# Patient Record
Sex: Male | Born: 1960
Health system: Southern US, Community
[De-identification: ages and names within clinical notes are randomized; demographics above are authoritative.]

## PROBLEM LIST (undated history)

## (undated) DIAGNOSIS — T7840XA Allergy, unspecified, initial encounter: Secondary | ICD-10-CM

## (undated) DIAGNOSIS — K6289 Other specified diseases of anus and rectum: Secondary | ICD-10-CM

## (undated) DIAGNOSIS — E785 Hyperlipidemia, unspecified: Secondary | ICD-10-CM

## (undated) DIAGNOSIS — N400 Enlarged prostate without lower urinary tract symptoms: Secondary | ICD-10-CM

## (undated) DIAGNOSIS — G709 Myoneural disorder, unspecified: Secondary | ICD-10-CM

## (undated) DIAGNOSIS — G4730 Sleep apnea, unspecified: Secondary | ICD-10-CM

## (undated) DIAGNOSIS — I10 Essential (primary) hypertension: Secondary | ICD-10-CM

## (undated) HISTORY — DX: Benign prostatic hyperplasia without lower urinary tract symptoms: N40.0

## (undated) HISTORY — DX: Hyperlipidemia, unspecified: E78.5

## (undated) HISTORY — DX: Essential (primary) hypertension: I10

## (undated) HISTORY — PX: POLYPECTOMY: SHX149

## (undated) HISTORY — DX: Myoneural disorder, unspecified: G70.9

## (undated) HISTORY — DX: Sleep apnea, unspecified: G47.30

## (undated) HISTORY — DX: Allergy, unspecified, initial encounter: T78.40XA

## (undated) HISTORY — DX: Other specified diseases of anus and rectum: K62.89

## (undated) HISTORY — PX: CIRCUMCISION: SUR203

---

## 1999-09-10 ENCOUNTER — Encounter: Payer: Self-pay | Admitting: Emergency Medicine

## 1999-09-10 ENCOUNTER — Emergency Department (HOSPITAL_COMMUNITY): Admission: EM | Admit: 1999-09-10 | Discharge: 1999-09-10 | Payer: Self-pay

## 2000-02-05 ENCOUNTER — Encounter: Admission: RE | Admit: 2000-02-05 | Discharge: 2000-02-05 | Payer: Self-pay | Admitting: General Practice

## 2000-02-05 ENCOUNTER — Encounter: Payer: Self-pay | Admitting: General Practice

## 2000-05-06 ENCOUNTER — Emergency Department (HOSPITAL_COMMUNITY): Admission: EM | Admit: 2000-05-06 | Discharge: 2000-05-06 | Payer: Self-pay | Admitting: Emergency Medicine

## 2000-05-22 ENCOUNTER — Encounter: Admission: RE | Admit: 2000-05-22 | Discharge: 2000-05-22 | Payer: Self-pay | Admitting: Family Medicine

## 2000-05-22 ENCOUNTER — Encounter: Payer: Self-pay | Admitting: Family Medicine

## 2000-06-18 ENCOUNTER — Encounter: Admission: RE | Admit: 2000-06-18 | Discharge: 2000-06-18 | Payer: Self-pay | Admitting: Family Medicine

## 2000-06-18 ENCOUNTER — Encounter: Payer: Self-pay | Admitting: Family Medicine

## 2000-06-30 ENCOUNTER — Encounter: Admission: RE | Admit: 2000-06-30 | Discharge: 2000-06-30 | Payer: Self-pay | Admitting: Family Medicine

## 2000-06-30 ENCOUNTER — Encounter: Payer: Self-pay | Admitting: Family Medicine

## 2000-09-22 ENCOUNTER — Encounter: Admission: RE | Admit: 2000-09-22 | Discharge: 2000-09-22 | Payer: Self-pay | Admitting: Family Medicine

## 2000-09-22 ENCOUNTER — Encounter: Payer: Self-pay | Admitting: Family Medicine

## 2001-03-09 ENCOUNTER — Emergency Department (HOSPITAL_COMMUNITY): Admission: EM | Admit: 2001-03-09 | Discharge: 2001-03-09 | Payer: Self-pay | Admitting: Emergency Medicine

## 2001-04-16 ENCOUNTER — Encounter: Payer: Self-pay | Admitting: Family Medicine

## 2001-04-16 ENCOUNTER — Ambulatory Visit (HOSPITAL_COMMUNITY): Admission: RE | Admit: 2001-04-16 | Discharge: 2001-04-16 | Payer: Self-pay | Admitting: Family Medicine

## 2002-05-07 ENCOUNTER — Emergency Department (HOSPITAL_COMMUNITY): Admission: EM | Admit: 2002-05-07 | Discharge: 2002-05-07 | Payer: Self-pay | Admitting: *Deleted

## 2002-05-12 ENCOUNTER — Encounter: Admission: RE | Admit: 2002-05-12 | Discharge: 2002-05-12 | Payer: Self-pay | Admitting: Sports Medicine

## 2002-06-09 ENCOUNTER — Encounter: Admission: RE | Admit: 2002-06-09 | Discharge: 2002-06-09 | Payer: Self-pay | Admitting: Family Medicine

## 2002-07-14 ENCOUNTER — Encounter: Admission: RE | Admit: 2002-07-14 | Discharge: 2002-07-14 | Payer: Self-pay | Admitting: Sports Medicine

## 2002-07-23 ENCOUNTER — Encounter: Admission: RE | Admit: 2002-07-23 | Discharge: 2002-07-23 | Payer: Self-pay | Admitting: Family Medicine

## 2002-10-13 ENCOUNTER — Encounter: Admission: RE | Admit: 2002-10-13 | Discharge: 2002-10-13 | Payer: Self-pay | Admitting: *Deleted

## 2003-06-29 ENCOUNTER — Encounter: Admission: RE | Admit: 2003-06-29 | Discharge: 2003-06-29 | Payer: Self-pay | Admitting: Family Medicine

## 2003-08-16 ENCOUNTER — Ambulatory Visit (HOSPITAL_COMMUNITY): Admission: RE | Admit: 2003-08-16 | Discharge: 2003-08-16 | Payer: Self-pay | Admitting: *Deleted

## 2003-08-16 ENCOUNTER — Encounter (INDEPENDENT_AMBULATORY_CARE_PROVIDER_SITE_OTHER): Payer: Self-pay | Admitting: Specialist

## 2003-12-21 ENCOUNTER — Encounter: Admission: RE | Admit: 2003-12-21 | Discharge: 2003-12-21 | Payer: Self-pay | Admitting: Emergency Medicine

## 2004-04-09 ENCOUNTER — Ambulatory Visit (HOSPITAL_COMMUNITY): Admission: RE | Admit: 2004-04-09 | Discharge: 2004-04-09 | Payer: Self-pay | Admitting: Sports Medicine

## 2004-05-24 ENCOUNTER — Emergency Department (HOSPITAL_COMMUNITY): Admission: EM | Admit: 2004-05-24 | Discharge: 2004-05-24 | Payer: Self-pay | Admitting: Emergency Medicine

## 2005-01-24 ENCOUNTER — Ambulatory Visit (HOSPITAL_COMMUNITY): Admission: RE | Admit: 2005-01-24 | Discharge: 2005-01-24 | Payer: Self-pay | Admitting: Urology

## 2005-03-27 ENCOUNTER — Ambulatory Visit: Payer: Self-pay | Admitting: Family Medicine

## 2005-03-27 ENCOUNTER — Encounter: Admission: RE | Admit: 2005-03-27 | Discharge: 2005-03-27 | Payer: Self-pay | Admitting: Sports Medicine

## 2005-04-08 HISTORY — PX: HEMORRHOID SURGERY: SHX153

## 2005-04-09 ENCOUNTER — Ambulatory Visit: Payer: Self-pay | Admitting: Sports Medicine

## 2005-05-28 ENCOUNTER — Ambulatory Visit: Payer: Self-pay | Admitting: Sports Medicine

## 2005-06-25 ENCOUNTER — Encounter: Admission: RE | Admit: 2005-06-25 | Discharge: 2005-07-22 | Payer: Self-pay | Admitting: Sports Medicine

## 2005-08-22 ENCOUNTER — Emergency Department (HOSPITAL_COMMUNITY): Admission: EM | Admit: 2005-08-22 | Discharge: 2005-08-22 | Payer: Self-pay | Admitting: Family Medicine

## 2005-08-27 ENCOUNTER — Ambulatory Visit: Payer: Self-pay | Admitting: Sports Medicine

## 2005-08-28 ENCOUNTER — Encounter: Admission: RE | Admit: 2005-08-28 | Discharge: 2005-08-28 | Payer: Self-pay | Admitting: Sports Medicine

## 2005-08-30 ENCOUNTER — Ambulatory Visit (HOSPITAL_COMMUNITY): Admission: RE | Admit: 2005-08-30 | Discharge: 2005-08-30 | Payer: Self-pay | Admitting: General Surgery

## 2005-08-30 ENCOUNTER — Encounter (INDEPENDENT_AMBULATORY_CARE_PROVIDER_SITE_OTHER): Payer: Self-pay | Admitting: Specialist

## 2005-09-07 ENCOUNTER — Inpatient Hospital Stay (HOSPITAL_COMMUNITY): Admission: EM | Admit: 2005-09-07 | Discharge: 2005-09-09 | Payer: Self-pay | Admitting: Emergency Medicine

## 2005-10-20 ENCOUNTER — Emergency Department (HOSPITAL_COMMUNITY): Admission: EM | Admit: 2005-10-20 | Discharge: 2005-10-20 | Payer: Self-pay | Admitting: Emergency Medicine

## 2005-12-31 ENCOUNTER — Ambulatory Visit: Payer: Self-pay | Admitting: Gastroenterology

## 2006-01-31 ENCOUNTER — Ambulatory Visit: Payer: Self-pay | Admitting: Gastroenterology

## 2006-01-31 ENCOUNTER — Encounter (INDEPENDENT_AMBULATORY_CARE_PROVIDER_SITE_OTHER): Payer: Self-pay | Admitting: *Deleted

## 2006-02-13 ENCOUNTER — Ambulatory Visit: Payer: Self-pay | Admitting: Gastroenterology

## 2006-03-21 ENCOUNTER — Ambulatory Visit: Payer: Self-pay | Admitting: Gastroenterology

## 2006-05-02 ENCOUNTER — Encounter: Admission: RE | Admit: 2006-05-02 | Discharge: 2006-05-02 | Payer: Self-pay | Admitting: Otolaryngology

## 2006-06-05 DIAGNOSIS — K219 Gastro-esophageal reflux disease without esophagitis: Secondary | ICD-10-CM | POA: Insufficient documentation

## 2006-07-15 ENCOUNTER — Ambulatory Visit: Payer: Self-pay | Admitting: Gastroenterology

## 2006-07-29 ENCOUNTER — Ambulatory Visit: Payer: Self-pay | Admitting: Gastroenterology

## 2006-10-03 ENCOUNTER — Encounter: Admission: RE | Admit: 2006-10-03 | Discharge: 2006-10-03 | Payer: Self-pay | Admitting: General Surgery

## 2007-04-09 HISTORY — PX: COLONOSCOPY: SHX174

## 2007-04-09 HISTORY — PX: UPPER GASTROINTESTINAL ENDOSCOPY: SHX188

## 2007-05-21 ENCOUNTER — Telehealth: Payer: Self-pay | Admitting: *Deleted

## 2007-05-22 ENCOUNTER — Ambulatory Visit: Payer: Self-pay | Admitting: Family Medicine

## 2007-05-22 DIAGNOSIS — R300 Dysuria: Secondary | ICD-10-CM | POA: Insufficient documentation

## 2007-05-22 LAB — CONVERTED CEMR LAB
Bilirubin Urine: NEGATIVE
Blood in Urine, dipstick: NEGATIVE
Glucose, Urine, Semiquant: NEGATIVE
Ketones, urine, test strip: NEGATIVE
Nitrite: NEGATIVE
Protein, U semiquant: NEGATIVE
Specific Gravity, Urine: 1.015
Urobilinogen, UA: 0.2
WBC Urine, dipstick: NEGATIVE
pH: 6.5

## 2007-05-29 ENCOUNTER — Ambulatory Visit: Payer: Self-pay | Admitting: Gastroenterology

## 2007-05-29 LAB — CONVERTED CEMR LAB
BUN: 8 mg/dL (ref 6–23)
Basophils Absolute: 0 10*3/uL (ref 0.0–0.1)
Basophils Relative: 0 % (ref 0.0–1.0)
CO2: 30 meq/L (ref 19–32)
Calcium: 9.4 mg/dL (ref 8.4–10.5)
Chloride: 105 meq/L (ref 96–112)
Creatinine, Ser: 0.8 mg/dL (ref 0.4–1.5)
Eosinophils Absolute: 0.1 10*3/uL (ref 0.0–0.6)
Eosinophils Relative: 2.3 % (ref 0.0–5.0)
Ferritin: 55.7 ng/mL (ref 22.0–322.0)
Folate: 19.3 ng/mL
GFR calc Af Amer: 133 mL/min
GFR calc non Af Amer: 110 mL/min
Glucose, Bld: 86 mg/dL (ref 70–99)
HCT: 46.7 % (ref 39.0–52.0)
Hemoglobin: 15.3 g/dL (ref 13.0–17.0)
Iron: 80 ug/dL (ref 42–165)
Lymphocytes Relative: 41.3 % (ref 12.0–46.0)
MCHC: 32.8 g/dL (ref 30.0–36.0)
MCV: 85.7 fL (ref 78.0–100.0)
Monocytes Absolute: 0.6 10*3/uL (ref 0.2–0.7)
Monocytes Relative: 9.1 % (ref 3.0–11.0)
Neutro Abs: 2.9 10*3/uL (ref 1.4–7.7)
Neutrophils Relative %: 47.3 % (ref 43.0–77.0)
Platelets: 296 10*3/uL (ref 150–400)
Potassium: 4.5 meq/L (ref 3.5–5.1)
RBC: 5.45 M/uL (ref 4.22–5.81)
RDW: 12.8 % (ref 11.5–14.6)
Saturation Ratios: 21.7 % (ref 20.0–50.0)
Sed Rate: 4 mm/hr (ref 0–20)
Sodium: 139 meq/L (ref 135–145)
TSH: 0.93 microintl units/mL (ref 0.35–5.50)
Transferrin: 263.5 mg/dL (ref >=212.0)
Vitamin B-12: 391 pg/mL (ref 211–911)
WBC: 6.2 10*3/uL (ref 4.5–10.5)

## 2007-06-01 LAB — CONVERTED CEMR LAB
ALT: 18 units/L (ref 0–53)
AST: 25 units/L (ref 0–37)
Albumin: 4.3 g/dL (ref 3.5–5.2)
Alkaline Phosphatase: 51 units/L (ref 39–117)
BUN: 8 mg/dL (ref 6–23)
Basophils Absolute: 0 10*3/uL (ref 0.0–0.1)
Basophils Relative: 0 % (ref 0.0–1.0)
Bilirubin, Direct: 0.2 mg/dL (ref 0.0–0.3)
CO2: 30 meq/L (ref 19–32)
Calcium: 9.4 mg/dL (ref 8.4–10.5)
Chloride: 105 meq/L (ref 96–112)
Creatinine, Ser: 0.8 mg/dL (ref 0.4–1.5)
Eosinophils Absolute: 0.1 10*3/uL (ref 0.0–0.6)
Eosinophils Relative: 2.3 % (ref 0.0–5.0)
Ferritin: 55.7 ng/mL (ref 22.0–322.0)
Folate: 19.3 ng/mL
GFR calc Af Amer: 133 mL/min
GFR calc non Af Amer: 110 mL/min
Glucose, Bld: 86 mg/dL (ref 70–99)
HCT: 46.7 % (ref 39.0–52.0)
Hemoglobin: 15.3 g/dL (ref 13.0–17.0)
Iron: 80 ug/dL (ref 42–165)
Lymphocytes Relative: 41.3 % (ref 12.0–46.0)
MCHC: 32.8 g/dL (ref 30.0–36.0)
MCV: 85.7 fL (ref 78.0–100.0)
Monocytes Absolute: 0.6 10*3/uL (ref 0.2–0.7)
Monocytes Relative: 9.1 % (ref 3.0–11.0)
Neutro Abs: 2.9 10*3/uL (ref 1.4–7.7)
Neutrophils Relative %: 47.3 % (ref 43.0–77.0)
Platelets: 296 10*3/uL (ref 150–400)
Potassium: 4.5 meq/L (ref 3.5–5.1)
RBC: 5.45 M/uL (ref 4.22–5.81)
RDW: 12.8 % (ref 11.5–14.6)
Saturation Ratios: 21.7 % (ref 20.0–50.0)
Sed Rate: 4 mm/hr (ref 0–20)
Sodium: 139 meq/L (ref 135–145)
TSH: 0.93 microintl units/mL (ref 0.35–5.50)
Total Bilirubin: 0.6 mg/dL (ref 0.3–1.2)
Total Protein: 7.3 g/dL (ref 6.0–8.3)
Transferrin: 263.5 mg/dL (ref >=212.0)
Vitamin B-12: 391 pg/mL (ref 211–911)
WBC: 6.2 10*3/uL (ref 4.5–10.5)

## 2007-06-11 ENCOUNTER — Emergency Department (HOSPITAL_COMMUNITY): Admission: EM | Admit: 2007-06-11 | Discharge: 2007-06-12 | Payer: Self-pay | Admitting: Emergency Medicine

## 2007-06-16 ENCOUNTER — Ambulatory Visit: Payer: Self-pay | Admitting: Family Medicine

## 2007-06-16 ENCOUNTER — Telehealth: Payer: Self-pay | Admitting: *Deleted

## 2007-06-16 LAB — CONVERTED CEMR LAB
Bilirubin Urine: NEGATIVE
Blood in Urine, dipstick: NEGATIVE
Glucose, Urine, Semiquant: NEGATIVE
Ketones, urine, test strip: NEGATIVE
Nitrite: NEGATIVE
Protein, U semiquant: NEGATIVE
Specific Gravity, Urine: <1.005
Urobilinogen, UA: 0.2
WBC Urine, dipstick: NEGATIVE
pH: 6.5

## 2007-06-26 ENCOUNTER — Encounter: Payer: Self-pay | Admitting: Gastroenterology

## 2007-06-26 ENCOUNTER — Ambulatory Visit: Payer: Self-pay | Admitting: Gastroenterology

## 2007-06-30 ENCOUNTER — Ambulatory Visit (HOSPITAL_COMMUNITY): Admission: RE | Admit: 2007-06-30 | Discharge: 2007-06-30 | Payer: Self-pay | Admitting: Gastroenterology

## 2007-07-16 ENCOUNTER — Ambulatory Visit: Payer: Self-pay | Admitting: Gastroenterology

## 2007-07-23 ENCOUNTER — Ambulatory Visit (HOSPITAL_COMMUNITY): Admission: RE | Admit: 2007-07-23 | Discharge: 2007-07-23 | Payer: Self-pay | Admitting: Gastroenterology

## 2007-07-26 ENCOUNTER — Encounter: Payer: Self-pay | Admitting: Gastroenterology

## 2007-07-29 ENCOUNTER — Ambulatory Visit: Payer: Self-pay | Admitting: Gastroenterology

## 2007-08-06 ENCOUNTER — Ambulatory Visit: Payer: Self-pay | Admitting: Family Medicine

## 2007-08-08 ENCOUNTER — Emergency Department (HOSPITAL_COMMUNITY): Admission: EM | Admit: 2007-08-08 | Discharge: 2007-08-08 | Payer: Self-pay | Admitting: Emergency Medicine

## 2007-09-28 ENCOUNTER — Telehealth: Payer: Self-pay | Admitting: *Deleted

## 2007-09-28 ENCOUNTER — Emergency Department (HOSPITAL_COMMUNITY): Admission: EM | Admit: 2007-09-28 | Discharge: 2007-09-28 | Payer: Self-pay | Admitting: Emergency Medicine

## 2007-12-03 ENCOUNTER — Telehealth: Payer: Self-pay | Admitting: *Deleted

## 2007-12-04 ENCOUNTER — Ambulatory Visit: Payer: Self-pay | Admitting: Family Medicine

## 2007-12-07 DIAGNOSIS — K649 Unspecified hemorrhoids: Secondary | ICD-10-CM | POA: Insufficient documentation

## 2007-12-07 DIAGNOSIS — R198 Other specified symptoms and signs involving the digestive system and abdomen: Secondary | ICD-10-CM | POA: Insufficient documentation

## 2007-12-08 ENCOUNTER — Ambulatory Visit: Payer: Self-pay | Admitting: Gastroenterology

## 2007-12-08 DIAGNOSIS — K6289 Other specified diseases of anus and rectum: Secondary | ICD-10-CM | POA: Insufficient documentation

## 2007-12-08 HISTORY — DX: Other specified diseases of anus and rectum: K62.89

## 2007-12-09 ENCOUNTER — Telehealth: Payer: Self-pay | Admitting: Gastroenterology

## 2008-01-20 ENCOUNTER — Telehealth: Payer: Self-pay | Admitting: Gastroenterology

## 2008-01-25 ENCOUNTER — Encounter: Payer: Self-pay | Admitting: Gastroenterology

## 2008-01-25 ENCOUNTER — Ambulatory Visit: Payer: Self-pay | Admitting: Gastroenterology

## 2008-01-27 ENCOUNTER — Encounter: Payer: Self-pay | Admitting: Gastroenterology

## 2008-02-12 ENCOUNTER — Telehealth (INDEPENDENT_AMBULATORY_CARE_PROVIDER_SITE_OTHER): Payer: Self-pay | Admitting: *Deleted

## 2008-02-14 ENCOUNTER — Emergency Department (HOSPITAL_COMMUNITY): Admission: EM | Admit: 2008-02-14 | Discharge: 2008-02-14 | Payer: Self-pay | Admitting: Emergency Medicine

## 2009-01-26 ENCOUNTER — Encounter: Payer: Self-pay | Admitting: Family Medicine

## 2009-01-26 ENCOUNTER — Ambulatory Visit: Payer: Self-pay | Admitting: Family Medicine

## 2009-01-26 DIAGNOSIS — M129 Arthropathy, unspecified: Secondary | ICD-10-CM | POA: Insufficient documentation

## 2009-01-30 LAB — CONVERTED CEMR LAB
BUN: 11 mg/dL (ref 6–23)
Basophils Absolute: 0.1 10*3/uL (ref 0.0–0.1)
Basophils Relative: 1 % (ref 0–1)
CO2: 20 meq/L (ref 19–32)
Calcium: 9.3 mg/dL (ref 8.4–10.5)
Chloride: 106 meq/L (ref 96–112)
Creatinine, Ser: 0.92 mg/dL (ref 0.40–1.50)
Eosinophils Absolute: 0.2 10*3/uL (ref 0.0–0.7)
Eosinophils Relative: 2 % (ref 0–5)
Glucose, Bld: 90 mg/dL (ref 70–99)
HCT: 44.8 % (ref 39.0–52.0)
Hemoglobin: 14.7 g/dL (ref 13.0–17.0)
Lymphocytes Relative: 43 % (ref 12–46)
Lymphs Abs: 3.1 10*3/uL (ref 0.7–4.0)
MCHC: 32.8 g/dL (ref 30.0–36.0)
MCV: 86.8 fL (ref 78.0–100.0)
Monocytes Absolute: 0.8 10*3/uL (ref 0.1–1.0)
Monocytes Relative: 11 % (ref 3–12)
Neutro Abs: 3.1 10*3/uL (ref 1.7–7.7)
Neutrophils Relative %: 44 % (ref 43–77)
Platelets: 272 10*3/uL (ref 150–400)
Potassium: 4 meq/L (ref 3.5–5.3)
RBC: 5.16 M/uL (ref 4.22–5.81)
RDW: 13.9 % (ref 11.5–15.5)
Sodium: 141 meq/L (ref 135–145)
TSH: 1.108 microintl units/mL (ref 0.350–4.500)
WBC: 7.2 10*3/uL (ref 4.0–10.5)

## 2009-03-09 ENCOUNTER — Encounter: Payer: Self-pay | Admitting: Family Medicine

## 2009-03-09 ENCOUNTER — Ambulatory Visit: Payer: Self-pay | Admitting: Family Medicine

## 2009-03-09 LAB — CONVERTED CEMR LAB
Bilirubin Urine: NEGATIVE
Blood in Urine, dipstick: NEGATIVE
Chlamydia, Swab/Urine, PCR: NEGATIVE
GC Probe Amp, Urine: NEGATIVE
Glucose, Urine, Semiquant: NEGATIVE
Ketones, urine, test strip: NEGATIVE
Nitrite: NEGATIVE
Protein, U semiquant: NEGATIVE
Specific Gravity, Urine: 1.02
Urobilinogen, UA: 0.2
WBC Urine, dipstick: NEGATIVE
pH: 7

## 2009-03-10 ENCOUNTER — Encounter: Payer: Self-pay | Admitting: Family Medicine

## 2009-03-17 IMAGING — CR DG CHEST 2V
2 series · 2 of 2 positions shown · non-contrast
Comparison: None

CLINICAL DATA: Left upper back pain.

CHEST - 2 VIEW

[w chest pa]
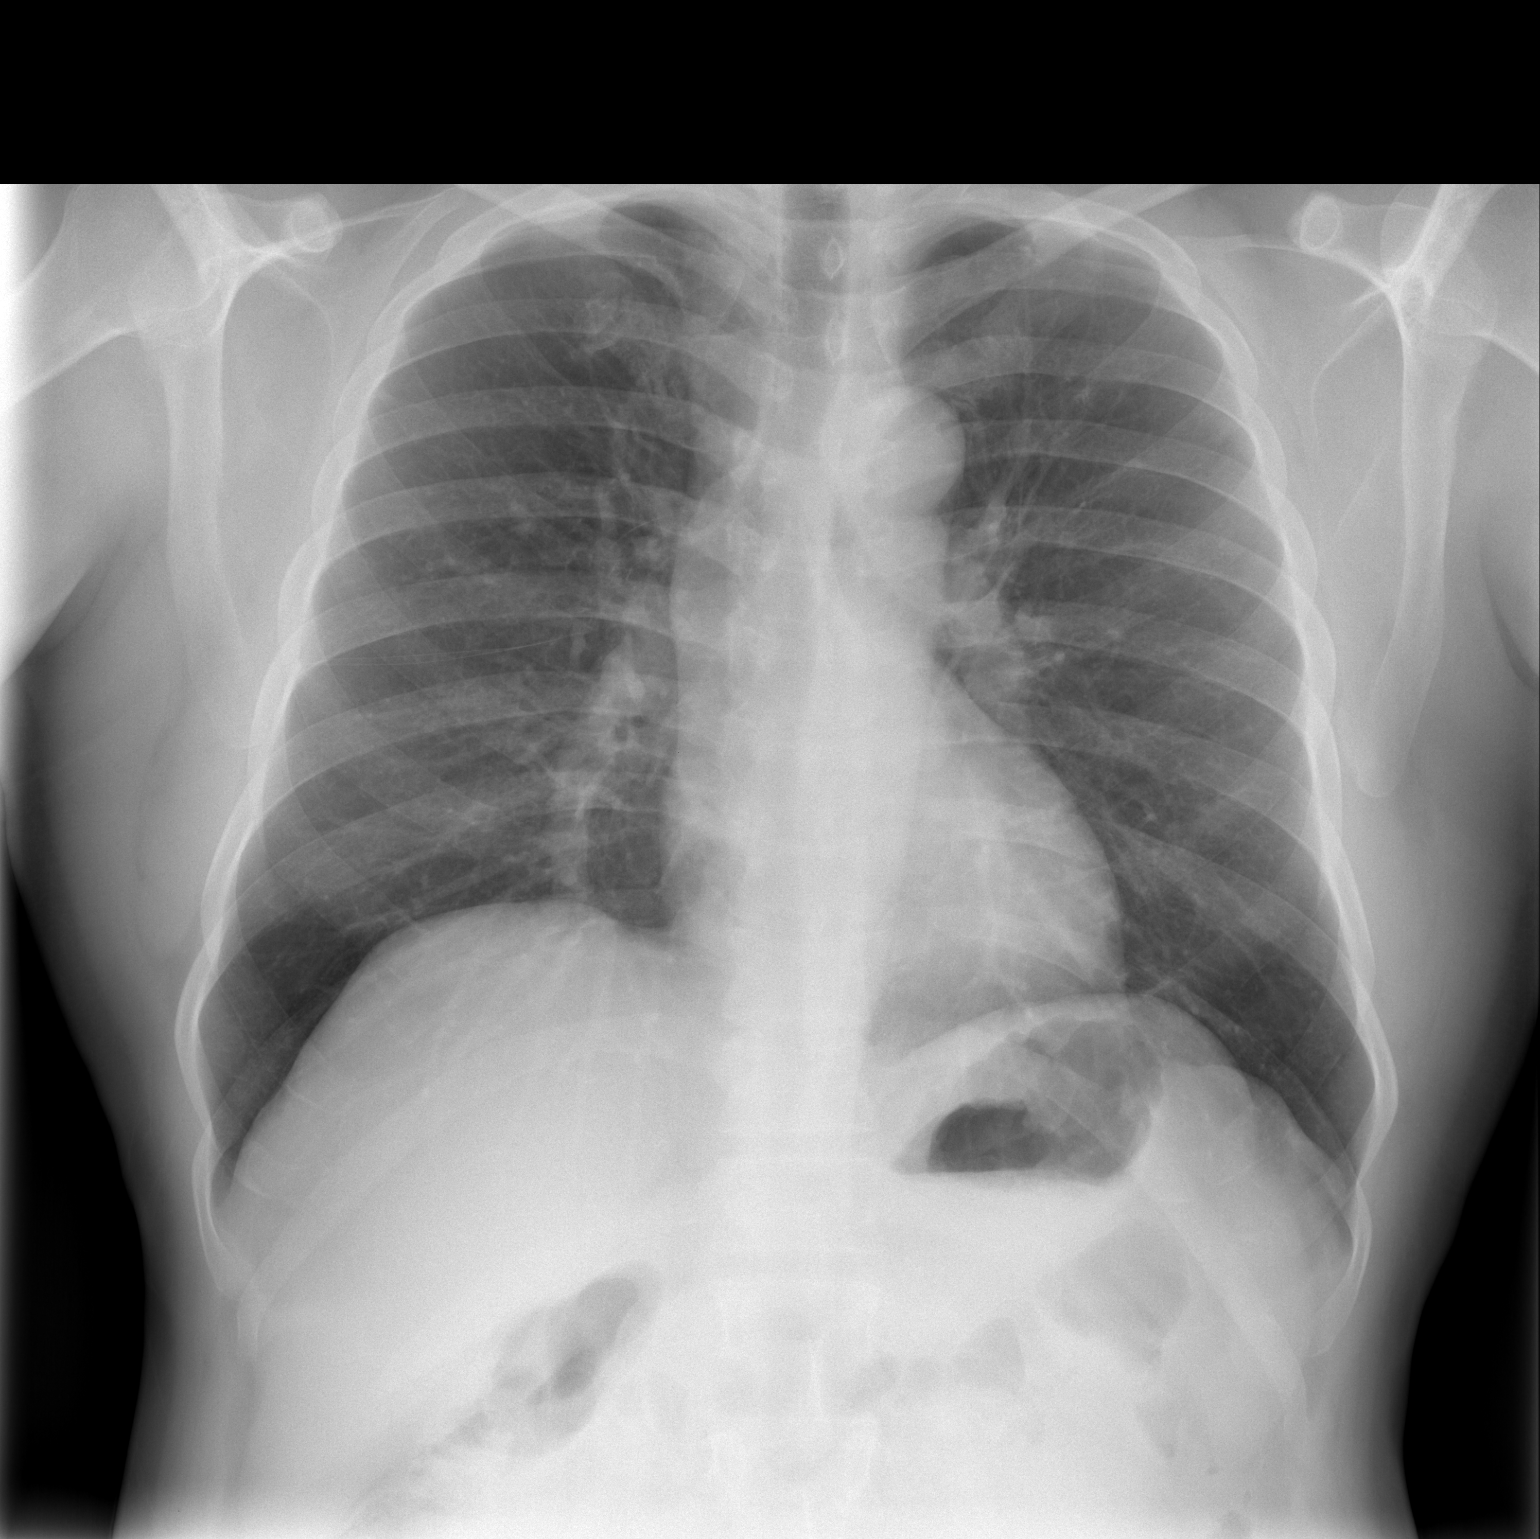

[w chest lat]
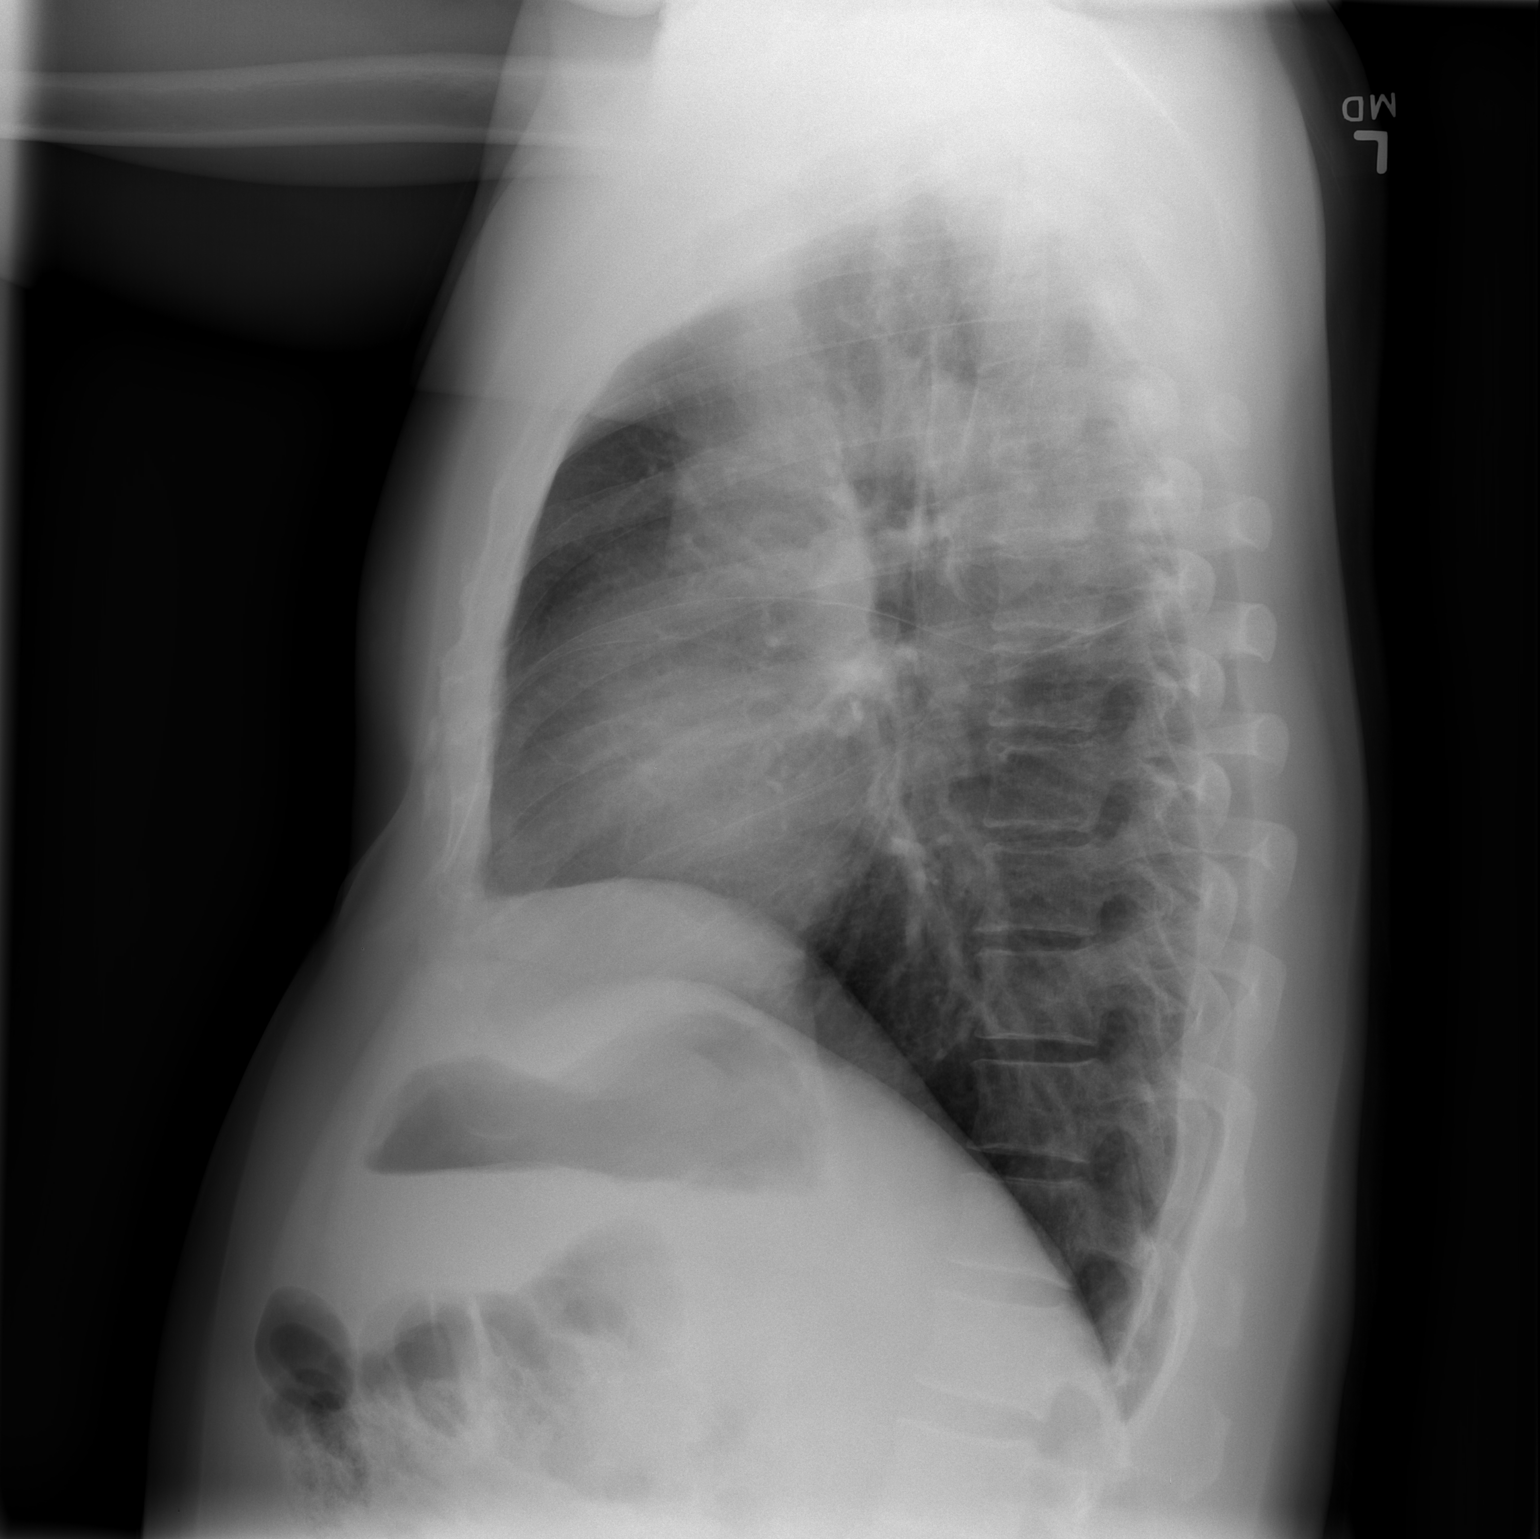

[2 of 2 positions shown; findings below may reference images not displayed]

FINDINGS: Cardiomediastinal silhouette is within normal limits.
Lungs are free of focal consolidations and pleural effusions.
Visualized bones have normal appearance.
IMPRESSION: No evidence for acute abnormality.

## 2009-03-27 ENCOUNTER — Ambulatory Visit: Payer: Self-pay | Admitting: Family Medicine

## 2009-03-27 DIAGNOSIS — N401 Enlarged prostate with lower urinary tract symptoms: Secondary | ICD-10-CM | POA: Insufficient documentation

## 2009-04-14 ENCOUNTER — Ambulatory Visit: Payer: Self-pay | Admitting: Family Medicine

## 2009-04-14 DIAGNOSIS — I10 Essential (primary) hypertension: Secondary | ICD-10-CM | POA: Insufficient documentation

## 2009-05-08 ENCOUNTER — Ambulatory Visit: Payer: Self-pay | Admitting: Family Medicine

## 2009-05-08 LAB — CONVERTED CEMR LAB
Cholesterol: 167 mg/dL (ref 0–200)
HDL: 34 mg/dL — ABNORMAL LOW (ref >39)
LDL Cholesterol: 95 mg/dL (ref 0–99)
PSA: 1.33 ng/mL (ref 0.10–4.00)
Total CHOL/HDL Ratio: 4.9
Triglycerides: 191 mg/dL — ABNORMAL HIGH (ref <150)
VLDL: 38 mg/dL (ref 0–40)

## 2009-06-12 ENCOUNTER — Ambulatory Visit: Payer: Self-pay | Admitting: Family Medicine

## 2009-07-17 ENCOUNTER — Ambulatory Visit: Payer: Self-pay | Admitting: Family Medicine

## 2009-07-18 ENCOUNTER — Encounter: Payer: Self-pay | Admitting: Family Medicine

## 2009-09-07 ENCOUNTER — Telehealth: Payer: Self-pay | Admitting: Family Medicine

## 2009-11-02 ENCOUNTER — Ambulatory Visit: Payer: Self-pay | Admitting: Family Medicine

## 2009-11-02 DIAGNOSIS — R109 Unspecified abdominal pain: Secondary | ICD-10-CM | POA: Insufficient documentation

## 2009-11-02 LAB — CONVERTED CEMR LAB
Bilirubin Urine: NEGATIVE
Blood in Urine, dipstick: NEGATIVE
Glucose, Urine, Semiquant: NEGATIVE
HCT: 45 % (ref 39.0–52.0)
Hemoglobin: 14.9 g/dL (ref 13.0–17.0)
Ketones, urine, test strip: NEGATIVE
MCHC: 33.1 g/dL (ref 30.0–36.0)
MCV: 86.2 fL (ref 78.0–100.0)
Nitrite: NEGATIVE
Platelets: 300 10*3/uL (ref 150–400)
Protein, U semiquant: NEGATIVE
RBC: 5.22 M/uL (ref 4.22–5.81)
RDW: 14.1 % (ref 11.5–15.5)
Specific Gravity, Urine: 1.01
Urobilinogen, UA: 0.2
WBC Urine, dipstick: NEGATIVE
WBC: 7.2 10*3/uL (ref 4.0–10.5)
pH: 6

## 2009-11-21 ENCOUNTER — Telehealth: Payer: Self-pay | Admitting: Family Medicine

## 2010-04-25 ENCOUNTER — Ambulatory Visit
Admission: RE | Admit: 2010-04-25 | Discharge: 2010-04-25 | Payer: Self-pay | Source: Home / Self Care | Attending: Family Medicine | Admitting: Family Medicine

## 2010-04-25 ENCOUNTER — Telehealth: Payer: Self-pay | Admitting: Family Medicine

## 2010-04-26 ENCOUNTER — Encounter: Payer: Self-pay | Admitting: Family Medicine

## 2010-04-28 ENCOUNTER — Encounter: Payer: Self-pay | Admitting: Urology

## 2010-05-08 ENCOUNTER — Encounter: Payer: Self-pay | Admitting: Family Medicine

## 2010-05-08 NOTE — Assessment & Plan Note (Signed)
Summary: KH   Vital Signs:  Patient profile:   50 year old male Height:      66.5 inches Weight:      175.5 pounds BMI:     28.00 Temp:     97.6 degrees F oral Pulse rate:   70 / minute BP sitting:   145 / 89  (right arm) Cuff size:   large  Vitals Entered By: Garen Grams LPN (May 08, 2009 8:49 AM) CC: f/u BP, BPH Is Patient Diabetic? No Pain Assessment Patient in pain? no        Primary Care Provider:  [facility]  CC:  f/u BP and BPH.  History of Present Illness:  Dysuria Has had for years.  Has uncontrollable urge where he will have to stop and go small amounts immediately.  Recent alpha blocker helped some at first but now is back to usual symptoms.  Gets up many times at PM.  Sometimes notices weaker stream.   No blood, no fever, chronic low back pain.    HYPERTENSION Disease Monitoring   Blood pressure range:not checking       Chest pain: N     Dyspnea:N Medications   Compliance: daily cardura   Lightheadedness: N     Edema:n  ROS - as above PMH - Medications reviewed and updated in medication list.  Smoking Status noted in VS form    Habits & Providers  Alcohol-Tobacco-Diet     Tobacco Status: never  Current Medications (verified): 1)  Cardura 4 Mg Tabs (Doxazosin Mesylate) .... One Qhs 2)  Aspirin 81 Mg Tbec (Aspirin) .... One Daily  Allergies: No Known Drug Allergies  Social History: Smoking Status:  never  Physical Exam  General:  Well-developed,well-nourished,in no acute distress; alert,appropriate and cooperative throughout examination Genitalia:  shaved genitalia.  No masses does have varioceles.  Rectal firm tissue anteriorly.  No prostate masses    Impression & Recommendations:  Problem # 1:  DYSURIA (ICD-788.1)  he has chronic complex symptoms somewhat suggestive of BPH and urge incontinence.  Has see a urologist in past and been tried on several medicitions but does not remember ever having urodynamics.  The  alpha blocker has not seemed to help much.  Despite his lack of insurance I don't think we can treat him effectively without a better exam and diagnositics. Check PSA.  Will refer to urology  Orders: [facility]- Est  Level 4 ([address]) Urology Referral (Urology)  Problem # 2:  HYPERTENSION, BENIGN ESSENTIAL (ICD-401.1)  No well controlled.  With history of sudden death will treat aggresively.  Stop alpha and start Acei and monitor  The following medications were removed from the medication list:    Cardura 4 Mg Tabs (Doxazosin mesylate) ..... One qhs His updated medication list for this problem includes:    Enalapril Maleate 10 Mg Tabs (Enalapril maleate) .Marland Kitchen... Take 1 tab by mouth daily  BP today: 145/89 Prior BP: 153/89 (04/14/2009)  Labs Reviewed: K+: 4.0 (01/26/2009) Creat: : 0.92 (01/26/2009)     Orders: [facility]- Est  Level 4 ([address])  Complete Medication List: 1)  Aspirin 81 Mg Tbec (Aspirin) .... One daily 2)  Enalapril Maleate 10 Mg Tabs (Enalapril maleate) .... Take 1 tab by mouth daily  Other Orders: Lipid-[facility] ([address]-91478) PSA-[facility] ([id number])  Patient Instructions: 1)  Please schedule a follow-up appointment in 1 month.  2)  I will call you if your lab is abnormal otherwise I will send you a letter within 2  weeks. 3)  We will call you with an appointment to a Urologist 4)  The new medicine for your blood pressure is Enalapril - take one a day.  If you get any swelling of your face stop it and call us  5)  You will need a blood test in 4 weeks after you start this 6)  Check your blood pressure 1-2 x a week write down and bring in when you come back Prescriptions: ENALAPRIL MALEATE 10 MG  TABS (ENALAPRIL MALEATE) Take 1 tab by mouth daily  #30 x 3   Entered and Authorized by:   Pearlean Brownie MD   Signed by:   Pearlean Brownie MD on 05/08/2009   Method used:   Print then Give to Patient   RxID:   [id number]

## 2010-05-08 NOTE — Assessment & Plan Note (Signed)
Summary: F/U VISIT/[facility]   Vital Signs:  Patient profile:   50 year old male Height:      66.5 inches Weight:      174 pounds BMI:     27.76 Temp:     97.8 degrees F oral Pulse rate:   73 / minute BP sitting:   129 / 76  (left arm) Cuff size:   regular  Vitals Entered By: Garen Grams LPN (July 17, 2009 4:10 PM) CC: f/u dysuria Is Patient Diabetic? No Pain Assessment Patient in pain? yes     Location: stomach   Primary Care Provider:  [facility]  CC:  f/u dysuria.  History of Present Illness:  Dysuria Continues with frequency and urge about the same.    No blood, no fever, chronic low back pain.    Now having frequency of bowel movements also.  No bleeding or nausea or vomiting  Also has pain in his rectal and testicular area from time to time. Brought in all his previous medications that he has tried.  These include Uroxatral, Detrol and Sanctura and Flomax.   He is exercising daily running up to 4 miles.  He feels well.  ROS - as above PMH - Medications reviewed and updated in medication list.  Smoking Status noted in VS form    Habits & Providers  Alcohol-Tobacco-Diet     Tobacco Status: never  Allergies: No Known Drug Allergies  Physical Exam  General:  Well-developed,well-nourished,in no acute distress; alert,appropriate and cooperative throughout examination   Impression & Recommendations:  Problem # 1:  DYSURIA (ICD-788.1) Assessment Unchanged  He has chronic dysuria and frequency and also freguent bowel movements up to three times a day. These symptoms are associated with pai in his rectal testicular area.  Has had work up by GI and Urology in the past and has no red flags by history or exam.   Will treat symptoms with desipramine and continue exercise  Orders: [facility]- Est Level  3 ([address])  Problem # 2:  HYPERTENSION, BENIGN ESSENTIAL (ICD-401.1) Assessment: Improved  His updated medication list for this problem includes:  Enalapril Maleate 10 Mg Tabs (Enalapril maleate) .Marland Kitchen... Take 1 tab by mouth daily  Complete Medication List: 1)  Aspirin 81 Mg Tbec (Aspirin) .... One daily 2)  Enalapril Maleate 10 Mg Tabs (Enalapril maleate) .... Take 1 tab by mouth daily 3)  Desipramine Hcl 25 Mg Tabs (Desipramine hcl) .Marland Kitchen.. 1 daily for 2 days then 2 daily for 3-4 days then 3 daily  Patient Instructions: 1)  Please schedule a follow-up appointment in 3 months .  2)  Try the desipramine slowly increasing until you get to 3 tablets a day for 3-4 weeks.  If helping some but not a lot then go up to 4 tablets a day Prescriptions: DESIPRAMINE HCL 25 MG TABS (DESIPRAMINE HCL) 1 daily for 2 days then 2 daily for 3-4 days then 3 daily  #90 x 1   Entered and Authorized by:   Pearlean Brownie MD   Signed by:   Pearlean Brownie MD on 07/17/2009   Method used:   Faxed to ...       [facility] [address] (retail)       [address]       [facility]       [place], [place]  [address]       Ph: [phone]       Fax: [phone]  RxID:   [id number]

## 2010-05-08 NOTE — Progress Notes (Signed)
Summary: Rx Req   Phone Note Refill Request Call back at Home Phone [phone] Message from:  Patient  Refills Requested: Medication #1:  ENALAPRIL MALEATE 10 MG  TABS Take 1 tab by mouth daily [facility].  Initial call taken by: Clydell Hakim,  November 21, 2009 3:33 PM  Follow-up for Phone Call        will forward to MD. Follow-up by: Theresia Lo RN,  November 21, 2009 3:42 PM    Prescriptions: ENALAPRIL MALEATE 10 MG  TABS (ENALAPRIL MALEATE) Take 1 tab by mouth daily  #30 x 9   Entered and Authorized by:   Pearlean Brownie MD   Signed by:   Pearlean Brownie MD on 11/21/2009   Method used:   Faxed to ...       [facility] [address] (retail)       [address]       [facility]       [place], [place]  [address]       Ph: [phone]       Fax: [phone]   RxID:   [id number]

## 2010-05-08 NOTE — Assessment & Plan Note (Signed)
Summary: f/u eo   Vital Signs:  Patient profile:   50 year old male Height:      66.5 inches Weight:      174 pounds BMI:     27.76 BSA:     1.90 Temp:     97.9 degrees F Pulse rate:   71 / minute BP sitting:   136 / 84  Vitals Entered By: Jone Baseman CMA (June 12, 2009 3:59 PM) CC: F/U frequecny Is Patient Diabetic? No Pain Assessment Patient in pain? no        Primary Care Provider:  [facility]  CC:  F/U frequecny.  History of Present Illness:  Dysuria Continues with frequency and urge.   No blood, no fever, chronic low back pain.    Now having frequency of bowel movements also.  No bleeding or nausea or vomiting   HYPERTENSION Disease Monitoring   Blood pressure range:not checking       Chest pain: N     Dyspnea:N Medications   Compliance: daily enalapril   Lightheadedness: N     Edema:n  ROS - as above PMH - Medications reviewed and updated in medication list.  Smoking Status noted in VS form    Habits & Providers  Alcohol-Tobacco-Diet     Tobacco Status: never  Allergies: No Known Drug Allergies  Social History: From [place];  No smoking or alcohol Occupation: Social research officer, government Illicit Drug Use - no Patient gets regular exercise. [employer] Contact # [phone] (home) Has son in mid school and daughter in elementary in 2011  Physical Exam  General:  Well-developed,well-nourished,in no acute distress; alert,appropriate and cooperative throughout examination Abdomen:  Bowel sounds positive,abdomen soft and non-tender without masses, organomegaly or hernias noted.   Impression & Recommendations:  Problem # 1:  DYSURIA (ICD-788.1) Assessment Unchanged  Ask him to bring in all his old records.  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  May consider TCAs to help.  Also discussed vigorous exercise and a way to help block irritating nerve impulses similar to  fibromyalgia  Orders: [facility]- Est Level  3 ([address])  Problem # 2:  HYPERTENSION, BENIGN ESSENTIAL (ICD-401.1) Assessment: Improved  better with ACEI.  He relates it makes him feel not good but he will continue to take it  His updated medication list for this problem includes:    Enalapril Maleate 10 Mg Tabs (Enalapril maleate) .Marland Kitchen... Take 1 tab by mouth daily  Orders: [facility]- Est Level  3 ([address])  Complete Medication List: 1)  Aspirin 81 Mg Tbec (Aspirin) .... One daily 2)  Enalapril Maleate 10 Mg Tabs (Enalapril maleate) .... Take 1 tab by mouth daily  Patient Instructions: 1)  Please schedule a follow-up appointment in 1 month.  2)  Bring in alll your old medicines and records 3)  Regular exercise 20-30 min every day  4)  Call if any fever or bleeding   Prevention & Chronic Care Immunizations   Influenza vaccine: Not documented    Tetanus booster: Not documented    Pneumococcal vaccine: Not documented  Other Screening   Smoking status: never  (06/12/2009)  Lipids   Total Cholesterol: 167  (05/08/2009)   LDL: 95  (05/08/2009)   LDL Direct: Not documented   HDL: 34  (05/08/2009)   Triglycerides: 191  (05/08/2009)  Hypertension   Last Blood Pressure: 136 / 84  (06/12/2009)   Serum creatinine: 0.92  (01/26/2009)  Serum potassium 4.0  (01/26/2009)    Hypertension flowsheet reviewed?: Yes   Progress toward BP goal: At goal  Self-Management Support :    Hypertension self-management support: Not documented    Hypertension self-management support not done because: Good outcomes  (06/12/2009)

## 2010-05-08 NOTE — Progress Notes (Signed)
Summary: Rx Ques   Phone Note Call from Patient Call back at [facility] Phone [phone]   Caller: Patient Summary of Call: Pt perscribed rx for bp meds, but wondering if he needs to keep taking it?  Uses [facility].  He is out since yesterday. Initial call taken by: Clydell Hakim,  September 07, 2009 3:17 PM    Prescriptions: ENALAPRIL MALEATE 10 MG  TABS (ENALAPRIL MALEATE) Take 1 tab by mouth daily  #30 x 1   Entered by:   Theresia Lo RN   Authorized by:   . RN TEAM-[facility]   Signed by:   Theresia Lo RN on 09/07/2009   Method used:   Telephoned to ...       [facility] (retail)             [place], [place]         Ph:        Fax: [phone]   RxID:   [id number]   patient has run out of enalapril and is wondering if he needs to  continue taking . advised yes, he just needs a new rx sent to pharmacy.[facility] pharmacy. rX called in so patient can go and get it now. advised patient that he needs appointment for follow up the first of July .  Theresia Lo RN  September 07, 2009 4:54 PM

## 2010-05-08 NOTE — Miscellaneous (Signed)
  Medications Added NORTRIPTYLINE HCL 25 MG CAPS (NORTRIPTYLINE HCL) 1 daily for 2 days then 2 daily for 3-4 days then 3 tabs daily       Clinical Lists Changes  Medications: Changed medication from DESIPRAMINE HCL 25 MG TABS (DESIPRAMINE HCL) 1 daily for 2 days then 2 daily for 3-4 days then 3 daily to NORTRIPTYLINE HCL 25 MG CAPS (NORTRIPTYLINE HCL) 1 daily for 2 days then 2 daily for 3-4 days then 3 tabs daily - Signed Rx of NORTRIPTYLINE HCL 25 MG CAPS (NORTRIPTYLINE HCL) 1 daily for 2 days then 2 daily for 3-4 days then 3 tabs daily;  #90 x 1;  Signed;  Entered by: Pearlean Brownie MD;  Authorized by: Pearlean Brownie MD;  Method used: Faxed to [facility], [address] [place], [place], [place]  [address], Ph: [phone], Fax: [phone]    Prescriptions: NORTRIPTYLINE HCL 25 MG CAPS (NORTRIPTYLINE HCL) 1 daily for 2 days then 2 daily for 3-4 days then 3 tabs daily  #90 x 1   Entered and Authorized by:   Pearlean Brownie MD   Signed by:   Pearlean Brownie MD on 07/18/2009   Method used:   Faxed to ...       [facility] (retail)       [address] [place], [place]  [address]       Ph: [phone]       Fax: [phone]   RxID:   [id number]

## 2010-05-08 NOTE — Assessment & Plan Note (Signed)
Summary: F/U V ISIT for urinary freq and HTN /[facility]   Vital Signs:  Patient profile:   50 year old male Weight:      176.5 pounds Pulse rate:   82 / minute BP sitting:   153 / 89  (right arm)  Vitals Entered By: Renato Battles slade,cma CC: headache x 3 days. back of head and eyes. worse in am's. Is Patient Diabetic? No Pain Assessment Patient in pain? yes     Location: head Intensity: 4 Onset of pain  x 3d   Primary Care Provider:  [facility]  CC:  headache x 3 days. back of head and eyes. worse in am's..  History of Present Illness: 2 week follow up visit after being seen as a work in for urinary frequency, incontinence and nocturia consistent with BPH symptoms.  BPH symptom scale showed severe symptoms.  He was started on doxazocin at 2mg  at bedtime and has had some improvement in symptoms.  He also has a lot of worry and rectal complaints today.  Has sigmoidoscopy with biopsy of tissue aproximately one year ago with GI.  He has requested a male MD, had a male MD and was making several SDA appointments, has been reassigned.  [place] immigrant, explained poor health system there. Talked about his father dying suddenly at a young age in [place]. Has not had full [facility] prevention completed.  BP elevated today.  Headaches are chronic, come in clusters, have never been worked up.  Does not have nausea, vomiting, associated.  Habits & Providers  Alcohol-Tobacco-Diet     Tobacco Status: quit  Allergies: No Known Drug Allergies  Family History: sister--colon problems Family History of Diabetes: brother No FH of Colon Cancer: Father died when he was young suddenly  Review of Systems General:  Denies fever, malaise, sweats, and weight loss. CV:  Denies chest pain or discomfort and fainting. GU:  Complains of nocturia, urinary frequency, and urinary hesitancy; denies dysuria and incontinence. Neuro:  Complains of headaches. Psych:  worry.  Physical  Exam  General:  Well-developed,well-nourished,in no acute distress; alert,appropriate and cooperative throughout examination Lungs:  normal respiratory effort and normal breath sounds.   Heart:  normal rate and regular rhythm.     Impression & Recommendations:  Problem # 1:  HYPERTENSION, BENIGN ESSENTIAL (ICD-401.1) Since still having BPH symptoms and BP not at goal will incresae doxazosin to 4 mg at bedtime, may need another agent.  Follow up first AM apt with new primary MD. His updated medication list for this problem includes:    Cardura 4 Mg Tabs (Doxazosin mesylate) ..... 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  Last scored 22, before starting alpha blocker. Orders: [facility]- Est Level  2 ([address])  Problem # 3:  FAMILY HISTORY OF SUDDEN CARDIAC DEATH (ICD-V17.41) Started low dose aspirin today; explained to make first AM appointment so that Lipids and other important screening labs can e done.  Complete Medication List: 1)  Cardura 4 Mg Tabs (Doxazosin mesylate) .... One qhs 2)  Aspirin 81 Mg Tbec (Aspirin) .... One daily  Patient Instructions: 1)  FIrst morning apt, with new primary MD in 3-4, do not eat after midnight so that you can get fasting blood work. Prescriptions: ASPIRIN 81 MG TBEC (ASPIRIN) one daily  #30 x 0   Entered and Authorized by:  Luretha Murphy NP   Signed by:   Luretha Murphy NP on 04/14/2009   Method used:   Print then Give to Patient   RxID:   [id number] CARDURA 4 MG TABS (DOXAZOSIN MESYLATE) one qhs Brand medically necessary #30 x 3   Entered and Authorized by:   Luretha Murphy NP   Signed by:   Luretha Murphy NP on 04/14/2009   Method used:   Print then Give to Patient   RxID:   [id number]    Prevention & Chronic Care Immunizations   Influenza vaccine: Not documented    Tetanus  booster: Not documented    Pneumococcal vaccine: Not documented  Other Screening   Smoking status: quit  (04/14/2009)  Lipids   Total Cholesterol: Not documented   LDL: Not documented   LDL Direct: Not documented   HDL: Not documented   Triglycerides: Not documented  Hypertension   Last Blood Pressure: 153 / 89  (04/14/2009)   Serum creatinine: 0.92  (01/26/2009)   Serum potassium 4.0  (01/26/2009)  Self-Management Support :    Hypertension self-management support: Not documented

## 2010-05-08 NOTE — Assessment & Plan Note (Signed)
Summary: fu/tlb   Vital Signs:  Patient profile:   50 year old male Height:      66.5 inches Weight:      172.8 pounds BMI:     27.57 Temp:     97.7 degrees F oral Pulse rate:   68 / minute BP sitting:   121 / 77  (left arm) Cuff size:   regular  Vitals Entered By: Garen Grams LPN (November 02, 2009 3:55 PM) CC: f/u dysuria Is Patient Diabetic? No Pain Assessment Patient in pain? yes     Location: stomach   Primary Care Provider:  [facility]  CC:  f/u dysuria.  History of Present Illness: Abdominal/rectal pain and dysuria Took the nortriptylene which helped the pain but made him constipated with black stools.  Over the last week has had more abdominal pain all over associated occsl black stools.  No nausea or vomiting diarhea and constipation has resolved.  He takes a benadryl to sleep at night with the pain.   No lightheadedness or chest pain.  The symptoms seem to be worse when he has stress and he has a school exam tomorrow.  ROS - as above PMH - Medications reviewed and updated in medication list.  Smoking Status noted in VS form    Habits & Providers  Alcohol-Tobacco-Diet     Tobacco Status: never  Current Medications (verified): 1)  Aspirin 81 Mg Tbec (Aspirin) .... One Daily 2)  Enalapril Maleate 10 Mg  Tabs (Enalapril Maleate) .... Take 1 Tab By Mouth Daily 3)  Gabapentin 100 Mg Caps (Gabapentin) .Marland Kitchen.. 1 Tablet At Bedtime For 2-3 Days Then Increase To 2 Tabs Then To 3 Tablets  Allergies: No Known Drug Allergies  Physical Exam  General:  Well-developed,well-nourished,in no acute distress; alert,appropriate and cooperative throughout examination Lungs:  Normal respiratory effort, chest expands symmetrically. Lungs are clear to auscultation, no crackles or wheezes. Heart:  Normal rate and regular rhythm. S1 and S2 normal without gallop, murmur, click, rub or other extra sounds. Abdomen:  Bowel sounds positive,abdomen soft and non-tender without  masses, organomegaly or hernias noted. Inguinal Nodes:  No significant adenopathy   Impression & Recommendations:  Problem # 1:  ABDOMINAL PAIN (ICD-789.00)  This seems very consistent with irritable bowel syndrome.  He had an extensive work up for similar symptoms in 2009 with colonoscopy and CTs. Check cbc to ro signifcant gi bleed.    Will try gabapentin which may also help with his chronic dysuria and rectal pain.  He is in agreement with the plan.    Orders: Urinalysis-[facility] (00000) CBC-[facility] ([address]) [facility]- Est Level  3 ([address])  Complete Medication List: 1)  Aspirin 81 Mg Tbec (Aspirin) .... One daily 2)  Enalapril Maleate 10 Mg Tabs (Enalapril maleate) .... Take 1 tab by mouth daily 3)  Gabapentin 100 Mg Caps (Gabapentin) .Marland Kitchen.. 1 tablet at bedtime for 2-3 days then increase to 2 tabs then to 3 tablets  Patient Instructions: 1)  I will call you if your blood test is abnormal 2)  Try the gabapentin as directed  3)  Call me if fever or bleeding or severe pain 4)  Please schedule a follow-up appointment in 6 months .  5)  good luck on the exam Prescriptions: GABAPENTIN 100 MG CAPS (GABAPENTIN) 1 tablet at bedtime for 2-3 days then increase to 2 tabs then to 3 tablets  #90 x 1   Entered and Authorized by:   Pearlean Brownie MD  Signed by:   Pearlean Brownie MD on 11/02/2009   Method used:   Electronically to        [facility] [phone]* (retail)       [address]       [place], [place]  [address]       Ph: [phone]       Fax: [phone]   RxID:   [id number]    Prevention & Chronic Care Immunizations   Influenza vaccine: Not documented    Tetanus booster: Not documented    Pneumococcal vaccine: Not documented  Other Screening   Smoking status: never  (11/02/2009)  Lipids   Total Cholesterol: 167  (05/08/2009)   LDL: 95  (05/08/2009)   LDL Direct: Not documented   HDL: 34  (05/08/2009)   Triglycerides: 191   (05/08/2009)  Hypertension   Last Blood Pressure: 121 / 77  (11/02/2009)   Serum creatinine: 0.92  (01/26/2009)   Serum potassium 4.0  (01/26/2009)  Self-Management Support :    Hypertension self-management support: Not documented    Hypertension self-management support not done because: Good outcomes  (06/12/2009)  Laboratory Results   Urine Tests  Date/Time Received: November 02, 2009 4:19 PM  Date/Time Reported: November 02, 2009 4:23 PM   Routine Urinalysis   Color: yellow Appearance: Clear Glucose: negative   (Normal Range: Negative) Bilirubin: negative   (Normal Range: Negative) Ketone: negative   (Normal Range: Negative) Spec. Gravity: 1.010   (Normal Range: 1.003-1.035) Blood: negative   (Normal Range: Negative) pH: 6.0   (Normal Range: 5.0-8.0) Protein: negative   (Normal Range: Negative) Urobilinogen: 0.2   (Normal Range: 0-1) Nitrite: negative   (Normal Range: Negative) Leukocyte Esterace: negative   (Normal Range: Negative)    Comments: ...........test performed by...........Marland KitchenTerese Door, CMA

## 2010-05-10 ENCOUNTER — Encounter: Payer: Self-pay | Admitting: *Deleted

## 2010-05-10 NOTE — Assessment & Plan Note (Signed)
Summary: stomach hurts/eo   Vital Signs:  Patient profile:   50 year old male Height:      66.5 inches Weight:      173 pounds BMI:     27.60 BSA:     1.89 Temp:     97.7 degrees F Pulse rate:   76 / minute BP sitting:   140 / 88  Vitals Entered By: Jone Baseman CMA (April 25, 2010 8:39 AM) CC: stomach problems Is Patient Diabetic? No Pain Assessment Patient in pain? yes     Location: stomach  Intensity: 3   Primary Care Provider:  [facility]  CC:  stomach problems.  History of Present Illness: Abdominal pain still present, comes and goes.  Feeling of pain all over. No nausea or vomiting or weight loss.  No blood but occls black stool.  Sometimes is specifically located in his rectal area.  Never tried the gabapentin.   See PMH for extensvie work up in past.  Dysuria still has on and off.  No blood or back pain or fever or change in urine amount.   Pain is a burning all through urethra area.  HYPERTENSION Disease Monitoring   Blood pressure range:  not checking     Chest pain: N     Dyspnea:N Medications   Compliance: daily enalapril   Lightheadedness: N     Edema:N  ROS - as above PMH - Medications reviewed and updated in medication list.  Smoking Status noted in VS form    Habits & Providers  Alcohol-Tobacco-Diet     Tobacco Status: quit     Tobacco Counseling: to quit use of tobacco products     Year Quit: 2006  Current Medications (verified): 1)  Aspirin 81 Mg Tbec (Aspirin) .... One Daily 2)  Enalapril Maleate 10 Mg  Tabs (Enalapril Maleate) .... Take 1 Tab By Mouth Daily 3)  Gabapentin 100 Mg Caps (Gabapentin) .Marland Kitchen.. 1 Tablet At Bedtime For 2-3 Days Then Increase To 2 Tabs Then To 3 Tablets 4)  Levsin 0.125 Mg Tabs (Hyoscyamine Sulfate) .Marland Kitchen.. 1 By Mouth 2 Times Daily As Needed For Abdominal Pain Attack  Allergies: No Known Drug Allergies  Social History: From [place];  No smoking or alcohol Occupation: Management consultant Illicit Drug Use - no Patient gets regular exercise. Best Contact # [phone] (home) Has son in mid school and daughter in elementary in 2011 Smoking Status:  quit  Physical Exam  Abdomen:  Bowel sounds positive,abdomen soft and non-tender without masses, organomegaly or hernias noted. Rectal:  No external abnormalities noted. . No rectal masses or tenderness. Genitalia:  partially circumcised    Impression & Recommendations:  Problem # 1:  ABDOMINAL PAIN (ICD-789.00)  most consistent with IBS.  He agrees.  Will try constant gabapentin and as needed levsin.   No red flags for cancer or infection  Orders: [facility]- Est  Level 4 (91478)  Problem # 2:  DYSURIA (ICD-788.1)  no pathology seen hopefully medications for IBS will help   Orders: [facility]- Est  Level 4 (29562)  Problem # 3:  HYPERTENSION, BENIGN ESSENTIAL (ICD-401.1) good control  His updated medication list for this problem includes:    Enalapril Maleate 10 Mg Tabs (Enalapril maleate) .Marland Kitchen... Take 1 tab by mouth daily  BP today: 140/88 Prior BP: 121/77 (11/02/2009)  Labs Reviewed: K+: 4.0 (01/26/2009) Creat: : 0.92 (01/26/2009)   Chol: 167 (05/08/2009)   HDL: 34 (05/08/2009)   LDL: 95 (05/08/2009)  TG: 191 (05/08/2009)  Complete Medication List: 1)  Aspirin 81 Mg Tbec (Aspirin) .... One daily 2)  Enalapril Maleate 10 Mg Tabs (Enalapril maleate) .... Take 1 tab by mouth daily 3)  Gabapentin 100 Mg Caps (Gabapentin) .Marland Kitchen.. 1 tablet at bedtime for 2-3 days then increase to 2 tabs then to 3 tablets 4)  Levsin 0.125 Mg Tabs (Hyoscyamine sulfate) .Marland Kitchen.. 1 by mouth 2 times daily as needed for abdominal pain attack  Patient Instructions: 1)  I think you have IBS - Irritable Bowel Syndrome 2)  Use the gabapentin every night start with 1 tablet and go up every week until you are taking 3 tablets at night. This should make you daily pain a little less and the attacks less painful 3)  When you have an attack use the Levsin as  needed up to twice a day to help decrease the pain. You can take tylenol along with this. Prescriptions: LEVSIN 0.125 MG TABS (HYOSCYAMINE SULFATE) 1 by mouth 2 times daily as needed for abdominal pain attack  #30 x 1   Entered and Authorized by:   Pearlean Brownie MD   Signed by:   Pearlean Brownie MD on 04/25/2010   Method used:   Electronically to        [facility] [address] (retail)       [phone]       [place], [place]  [address]       Ph: [phone]       Fax: [phone]   RxID:   [id number] GABAPENTIN 100 MG CAPS (GABAPENTIN) 1 tablet at bedtime for 2-3 days then increase to 2 tabs then to 3 tablets  #90 x 1   Entered and Authorized by:   Pearlean Brownie MD   Signed by:   Pearlean Brownie MD on 04/25/2010   Method used:   Electronically to        [facility] [address] (retail)       [phone]       [place], [place]  [address]       Ph: [phone]       Fax: [phone]   RxID:   [id number]    Orders Added: 1)  [facility]- Est  Level 4 [30160]

## 2010-05-10 NOTE — Progress Notes (Signed)
  Medications Added ENALAPRIL MALEATE 10 MG  TABS (ENALAPRIL MALEATE) Take 1 tab by mouth daily       Phone Note Refill Request Call back at [phone]   Refills Requested: Medication #1:  ENALAPRIL MALEATE 10 MG  TABS Take 1 tab by mouth daily Please fax new rx order to have meds dispensed for 10mg s.  Last refill had mg and pt cannot afford the 5mg .  It's cheaper to get 37m Enalapril.  Please send to [facility]  Initial call taken by: Abundio Miu,  April 25, 2010 12:07 PM    New/Updated Medications: ENALAPRIL MALEATE 10 MG  TABS (ENALAPRIL MALEATE) Take 1 tab by mouth daily Prescriptions: ENALAPRIL MALEATE 10 MG  TABS (ENALAPRIL MALEATE) Take 1 tab by mouth daily  #30 x 6   Entered and Authorized by:   Pearlean Brownie MD   Signed by:   Pearlean Brownie MD on 04/25/2010   Method used:   Electronically to        [facility] [address] (retail)       [phone]       [place], [place]  [address]       Ph: [phone]       Fax: [phone]   RxID:   [id number]

## 2010-05-16 NOTE — Miscellaneous (Signed)
  Medications Added HYOSCYAMINE SULFATE 0.125 MG TABS (HYOSCYAMINE SULFATE) 1-2 every 6 hours as needed for abdominal pain       Clinical Lists Changes  Medications: Changed medication from LEVSIN 0.125 MG TABS (HYOSCYAMINE SULFATE) 1 by mouth 2 times daily as needed for abdominal pain attack to HYOSCYAMINE SULFATE 0.125 MG TABS (HYOSCYAMINE SULFATE) 1-2 every 6 hours as needed for abdominal pain - Signed Rx of HYOSCYAMINE SULFATE 0.125 MG TABS (HYOSCYAMINE SULFATE) 1-2 every 6 hours as needed for abdominal pain;  #30 x 2;  Signed;  Entered by: Pearlean Brownie MD;  Authorized by: Pearlean Brownie MD;  Method used: Electronically to [facility] [phone] [place], [place], [place]  [address], Ph: [phone], Fax: [phone]    Prescriptions: HYOSCYAMINE SULFATE 0.125 MG TABS (HYOSCYAMINE SULFATE) 1-2 every 6 hours as needed for abdominal pain  #30 x 2   Entered and Authorized by:   Pearlean Brownie MD   Signed by:   Pearlean Brownie MD on 05/08/2010   Method used:   Electronically to        [facility] [address] (retail)       [phone]       [place], [place]  [address]       Ph: [phone]       Fax: [phone]   RxID:   [id number]

## 2010-06-20 ENCOUNTER — Ambulatory Visit (INDEPENDENT_AMBULATORY_CARE_PROVIDER_SITE_OTHER): Payer: Self-pay | Admitting: Family Medicine

## 2010-06-20 ENCOUNTER — Encounter: Payer: Self-pay | Admitting: Family Medicine

## 2010-06-20 VITALS — BP 132/77 | HR 72 | Temp 97.8°F | Ht 66.0 in | Wt 170.4 lb

## 2010-06-20 DIAGNOSIS — R300 Dysuria: Secondary | ICD-10-CM

## 2010-06-20 DIAGNOSIS — R109 Unspecified abdominal pain: Secondary | ICD-10-CM

## 2010-06-20 LAB — POCT URINALYSIS DIPSTICK
Bilirubin, UA: NEGATIVE
Blood, UA: NEGATIVE
Glucose, UA: NEGATIVE
Ketones, UA: NEGATIVE
Nitrite, UA: NEGATIVE
Protein, UA: NEGATIVE
Spec Grav, UA: 1.02
Urobilinogen, UA: 0.2
pH, UA: 6.5

## 2010-06-20 LAB — BASIC METABOLIC PANEL
BUN: 15 mg/dL (ref 6–23)
CO2: 23 mEq/L (ref 19–32)
Calcium: 9.6 mg/dL (ref 8.4–10.5)
Chloride: 102 mEq/L (ref 96–112)
Creat: 0.93 mg/dL (ref 0.40–1.50)
Glucose, Bld: 114 mg/dL — ABNORMAL HIGH (ref 70–99)
Potassium: 4.2 mEq/L (ref 3.5–5.3)
Sodium: 138 mEq/L (ref 135–145)

## 2010-06-20 LAB — CONVERTED CEMR LAB
BUN: 15 mg/dL (ref 6–23)
CO2: 23 meq/L (ref 19–32)
Calcium: 9.6 mg/dL (ref 8.4–10.5)
Chloride: 102 meq/L (ref 96–112)
Creatinine, Ser: 0.93 mg/dL (ref 0.40–1.50)
Glucose, Bld: 114 mg/dL — ABNORMAL HIGH (ref 70–99)
Potassium: 4.2 meq/L (ref 3.5–5.3)
Sodium: 138 meq/L (ref 135–145)

## 2010-06-20 LAB — POCT UA - MICROSCOPIC ONLY

## 2010-06-21 NOTE — Progress Notes (Signed)
  Subjective:    Patient ID: [name], male    DOB: Sep 19, 1960, 50 y.o.   MRN: [id number]  HPI Comes to clinic with complaint today of pain in R flank.  States pain has been present x2 weeks and feels like it never goes away.  Describes pain as mostly dull deep achiness, with sharp pain occasionally.  Has been trying to ignore pain but wanted to come in to be evaluated.  Does admit to occasional burning with urination, no blood seen in his urine.  Has had increased fatigue.  Denies n/v, diarrhea, trauma, fever, chills.  Is having normal bowel movements and tolerating PO without difficulty.  Per patient does have a history of renal stones and a brother who was recently diagnosed with Kidney cancer.     Review of Systems   See HPI Objective:   Physical Exam  Constitutional: He appears well-developed and well-nourished. No distress.  HENT:  Head: Normocephalic and atraumatic.  Cardiovascular: Normal rate and regular rhythm.   Pulmonary/Chest: Effort normal and breath sounds normal.  Abdominal: Soft. Bowel sounds are normal. He exhibits no distension. There is no tenderness. There is no rebound, no guarding and no CVA tenderness.  Musculoskeletal:       Thoracic back: He exhibits no tenderness, no pain and no spasm.       Lumbar back: He exhibits no tenderness, no pain and no spasm.          Assessment & Plan:

## 2010-06-25 ENCOUNTER — Telehealth: Payer: Self-pay | Admitting: *Deleted

## 2010-06-25 NOTE — Telephone Encounter (Signed)
Called [facility]. appt for pt is 22.March at 11 am at [facility]. It was scheduled through [facility]. Called pt and lmvm with appt info.Arlyss Repress

## 2010-06-25 NOTE — Telephone Encounter (Signed)
Message copied by Arlyss Repress on Mon Jun 25, 2010  3:55 PM ------      Message from: Everrett Coombe      Created: Thu Jun 21, 2010  2:55 PM      Regarding: Call Patient       Please call patient and let him know it is ok for him  go to hospital to have abdominal CT, ordered entered.  Let him know kidney function looks good.            Thanks,            [employer]

## 2010-06-26 ENCOUNTER — Telehealth: Payer: Self-pay | Admitting: *Deleted

## 2010-06-26 NOTE — Telephone Encounter (Signed)
Called pt again and left detailed message with appt for ct scan [name]

## 2010-06-28 ENCOUNTER — Ambulatory Visit (HOSPITAL_COMMUNITY)
Admission: RE | Admit: 2010-06-28 | Discharge: 2010-06-28 | Disposition: A | Discharge status: Home / Self Care | Payer: Self-pay | Source: Ambulatory Visit | Attending: Family Medicine | Admitting: Family Medicine

## 2010-06-28 DIAGNOSIS — R109 Unspecified abdominal pain: Secondary | ICD-10-CM | POA: Insufficient documentation

## 2010-06-28 DIAGNOSIS — N200 Calculus of kidney: Secondary | ICD-10-CM | POA: Insufficient documentation

## 2010-06-28 MED ORDER — IOHEXOL 300 MG/ML  SOLN
100.0000 mL | Freq: Once | INTRAMUSCULAR | Status: AC | PRN
  Administered 2010-06-28: 100 mL via INTRAVENOUS

## 2010-06-28 NOTE — Assessment & Plan Note (Addendum)
Patient with flank pain x2 weeks. Does not seem to be musculoskeletal.  Suspicious for renal stone.  Will get u/a to look for possible infection vs. Signs of stone.  Will also order CT abd/pelvis w/ wo contrast to evaluated to stones vs. Other etiology, since brother recently dx with renal cancer.  Has not had Cr. Checked in a while so will get BMET to ensure it is ok for him to get contrast.  Possible anxiety component to this with brother's recent dx, especially if w/u is normal.  Precepted with Dr. Perley Jain

## 2010-07-10 ENCOUNTER — Telehealth: Payer: Self-pay | Admitting: Family Medicine

## 2010-07-10 NOTE — Telephone Encounter (Signed)
Need results of CT scan.

## 2010-07-11 NOTE — Telephone Encounter (Signed)
Called patient again to give results of CT scan, explained to him that I do not think stone in kidney is what is giving him his pain.  Still having intermittent pain but improved some.  Told him if he continues to have pain may want to come back to be seen again for further work-up

## 2010-07-31 ENCOUNTER — Ambulatory Visit (INDEPENDENT_AMBULATORY_CARE_PROVIDER_SITE_OTHER): Payer: Self-pay | Admitting: Family Medicine

## 2010-07-31 ENCOUNTER — Encounter: Payer: Self-pay | Admitting: Family Medicine

## 2010-07-31 VITALS — BP 127/82 | HR 64 | Temp 98.4°F | Ht 65.0 in | Wt 170.3 lb

## 2010-07-31 DIAGNOSIS — M940 Chondrocostal junction syndrome [Tietze]: Secondary | ICD-10-CM

## 2010-07-31 DIAGNOSIS — R52 Pain, unspecified: Secondary | ICD-10-CM

## 2010-07-31 MED ORDER — IBUPROFEN 600 MG PO TABS: 600.0000 mg | ORAL_TABLET | Freq: Three times a day (TID) | ORAL | Status: AC | PRN

## 2010-07-31 MED ORDER — TRAMADOL-ACETAMINOPHEN 37.5-325 MG PO TABS: 1.0000 | ORAL_TABLET | Freq: Four times a day (QID) | ORAL | Status: AC | PRN

## 2010-07-31 MED ORDER — KETOROLAC TROMETHAMINE 30 MG/ML IJ SOLN
30.0000 mg | Freq: Once | INTRAMUSCULAR | Status: AC
  Administered 2010-07-31: 30 mg via INTRAMUSCULAR

## 2010-07-31 NOTE — Patient Instructions (Signed)
  Costochondritis (Costochondral Separation / Tietze Syndrome)  Your back and chest pain is likely from a strain in your back.  I am giving you a shot of a string anti-inflammatory for your pain  I am also prescribing you some ibuprofen and ultracet for your MSK strain Follow up with Dr. Deirdre Priest next week  Otherwise, with any questions God Bless,  Doree Albee  Costochondritis (Tietze syndrome), or costochondral separation, is a swelling and irritation (inflammation) of the tissue (cartilage) that connects your ribs with your breastbone (sternum). It may occur on its own (spontaneously), through damage caused by an accident (trauma), or simply from coughing or minor exercise. It may take up to 6 weeks to get better and longer if you are unable to be conservative in your activities. HOME CARE INSTRUCTIONS  Avoid exhausting physical activity. Try not to strain your ribs during normal activity. This would include any activities using chest, belly (abdominal) and side muscles, especially if heavy weights are used.   Use ice for 15  minutes per hour while awake for the first 2 days. Place the ice in a plastic bag, and place a towel between the bag of ice and your skin.   Only take over-the-counter or prescription medicines for pain, discomfort, or fever as directed by your caregiver.  SEEK IMMEDIATE MEDICAL CARE IF:  Your pain increases or you are very uncomfortable.   An oral temperature above 100.5 develops.   You develop difficulty with your breathing.   You cough up blood.   You develop worse chest pains, shortness of breath, sweating, or vomiting.   You develop new, unexplained problems (symptoms).  MAKE SURE YOU:   Understand these instructions.   Will watch your condition.   Will get help right away if you are not doing well or get worse.  Document Released: 01/02/2005 Document Re-Released: 06/19/2009 [facility] Patient Information 2011 [place], [place].

## 2010-07-31 NOTE — Assessment & Plan Note (Signed)
Presentation c/w constochondritis. Chest pain noncardiac in nature.  Toradol shot given in clinic. Rx for ibuprofen and ultracet given. Pt instructed to use ultracet on scheduled basis for pain with intermittent ibuprofen use in setting of ARB use and hypertension. Pt instructed to follow up with PCP in 1 week for reassessment.

## 2010-07-31 NOTE — Progress Notes (Signed)
  Subjective:    Patient ID: [name], male    DOB: [birth year], 50 y.o.   MRN: [id number]  HPI Back/Chest pain x 3-4 days. 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 Has had R lateral chest wall/R back pain over last 4-5 days. Pain worse with movement and deep breathing. No fevers, cough, SOB. No rash, hx/o shingles.  + Chest pain assd with movement and deep breathing. No diaphoresis or chest pain radiation. Pain 6-8/10   Review of Systems See HPI     Objective:   Physical Exam Gen: up in chair, NAD CV: RRR, no murmurs auscultated PULM: CTAB, no wheezes, rales, rhoncii, + TTP along R lateral chest wall, R back ABD: S/NT/+ bowel sounds         Assessment & Plan:  Chest/Back Pain- Presentation c/w constochondritis. Chest pain noncardiac in nature.  Toradol shot given in clinic. Rx for ibuprofen and ultracet given. Pt instructed to use ultracet on scheduled basis for pain with intermittent ibuprofen use in setting of ARB use and hypertension. Pt instructed to follow up with PCP in 1 week for reassessment.

## 2010-08-21 NOTE — Assessment & Plan Note (Signed)
[facility]                         GASTROENTEROLOGY OFFICE NOTE   NAME:[name]                    MRN:          [id number]  DATE:05/29/2007                            DOB:          09/13/1960    [name] is a 50 year old middle-eastern male, whom I have seen in  the past because of recurrent rectal pain, which has really been  exacerbated since hemorrhoidopexy by Dr. Dominga Ferry in May of 2007,  because of a prolapsed hemorrhoid.  He had an inflammatory polyp removed  at the time of his colonoscopy in October of 2007, but workup otherwise  for inflammatory bowel disease, including ileal biopsy and inflammatory  bowel disease serologies, were all negative.  He was treated with local  anal care and continued to have rectal pain, despite medical therapy,  and was referred back to Dr. Lavell Islam for a second opinion.  This was  completed on October 03, 2006, and proctoscopy was unremarkable and the  patient was apparently referred to [facility] for another opinion.  He  apparently saw Dr. Freida Busman at [facility] on November 03, 2006, and had  what sounds like possible anorectal manometry and defecography.  In any  case, the patient has not improved, continuing to complain of rectal  pain.   He was recently seen in [facility] center because of a  history of melanotic stools with diffuse abdominal pain, gas and  bloating.  Apparently, he had a positive fecal occult blood test, but  otherwise normal exam, but I cannot see where laboratory data was  performed.  He was placed on Prilosec 40 mg a day, along with p.r.n.  Ultram 50 mg and referred back to GI for evaluation.   Patient continues to complain of periodic black, tarry stools,  continued, constant rectal discomfort.  He has had no anorexia, weight-  loss, fever, chills, upper GI or hepatobiliary complaints otherwise.  He  denies the use of salicylates or aspirin, alcohol, or  over-the-counter  medications.   FAMILY HISTORY:  Noncontributory in terms of any known gastrointestinal  problems.   He is a healthy-appearing, middle-eastern male, in no distress,  appearing his stated age.  He weighs 174 pounds and blood pressure is 140/86 and pulse was 68 and  regular.  I could not appreciate stigmata of chronic liver disease.  His chest was clear, he was in a regular rhythm without murmurs, gallops  or rubs.  Abdominal exam showed no organomegaly, masses or tenderness.  Bowel  sounds were normal.  Inspection of rectum was unremarkable, without fissures or fistulae.  Rectal exam showed no masses or tenderness.  There was soft, normal-  colored stool in the rectal vault that was guaiac-negative.  He did have  a palpable rectal fissure.  It was dilated as best possible with my  digit.  Peripheral extremities were unremarkable.  Mental status was normal.   ASSESSMENT:  1. Recurrent rectal stricturing and discomfort, in some way related to      his previous rectal stapling procedure.  As mentioned above, we      have done  a thorough workup for anal-rectal Crohn's disease without      evidence of this process.  2. Guaiac-positive stools of unexplained etiology.  Rule out      underlying inflammatory bowel disease or upper GI source of his GI      blood-loss.   RECOMMENDATIONS:  1. Check CBC and screening laboratory parameters.  Also would check      anemia profile.  2. Outpatient endoscopy, small-bowel biopsy, and colonoscopy.  3. Repeat Canasa suppositories at bedtime until workup can be      completed.  4. We will try to obtain records from [facility] for review.     [name]. Jarold Motto, MD, Caleen Essex, FAGA  Electronically Signed    DRP/MedQ  DD: 05/29/2007  DT: 05/29/2007  Job #: [id number]

## 2010-08-24 NOTE — H&P (Signed)
[name], [name]             ACCOUNT NO.:  [id number]   MEDICAL RECORD NO.:  [id number]          PATIENT TYPE:  INP   LOCATION:  0103                         FACILITY:  [facility]   PHYSICIAN:  Angelia Mould. Derrell Lolling, M.D.DATE OF BIRTH:  08-19-1960   DATE OF ADMISSION:  09/07/2005  DATE OF DISCHARGE:                                HISTORY & PHYSICAL   CHIEF COMPLAINT:  Severe rectal pain and low volume rectal bleeding.   HISTORY OF PRESENT ILLNESS:  This is a 50 year old [place] gentleman  who had a transanal stapled procedure for prolapse and hemorrhoids on Aug 30, 2005.  This is also known as a stapled hemorrhoidopexy .  Dr. Lurene Shadow  performed this procedure.  I have reviewed his operative note and it was  uneventful.  The patient went home the same day and did well for the first 3  days, tolerating his diet, having no abdominal pain, has had 2-3 bowel  movements, and continues to pass flatus to the present time.  He became  constipated 2-3 days ago.  He is somewhat vague about that.  He has been  having worsening rectal pain for the past 36-48 hours.  He needs to pass a  little bit of blood, not much, continues to deny abdominal pain, continues  to have a good appetite but he is very anxious about his pain and is unable  to do anything else.  He denies nausea, vomiting, or fever.  He called me on  the phone earlier and he was asked to come to the emergency room.   PAST HISTORY:  He has had a variety of gastrointestinal complaints and  workups in the past but states that no specific diagnosis has been found.  He underwent be stapled hemorrhoidopexy  on Aug 30, 2005 by Dr. Lurene Shadow.  Otherwise no medical or surgical problems.   CURRENT MEDICATIONS:  He takes some type of sleeping medication and some  type of medication for constipation.   DRUG ALLERGIES:  None known.   SOCIAL HISTORY:  He is married, has three children.  His is a Loss adjuster, chartered at [employer] here in  [place].  He denies the use of  alcohol or tobacco.   FAMILY HISTORY:  Mother deceased fever and malaria.  Father deceased of  unknown cause.   REVIEW OF SYSTEMS:  A 15-system review of systems is performed and is  noncontributory, except as described above.   PHYSICAL EXAMINATION:  GENERAL:  A thin, middle-aged, [place]  gentleman in mild distress.  He is alert and cooperative.  He is anxious.  VITAL SIGNS:  Temp 97.5, pulse 72, respirations 18, blood pressure 146/88.  EYES:  Sclerae clear.  Extraocular movements intact.  EARS/NOSE/MOUTH/THROAT:  Nose, lips, tongue, and oropharynx are without  gross lesions.  NECK:  Supple, nontender.  No mass.  No jugular distension.  LUNGS:  Clear to auscultation.  No chest wall tenderness.  HEART:  Regular rate rhythm.  No murmur.  Radial and femoral pulses are  palpable.  ABDOMEN:  Soft, nontender, not distended.  No mass.  No  hernia.  Active  bowel sounds.  RECTAL:  External inspection of the perianal area is normal.  I see no  fissure or fistula.  I see no cellulitis.  There is no mass or perirectal  abscess.  With good lubrication, I was able to insert by finger through his  anal canal.  This was quite uncomfortable but the he had good sphincter  tone, good relaxation, and I felt no mass.  There was a little bit of blood  on my fingertip.  I did not feel any abscess or mass.  EXTREMITIES:  He moves all four extremities well without pain or deformity.  NEUROLOGIC:  No gross motor sensory deficits.   ADMISSION DATA:  Hemoglobin 14.9, white blood cell count 8600.  CT scan of  the pelvis shows no abscess and no obstruction.  There is a fair amount of  stool in the rectum and sigmoid colon above the staple line.  I can see the  staple line is intact and circumferential.  There is no dilatation of the  bowel above this.  I can see air both above and below the staple line and  there does not appear to be any stenosis.  There is a little  bit of  circumferential edema which I consider a fairly normal postoperative  finding.  There is no abscess or fluid collection.  There is no extraluminal  air.   ASSESSMENT:  1.  Rectal pain.  Postoperative stapled hemorrhoidectomy.  I suspect that he      has constipation as his main problem and a little bit of edema of the      rectal wall.   PLAN:  1.  I offered the patient several options and we have decided to admit him      to the hospital for pain control and reversal of his constipation with      laxatives.  2.  At this point, since there does not appear to be any stenosis or      abscess, I am going to hold off on proctoscopy and exam under      anesthesia, unless he fails to improve.  The patient is aware that if he      does not improve, that we will have take him to the operating room for      exam.  He is in complete agreement with this plan.      Angelia Mould. Derrell Lolling, M.D.  Electronically Signed     HMI/MEDQ  D:  09/07/2005  T:  09/07/2005  Job:  [id number]   cc:   Leonie Man, M.D.  [address]  [address]  [place]  [place] [address]

## 2010-08-24 NOTE — Op Note (Signed)
[name], [name]             ACCOUNT NO.:  [id number]   MEDICAL RECORD NO.:  [id number]          PATIENT TYPE:  AMB   LOCATION:  DAY                          FACILITY:  [facility]   PHYSICIAN:  Leonie Man, M.D.   DATE OF BIRTH:  [birth year]   DATE OF PROCEDURE:  08/30/2005  DATE OF DISCHARGE:                                 OPERATIVE REPORT   PREOPERATIVE DIAGNOSIS:  Prolapsed hemorrhoid.   POSTOPERATIVE DIAGNOSIS:  Prolapsed hemorrhoid.   PROCEDURE:  Hemorrhoidopexy (PPH).   SURGEON:  Dr. Lurene Shadow.   ASSISTANT:  OR nurse.   ANESTHESIA:  Is general.  Positioned prone jackknife.   [name] is a 50 year old man with a history of prolapsing hemorrhoids  associated with some bleeding.  He comes to the operating room for procedure  for prolapsed hemorrhoids.  Risks and potential benefits of surgery have  been discussed with the patient.  All questions answered and consent  obtained.   PROCEDURE:  Following induction of satisfactory general and endotracheal  anesthesia, the patient was positioned in the prone jackknife position, and  the perianal tissues are prepped and draped to be included in a sterile  operative field.  The perianal area was infiltrated with 0.25% Marcaine with  epinephrine and Wydase.  The anal verge is dilated up to three  fingerbreadths.  The operating scope was placed within the anus and a  pursestring suture placed 5 cm above the dentate line with a 2-0 Novofil.  The pursestring suture was tested and noted to be intact.  The anvil of the  PPH stapling device was placed above the pursestring, and the pursestring  was tied down over the stem of the anvil.  The hemorrhoids was drawn into  the device as the device was closed down to 4 cm.  This was held for one  minute and then fired.  The device was removed from the anal canal.  A  complete circle of mucosa was removed.  Upon examination, the suture line  was noted to be intact.  Several bleeding points  were treated with  electrocautery or with stitches of 2-0 Vicryl.  A Gelfoam pad was placed  over the suture line.  Sponge, instrument and sharp counts were verified and  the patient removed from the operating room to the recovery room in stable  condition after dressing was placed over the anus.      Leonie Man, M.D.  Electronically Signed    PB/MEDQ  D:  08/30/2005  T:  08/31/2005  Job:  [id number]   cc:   Lindaann Slough, M.D.  Fax: [phone]

## 2010-08-24 NOTE — Discharge Summary (Signed)
NAMEDAUD, CAYER             ACCOUNT NO.:  [id number]   MEDICAL RECORD NO.:  [id number]          PATIENT TYPE:  INP   LOCATION:  1509                         FACILITY:  [facility]   PHYSICIAN:  Leonie Man, M.D.   DATE OF BIRTH:  11-Jul-1960   DATE OF ADMISSION:  09/07/2005  DATE OF DISCHARGE:  09/09/2005                                 DISCHARGE SUMMARY   ADMISSION DIAGNOSIS:  Rectal pain status post PPHwith associated  constipation.   DISCHARGE DIAGNOSIS:  Rectal pain status post PPH with associated  constipation.   PROCEDURES IN THE HOSPITAL:  Clear liquids and laxative.   COMPLICATIONS:  None.   CONDITION ON DISCHARGE:  Improved.   Mr. Brindisi is a 50 year old man who underwent a hemorrhoidectomy for staple  hemorrhoidopexy on Aug 30, 2005.  The patient went home on the same day and  did well for the first three days tolerating his diet without any abdominal  pain or discomfort.  He has had multiple bowel movements since that time and  continues to pass flatus at the time of admission.  He apparently became  constipated approximately 2-3 days before admission to the hospital and then  started complaining of worsening rectal pain over the previous 36 hours  prior to admission.  He did note that he passed a small amount of blood at  that time. He does not have any abdominal pain.  His appetite has been good.  Following admission to the hospital, the patient was then placed on clear  liquids and laxatives with complete relief of his pain and constipation.  He  did not have any further rectal bleeding.  He is being discharged to be  followed in the office in one week for further evaluation of his pain.   CONDITION ON DISCHARGE:  Improved.   DISCHARGE MEDICATIONS:  Darvocet N100 every 4-6 hours p.r.n. for pain. The  patient is also asked to take some MiraLax.      Leonie Man, M.D.  Electronically Signed     PB/MEDQ  D:  10/10/2005  T:  10/10/2005  Job:  [id number]

## 2010-08-24 NOTE — Assessment & Plan Note (Signed)
[facility]                         GASTROENTEROLOGY OFFICE NOTE   NAME:Eckhardt, SHREYAN HINZ                    MRN:          [id number]  DATE:07/29/2006                            DOB:          20-Feb-1961    Mr. Upperman continues to complain of constant, throbbing pain in his  rectum but he is having more regular bowel movements since his last  rectal exam and rectal dilation on July 15, 2006.  He has so much pain  sitting that he is back taking, I suspect, narcotics that were given to  him after his rectal surgery (hemorrhoidopexy) on Aug 30, 2005, by Dr.  Lurene Shadow.  He currently is using empiric Lialda 2.4 g a day, Canasa 1 g  suppositories at night, and local anal salve.   His vital signs were all stable as his abdominal exam is unremarkable.  On inspection of his rectum I can really see no perianal lesions,  fistula, or fissures.  Rectal exam continues to show a posterior  stricture which was dilated with my digit.  There was no blood or other  material in the rectal vault.   ASSESSMENT:  Mr. Gatliff continues to have unexplained rectal pain which  seems most consistent with a rectal fissure.  I am perplexed about his  inability to respond to any medical therapy.  At his previous  colonoscopy in November 2007 I did remove an inflammatory perirectal  polyp.  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   RECOMMENDATIONS:  1. Refer to Dr. Kendrick Ranch for second surgical opinion.  2. Continue aminosalicylate therapy and we will try diltiazem 2% gel      three times a day locally to his rectum.  3. I have given him a prescription for some Percodan #50 to use since      he is studying for exams and cannot sit, he says, currently because      of his rectal pain.  4. No followup until he has seen Dr. Earlene Plater and I have another opinion      about how to proceed here.  It seems to be a  most unusual case.     [name]. Jarold Motto, MD, Caleen Essex, FAGA  Electronically Signed    DRP/MedQ  DD: 07/29/2006  DT: 07/29/2006  Job #: [id number]   cc:   Sheppard Plumber. Earlene Plater, M.D.

## 2010-08-24 NOTE — Assessment & Plan Note (Signed)
[facility]                           GASTROENTEROLOGY OFFICE NOTE   NAME:[name], Justin BRESHEARS                    MRN:          [id number]  DATE:02/13/2006                            DOB:          1960-12-11    [name] returns after his colonoscopy on January 31, 2006.  He had a  somewhat stenotic rectum with an inflammatory polyp that was excised.  He  has been on Canasa suppositories at bedtime and is approximately 90% better.  He denies abdominal or rectal pain, or rectal bleeding.   LABORATORY DATA:  Showed a negative IBD serology panel, and CBC was normal  with a normal sed rate and C-reactive protein.   I also obtained, at the time of his colonoscopy, a biopsy of his ileum that  was normal.  Biopsy of the rectum showed acute and chronic inflammation with  granulation tissue and a hyperplastic inflammatory polyp.   I have asked [name] to continue his Canasa suppositories at bedtime for  another month with office followup at that time.  We will proceed according  to his clinical response and course.     [name]. Jarold Motto, MD, Caleen Essex, FAGA  Electronically Signed    DRP/MedQ  DD: 02/13/2006  DT: 02/13/2006  Job #: [id number]   cc:   Leonie Man, M.D.

## 2010-08-24 NOTE — Assessment & Plan Note (Signed)
[facility]                         GASTROENTEROLOGY OFFICE NOTE   NAME:[name], Justin STERLING                    MRN:          [id number]  DATE:07/15/2006                            DOB:          August 03, 1960    [name] has returned after being last seen on February 13, 2006. At  that time he had an inflammatory polyp excised from his rectum and had  mild proctitis which responded rather quickly to Canasa suppositories.  The last 3 weeks he has had rectal urgency with some rectal bleeding a  pressure in his scrotal area. He takes a large amount of Aleve for  arthritis problems, which may have flared his proctitis. He denies any  upper GI or hepatobiliary, or other systemic complaints. His appetite is  good and his weight is stable. Because of rectal urgency he has missed  the last 3 weeks of school.   He is awake and alert and in no acute distress. He weighs 174 pounds,  and blood pressure is 120/80, pulse is 68 and regular.  ABDOMINAL EXAM: Entirely benign.  Inspection of the rectum also was unremarkable. He had some rectal  stenosis that was dilated with my digit as best as possible. There  appeared to be no heme in the rectal vault and stool was guaiac  negative.   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 He had previous colonoscopy to  the cecum on October 26, __________, which otherwise was unremarkable.   RECOMMENDATIONS:  1. Lialda 2.4 grams p.o. daily.  2. Canasa 1 gram suppositories at bedtime.  3. Discontinue NSAID use and use Celebrex 200 mg 1 to 2 times a day as      needed for arthritis.  4. Gastrointestinal follow up in 2 to 3 weeks time.     [name]. Jarold Motto, MD, Caleen Essex, FAGA  Electronically Signed    DRP/MedQ  DD: 07/15/2006  DT: 07/15/2006  Job #: [id number]   cc:   Leonie Man, M.D.

## 2010-08-24 NOTE — Assessment & Plan Note (Signed)
[facility]                           GASTROENTEROLOGY OFFICE NOTE   NAME:Melder, SHAKIM FAITH                    MRN:          [id number]  DATE:12/31/2005                            DOB:          May 30, 1960    Mr. Griffee is a 50 year old Middle-Eastern male referred from [facility] for evaluation of rectal pain.   Mr. Yielding has a long history of what sounds like irritable bowel syndrome  and may have had endoscopy and colonoscopy in [place] several years ago,  although this is unclear.  In any case, he had staple-line therapy because  of prolapsing internal hemorrhoids in June 2007 by Dr. Lurene Shadow.  On reviewing  his records, this was actually performed on Aug 30, 2005, because of  prolapsing, bleeding hemorrhoids.  The operation included the anvil of the  PPH stapling device.  The patient subsequently had difficulties  postoperatively and had rather severe pain on October 20, 2005, and was  actually seen in the [facility] Emergency Room and also was seen by Dr.  Lurene Shadow, who repeated a sigmoidoscopy which showed some friability along the  staple line.  He also had CT scan of the abdomen previously on September 07, 2005,  which was unremarkable except for questionable fatty infiltration of the  liver.  CT scan of the pelvis showed some mild diffuse concentric wall  thickening at the level of the staple line, but otherwise was negative.   Since that time, this patient has complained of rectal pain, discharge and  incontinency with gas and bloating.  He denies upper GI or hepatobiliary  complaints at this time.   PAST MEDICAL HISTORY:  Otherwise noncontributory.   MEDICATIONS:  None.   ALLERGIES:  None.   FAMILY HISTORY:  Noncontributory.   SOCIAL HISTORY:  He is married and has 3 children.  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  He does not smoke or use ethanol.   REVIEW OF SYSTEMS:  Noncontributory.   PHYSICAL EXAMINATION:  GENERAL:  He is a healthy-appearing, Middle-Eastern  male in no acute distress.  I could not appreciate stigmata of chronic liver  disease.  VITAL SIGNS:  He is 5 feet 5 inches tall and weight 173 pounds.  Blood  pressure was 120/82 and pulse was 72 and regular.  CHEST:  Clear.  CARDIAC:  There were no murmurs, gallops or rubs noted.  He appeared to be  in a regular rhythm.  ABDOMEN:  I could not appreciate hepatosplenomegaly, abdominal masses or  tenderness.  Bowel sounds were normal.  RECTAL:  Inspection of the rectum was unremarkable, as was rectal exam,  although there was a very stenotic rectum, which I dilated with my digit.  Dilation of the rectum did produce some trauma and some slight red blood on  rectal exam.  PERIPHERAL EXTREMITIES:  Unremarkable.  MENTAL STATUS:  Clear.   ASSESSMENT:  Somewhat unusual case.  The patient seemed to have had a bad  outcome from a stapling procedure of his rectum.  Exam today seemed to show  evidence of rectal stenosis that  hopefully was dilated somewhat by digital  exam.  I suspect the underlying question is does he have significant  obstruction of his rectum or inflammatory bowel disease.   RECOMMENDATIONS:  1. Repeat screening laboratory parameters including a sed rate and CRP.  2. Trial of Canasa 1 gram suppositories at bedtime.  3. Outpatient colonoscopy exam.                                   [name]. Jarold Motto, MD, Clementeen Graham, [place]   DRP/MedQ  DD:  12/31/2005  DT:  01/02/2006  Job #:  [id number]   cc:   Leonie Man, M.D.  [facility]

## 2010-10-11 ENCOUNTER — Ambulatory Visit: Payer: Self-pay | Admitting: Family Medicine

## 2010-10-12 ENCOUNTER — Ambulatory Visit (INDEPENDENT_AMBULATORY_CARE_PROVIDER_SITE_OTHER): Payer: Self-pay | Admitting: Family Medicine

## 2010-10-12 ENCOUNTER — Encounter: Payer: Self-pay | Admitting: Family Medicine

## 2010-10-12 VITALS — BP 133/79 | HR 72 | Temp 97.9°F | Wt 176.0 lb

## 2010-10-12 DIAGNOSIS — R208 Other disturbances of skin sensation: Secondary | ICD-10-CM | POA: Insufficient documentation

## 2010-10-12 DIAGNOSIS — R209 Unspecified disturbances of skin sensation: Secondary | ICD-10-CM

## 2010-10-12 MED ORDER — HYDROCORTISONE 1 % EX CREA: TOPICAL_CREAM | CUTANEOUS | Status: DC

## 2010-10-12 MED ORDER — TRAMADOL HCL 50 MG PO TABS: 50.0000 mg | ORAL_TABLET | Freq: Four times a day (QID) | ORAL | Status: DC | PRN

## 2010-10-12 NOTE — Assessment & Plan Note (Signed)
Unsure of etiology of symptoms.  Will prescribe hydrocortisone 1% for area to see if this improves the perceived inflammation of the skin in the area.   Tramadol prn- #12 tablets given.

## 2010-10-12 NOTE — Patient Instructions (Signed)
Use hydrocortisone cream to the area on back of neck. Apply ice to this area to relieve pain.   Use tramadol as directed.

## 2010-10-12 NOTE — Progress Notes (Signed)
  Subjective:    Patient ID: [name], male    DOB: Apr 14, 1960, 50 y.o.   MRN: [id number]  HPI Neck Pain- Spot on back of right neck swollen, rash present on back of right head/neck earlier per pt- now resolved.  Feels like "electricity" in skin of back right side of head.  Can't sleep due to discomfort.  Has had x 10 days.  Seems to be getting worse.  The pain is constant-  Intensity varies.  Has never had this before.  Nothing makes it better. Nothing makes it worse.  + pressure behind right eye. + mild headache "all over".     Review of Systems No fever. No n/v.  + mild headache.  No vision change.    Objective:   Physical Exam  Constitutional: He is oriented to person, place, and time. He appears well-developed and well-nourished.  HENT:  Mouth/Throat: No oropharyngeal exudate.       Fundoscopic exam wnl  Eyes: Conjunctivae and EOM are normal. Pupils are equal, round, and reactive to light. Right eye exhibits no discharge. Left eye exhibits no discharge.  Neck:       3-7mm lymph node palpable at right posterior lateral neck area- close to hair line. Lymph node moveable.  No rash on neck or on back of right head.    Cardiovascular: Normal rate and regular rhythm.   No murmur heard. Pulmonary/Chest: Effort normal and breath sounds normal. No respiratory distress.  Neurological: He is alert and oriented to person, place, and time.          Assessment & Plan:

## 2010-11-29 ENCOUNTER — Other Ambulatory Visit: Payer: Self-pay | Admitting: Family Medicine

## 2010-11-29 DIAGNOSIS — I10 Essential (primary) hypertension: Secondary | ICD-10-CM

## 2010-11-29 NOTE — Telephone Encounter (Signed)
Refill request

## 2010-12-01 ENCOUNTER — Other Ambulatory Visit: Payer: Self-pay | Admitting: Family Medicine

## 2010-12-02 NOTE — Telephone Encounter (Signed)
Refill request

## 2010-12-31 LAB — URINALYSIS, ROUTINE W REFLEX MICROSCOPIC
Bilirubin Urine: NEGATIVE
Nitrite: NEGATIVE
Specific Gravity, Urine: 1.001 — ABNORMAL LOW
Urobilinogen, UA: 0.2

## 2011-01-08 LAB — POCT CARDIAC MARKERS
Myoglobin, poc: 67.6
Troponin i, poc: <0.05

## 2011-01-08 LAB — CBC
Hemoglobin: 15
Platelets: 324
RDW: 14.2

## 2011-01-08 LAB — BASIC METABOLIC PANEL
BUN: 11
Calcium: 8.9
GFR calc non Af Amer: >60
Glucose, Bld: 79
Sodium: 135

## 2011-01-08 LAB — DIFFERENTIAL
Basophils Absolute: 0.1
Lymphocytes Relative: 47 — ABNORMAL HIGH
Neutro Abs: 3.3

## 2011-07-17 ENCOUNTER — Ambulatory Visit (INDEPENDENT_AMBULATORY_CARE_PROVIDER_SITE_OTHER): Payer: Self-pay | Admitting: Family Medicine

## 2011-07-17 ENCOUNTER — Encounter: Payer: Self-pay | Admitting: Family Medicine

## 2011-07-17 VITALS — BP 154/92 | HR 61 | Temp 98.0°F | Ht 65.0 in | Wt 171.2 lb

## 2011-07-17 DIAGNOSIS — G4700 Insomnia, unspecified: Secondary | ICD-10-CM | POA: Insufficient documentation

## 2011-07-17 DIAGNOSIS — N50819 Testicular pain, unspecified: Secondary | ICD-10-CM

## 2011-07-17 DIAGNOSIS — R350 Frequency of micturition: Secondary | ICD-10-CM

## 2011-07-17 DIAGNOSIS — N509 Disorder of male genital organs, unspecified: Secondary | ICD-10-CM

## 2011-07-17 DIAGNOSIS — I10 Essential (primary) hypertension: Secondary | ICD-10-CM

## 2011-07-17 DIAGNOSIS — R109 Unspecified abdominal pain: Secondary | ICD-10-CM

## 2011-07-17 LAB — CBC
MCHC: 32.8 g/dL (ref 30.0–36.0)
RDW: 14.2 % (ref 11.5–15.5)

## 2011-07-17 LAB — POCT URINALYSIS DIPSTICK
Leukocytes, UA: NEGATIVE
Nitrite, UA: NEGATIVE
Protein, UA: NEGATIVE
pH, UA: 6.5

## 2011-07-17 LAB — COMPREHENSIVE METABOLIC PANEL
AST: 18 U/L (ref 0–37)
Albumin: 4.4 g/dL (ref 3.5–5.2)
Alkaline Phosphatase: 47 U/L (ref 39–117)
BUN: 12 mg/dL (ref 6–23)
Potassium: 4.2 mEq/L (ref 3.5–5.3)
Sodium: 138 mEq/L (ref 135–145)
Total Protein: 7.2 g/dL (ref 6.0–8.3)

## 2011-07-17 MED ORDER — IMIPRAMINE HCL 25 MG PO TABS: 25.0000 mg | ORAL_TABLET | Freq: Every day | ORAL | Status: DC

## 2011-07-17 MED ORDER — ENALAPRIL MALEATE 10 MG PO TABS: 10.0000 mg | ORAL_TABLET | Freq: Every day | ORAL | Status: DC

## 2011-07-17 NOTE — Assessment & Plan Note (Signed)
Recurrent problem without any red flags - check labs and UA.  Trial of imipramine to see if will help with this and insomnia and anxiety

## 2011-07-17 NOTE — Progress Notes (Signed)
  Subjective:    Patient ID: [name], male    DOB: [birth year], 51 y.o.   MRN: [id number]  HPI  Urinary frequnecy Has had on and off for years but is worse recently.  No hematuria or burning or fever.  Has tried numerous medications in past and has been seen by urology  Testicle pain Has pain in his right testicle - has had on and off for years.  No mass or trauma  Insomnia Difficulty getting to sleep. Is often worried about his health and career   R flank pain Has on and off.  Concerned about hepatitis he saw on TV.  No jaundice no nausea or vomiting Pain comes and goes  Review of Symptoms - see HPI  PMH - Smoking status noted.     Review of Systems     Objective:   Physical Exam  Heart - Regular rate and rhythm.  No murmurs, gallops or rubs.    Lungs:  Normal respiratory effort, chest expands symmetrically. Lungs are clear to auscultation, no crackles or wheezes. Abdomen: soft and non-tender without masses, organomegaly or hernias noted.  No guarding or rebound Skin:  Intact without suspicious lesions or rashes Groin - both testicles are descended wihout mass.  His R testicle is mildly tender diffusely.  No hernia     Assessment & Plan:

## 2011-07-17 NOTE — Assessment & Plan Note (Signed)
Recurrent.   No red flags.  Had normal CT last year.  Will check UA and labs

## 2011-07-17 NOTE — Assessment & Plan Note (Signed)
Recurrent - no red flags on exam.  Check UA

## 2011-07-17 NOTE — Assessment & Plan Note (Signed)
Likely related to anxiety.  Will try imipramine to help with this and his urinary frequency

## 2011-07-17 NOTE — Patient Instructions (Addendum)
I will call you if your lab tests are not normal.  Otherwise we will discuss them at your next visit.  Slowly increase the imipramine to 3 tabs each evening.   It should help with sleep and after 2 weeks some with your urinary frequency  Take your enalapril every day

## 2011-08-02 ENCOUNTER — Telehealth: Payer: Self-pay | Admitting: Family Medicine

## 2011-08-02 NOTE — Telephone Encounter (Signed)
Patient is calling to let his MD know that the imipramine (TOFRANIl), but he has had headaches, blurred vision and constipation so he stopped taking the medication.  He has a FU apt on 5/8 and will keep that appt to discuss further.

## 2011-08-14 ENCOUNTER — Ambulatory Visit (INDEPENDENT_AMBULATORY_CARE_PROVIDER_SITE_OTHER): Payer: Self-pay | Admitting: Family Medicine

## 2011-08-14 ENCOUNTER — Encounter: Payer: Self-pay | Admitting: Family Medicine

## 2011-08-14 VITALS — BP 124/81 | HR 84 | Temp 98.0°F | Ht 65.0 in | Wt 173.0 lb

## 2011-08-14 DIAGNOSIS — R350 Frequency of micturition: Secondary | ICD-10-CM

## 2011-08-14 DIAGNOSIS — K6289 Other specified diseases of anus and rectum: Secondary | ICD-10-CM

## 2011-08-14 MED ORDER — TRAMADOL HCL 50 MG PO TABS: 50.0000 mg | ORAL_TABLET | Freq: Four times a day (QID) | ORAL | Status: AC | PRN

## 2011-08-14 MED ORDER — HYOSCYAMINE SULFATE 0.125 MG PO TABS
0.1250 mg | ORAL_TABLET | Freq: Two times a day (BID) | ORAL | Status: DC | PRN
Start: 2011-08-14 — End: 2013-07-19

## 2011-08-14 NOTE — Assessment & Plan Note (Signed)
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

## 2011-08-14 NOTE — Progress Notes (Signed)
  Subjective:    Patient ID: [name], male    DOB: 10/16/1960, 51 y.o.   MRN: [id number]  HPI  Frequent Urination Imipramine helped with this but made him constipated so he stopped.  Continues to have freq sudden urges to urinate.  No bleeding no dysuria   Rectal Pain Has been bad the last week during his exams.  No fever no bleeding no discharge,  Thinks the imipramine may have helped that  Insomnia Imipramine did help.  No shortness of breath or apnea with sleeping  Review of Symptoms - see HPI  PMH - Smoking status noted.   Reviewed his prior work up with Dr Jarold Motto    Review of Systems     Objective:   Physical Exam Rectal - very mildly tender prostate which is soft with masses  Genitals - uncircumcised, testes without masses no lesions       Assessment & Plan:

## 2011-08-14 NOTE — Patient Instructions (Signed)
Take the imipramine 1-2 tabs as needed for the urination and sleep.  If taking 2 then take a stool softner and or miralax to help with the constipation  If you notice any blood in your urine or bowel movements or change in the size of bowel movement then come back for a check  Come back in 6 mo for a blood pressure visit

## 2011-08-14 NOTE — Assessment & Plan Note (Signed)
Unchanged.  No signs of infection or cancer or polyuria.  Retry imipramine at low dose which did help.  Discussed dealing with constipation side effects

## 2012-02-05 IMAGING — CT CT ABD-PEL WO/W CM
3 of 6 series · 15 of 46 positions shown, 17 images · IV contrast (APPLIED)
Comparison: 06/30/2007

CLINICAL DATA: Right flank and abdomen pain.

CT ABDOMEN AND PELVIS WITHOUT AND WITH CONTRAST
TECHNIQUE: Multidetector CT imaging of the abdomen and pelvis was
performed without contrast material in one or both body regions,
followed by contrast material(s) and further sections in one or
both body regions.
Contrast: 100 ml [id number] and oral contrast

[Series 2: renal w/o 3.0 b31f st · axial · non-contrast · 0.77mm/px · z∈[-469,-100]mm · 10 of 151 slices shown, 12 images]
[im 14/151  soft-tissue]
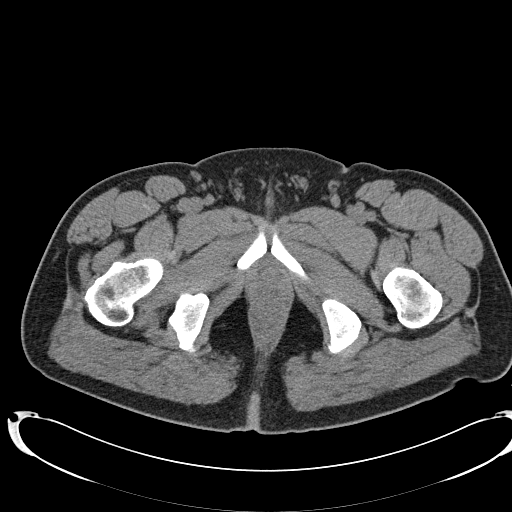
[im 14/151  bone]
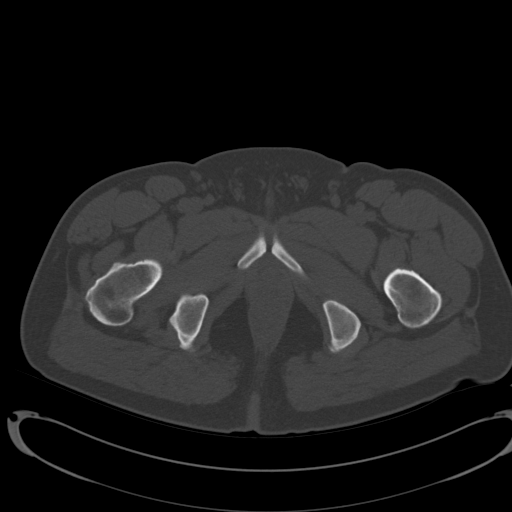
[im 28/151  soft-tissue]
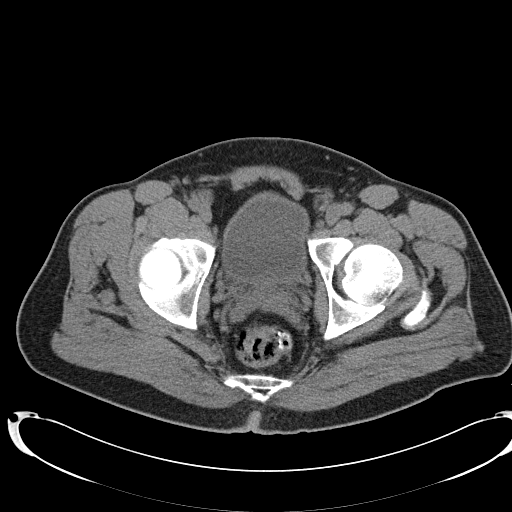
[im 41/151  soft-tissue]
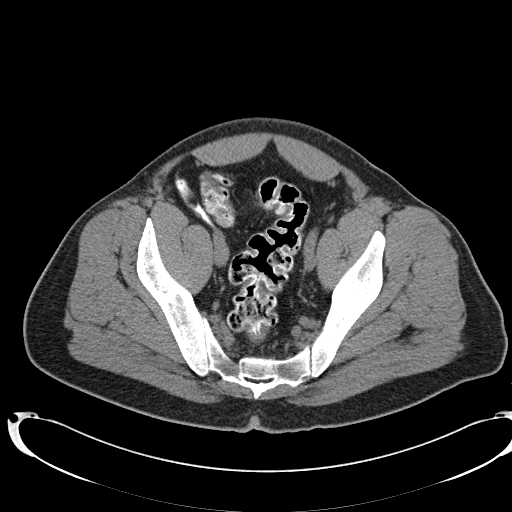
[im 55/151  soft-tissue]
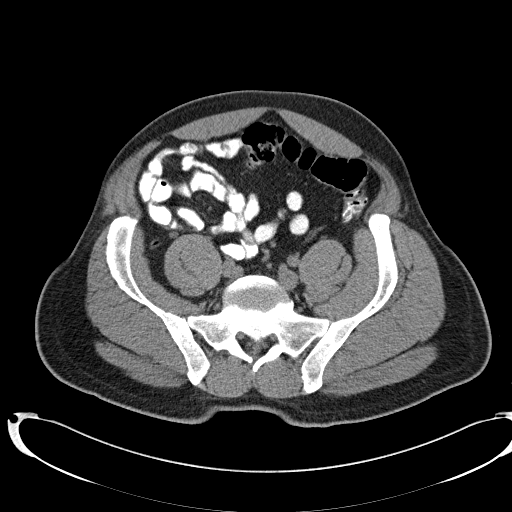
[im 69/151  soft-tissue]
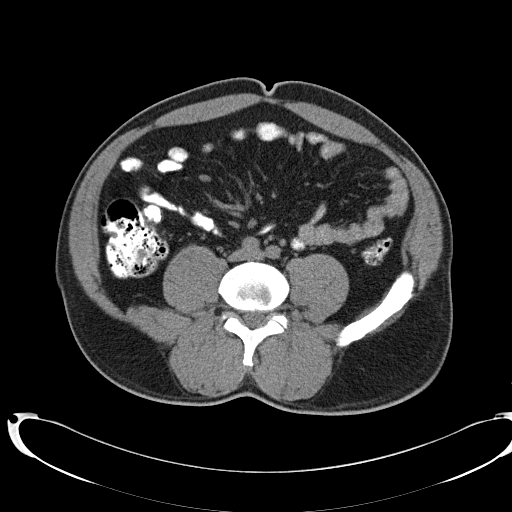
[im 82/151  soft-tissue]
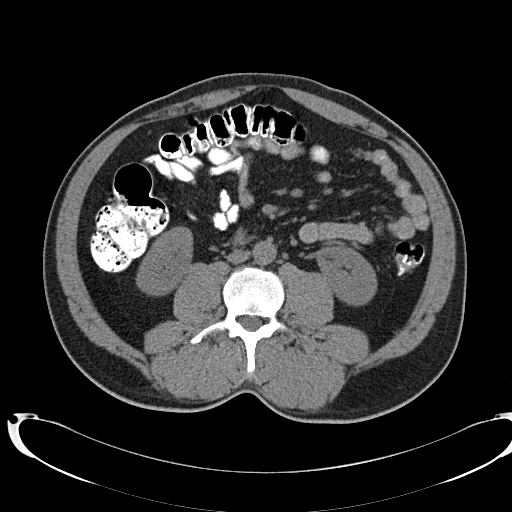
[im 96/151  soft-tissue]
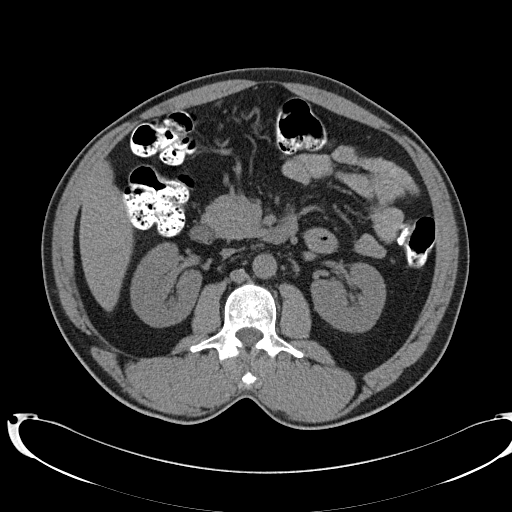
[im 110/151  soft-tissue]
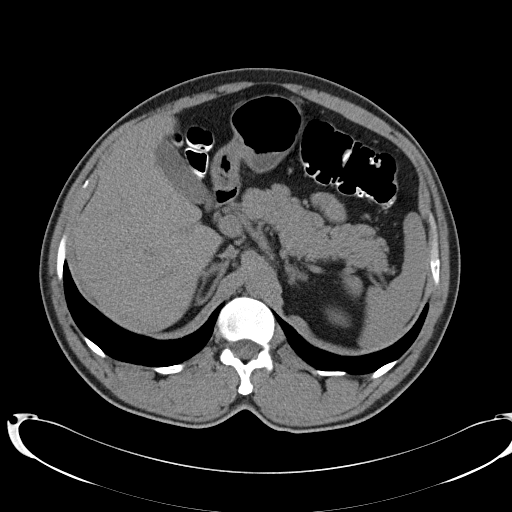
[im 123/151  soft-tissue]
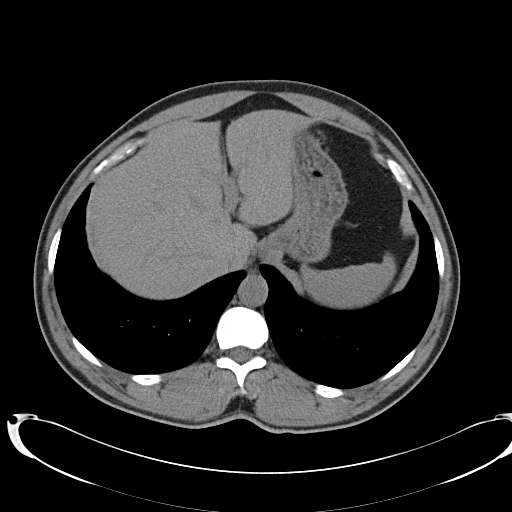
[im 123/151  bone]
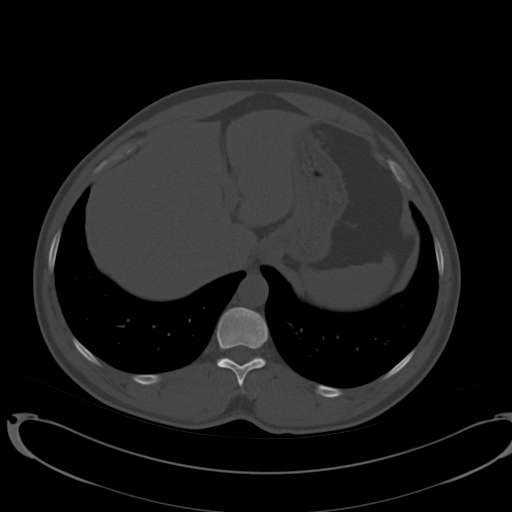
[im 137/151  soft-tissue]
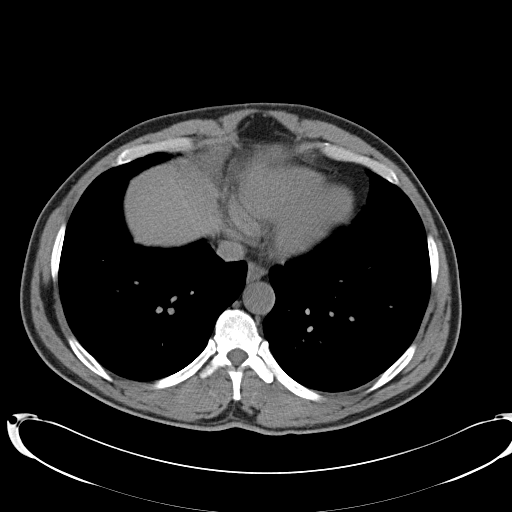

[Series 4: nephrographic 3.0 b60f st · axial · 0.76mm/px · z∈[-160,-109]mm · 2 of 52 slices shown]
[im 18/52  soft-tissue]
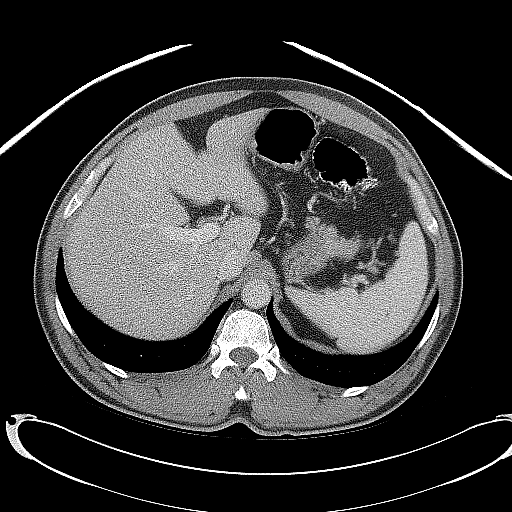
[im 35/52  soft-tissue]
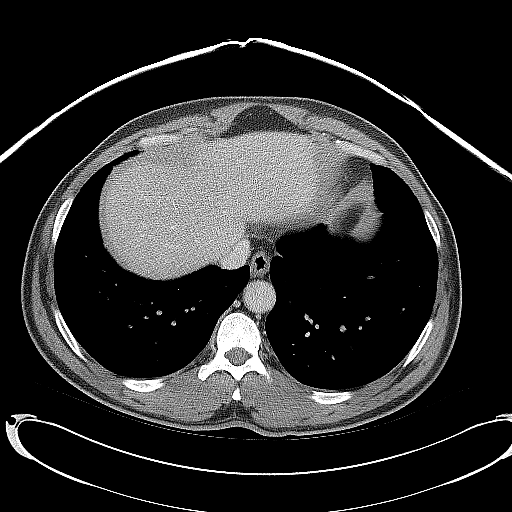

[Series 602: coronal · coronal · 0.88mm/px · 3 of 136 slices shown]
[im 46/136  soft-tissue]
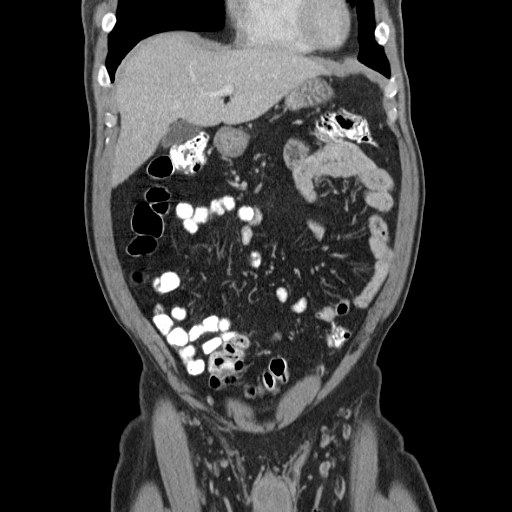
[im 61/136  soft-tissue]
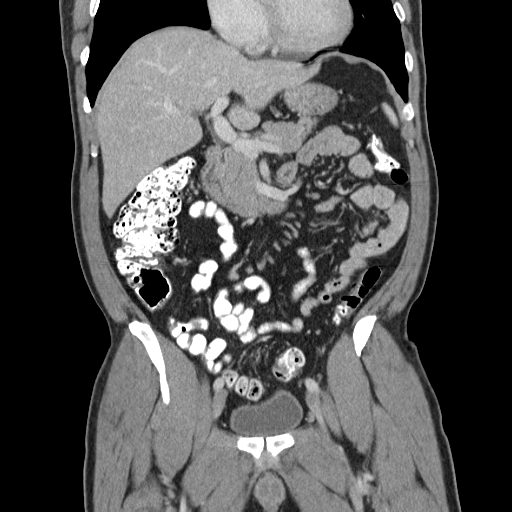
[im 76/136  soft-tissue]
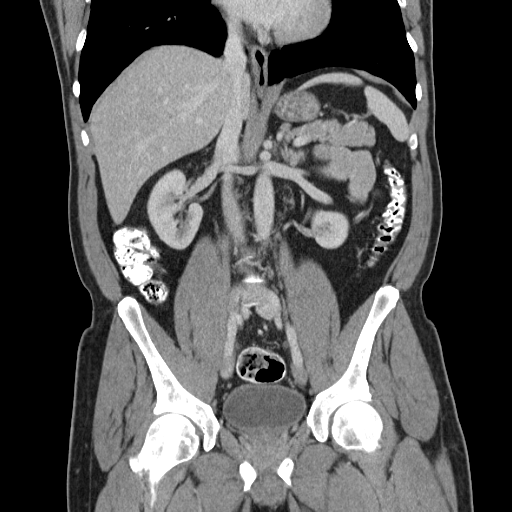

[15 of 46 positions shown; findings below may reference images not displayed]

FINDINGS: Precontrast images show a 3 mm nonobstructing calculus in
the mid pole of the left kidney.  No other renal calculi are
identified.  There is no evidence of hydronephrosis.  There is no
evidence of ureteral calculi or dilatation.  No calculi are
identified within the bladder.

Dynamic postcontrast imaging shows no evidence of renal mass
lesions.  Delayed excretory phase images show a normal appearance
of the the renal collecting systems.

No soft tissue masses or lymphadenopathy are identified within the
abdomen or pelvis.  The liver, gallbladder, spleen, pancreas, and
adrenal glands are normal in appearance.

There is no evidence of inflammatory process or abnormal fluid
collections within the abdomen or pelvis.  No evidence of bowel
wall thickening or dilatation.  Normal appendix is visualized.
IMPRESSION: 1.  Stable tiny nonobstructive left nephrolithiasis.
2.  No acute findings or other significant abnormality within the
abdomen or pelvis.

## 2012-04-25 ENCOUNTER — Other Ambulatory Visit: Payer: Self-pay | Admitting: Family Medicine

## 2012-11-06 ENCOUNTER — Other Ambulatory Visit: Payer: Self-pay | Admitting: Family Medicine

## 2012-11-07 ENCOUNTER — Other Ambulatory Visit: Payer: Self-pay | Admitting: Family Medicine

## 2012-11-09 ENCOUNTER — Other Ambulatory Visit: Payer: Self-pay | Admitting: Family Medicine

## 2013-01-12 ENCOUNTER — Other Ambulatory Visit: Payer: Self-pay | Admitting: Family Medicine

## 2013-02-15 ENCOUNTER — Other Ambulatory Visit: Payer: Self-pay | Admitting: Family Medicine

## 2013-03-24 ENCOUNTER — Ambulatory Visit (INDEPENDENT_AMBULATORY_CARE_PROVIDER_SITE_OTHER): Payer: Self-pay | Admitting: Family Medicine

## 2013-03-24 ENCOUNTER — Encounter: Payer: Self-pay | Admitting: Family Medicine

## 2013-03-24 VITALS — BP 171/96 | HR 80 | Temp 98.7°F | Wt 178.0 lb

## 2013-03-24 DIAGNOSIS — I10 Essential (primary) hypertension: Secondary | ICD-10-CM

## 2013-03-24 DIAGNOSIS — R350 Frequency of micturition: Secondary | ICD-10-CM

## 2013-03-24 DIAGNOSIS — N401 Enlarged prostate with lower urinary tract symptoms: Secondary | ICD-10-CM

## 2013-03-24 DIAGNOSIS — R739 Hyperglycemia, unspecified: Secondary | ICD-10-CM | POA: Insufficient documentation

## 2013-03-24 DIAGNOSIS — Z23 Encounter for immunization: Secondary | ICD-10-CM

## 2013-03-24 DIAGNOSIS — R7309 Other abnormal glucose: Secondary | ICD-10-CM

## 2013-03-24 DIAGNOSIS — K0889 Other specified disorders of teeth and supporting structures: Secondary | ICD-10-CM

## 2013-03-24 DIAGNOSIS — K089 Disorder of teeth and supporting structures, unspecified: Secondary | ICD-10-CM

## 2013-03-24 DIAGNOSIS — N138 Other obstructive and reflux uropathy: Secondary | ICD-10-CM

## 2013-03-24 LAB — COMPREHENSIVE METABOLIC PANEL
Albumin: 4.5 g/dL (ref 3.5–5.2)
Alkaline Phosphatase: 51 U/L (ref 39–117)
BUN: 12 mg/dL (ref 6–23)
Calcium: 9.3 mg/dL (ref 8.4–10.5)
Glucose, Bld: 107 mg/dL — ABNORMAL HIGH (ref 70–99)
Potassium: 4 mEq/L (ref 3.5–5.3)

## 2013-03-24 MED ORDER — TAMSULOSIN HCL 0.4 MG PO CAPS: 0.4000 mg | ORAL_CAPSULE | Freq: Every day | ORAL | Status: DC

## 2013-03-24 MED ORDER — ENALAPRIL MALEATE 10 MG PO TABS: ORAL_TABLET | ORAL | Status: DC

## 2013-03-24 MED ORDER — CHLORHEXIDINE GLUCONATE 0.12 % MT SOLN: 15.0000 mL | Freq: Two times a day (BID) | OROMUCOSAL | Status: DC

## 2013-03-24 MED ORDER — AMOXICILLIN 875 MG PO TABS: 875.0000 mg | ORAL_TABLET | Freq: Two times a day (BID) | ORAL | Status: DC

## 2013-03-24 NOTE — Addendum Note (Signed)
Addended by: Henri Medal on: 03/24/2013 11:58 AM   Modules accepted: Orders

## 2013-03-24 NOTE — Assessment & Plan Note (Signed)
DRE w/ uniformly enlarged prostate Symptomatic urinary frequency Starting flomax

## 2013-03-24 NOTE — Assessment & Plan Note (Signed)
Likely pain from exposed nerves from cavity but cannot r/o infection given gingival involement and worsening symptoms Not extreme in fection so will not Rx Clinda but will Rx Amox Chlorhexidine mouth wash

## 2013-03-24 NOTE — Patient Instructions (Addendum)
Thank you for coming in today Please start your blood pressure medicaiton. This is very important to prevent heart attack, stroke, headaches, and kidney disease Pelase start the flomax for your urinary complaints Please start the amoxicillin and mouthwash for the dental pain. Try to get in to see a dentist to have that cavity filled We will let you know if any of your blood work comes back abnormal Please come back in 1-2 months to follow up with Dr. Deirdre Priest.

## 2013-03-24 NOTE — Assessment & Plan Note (Signed)
Significant elevation above JNC 8 guideline levels Off emds Restart Enalapril CMET today to assess renal function

## 2013-03-24 NOTE — Progress Notes (Signed)
[name] is a 52 y.o. male who presents to [facility] today for HTN  HTN: Stopped 2 mo ago. Told by pharmacy that needs to see MD for refill. Deneis CP, SOB, Syncope, HA.  Dental pain: painful 24/7. Started 4 wks ago. gettign worse. No foul taste in mouth. No purulent/bloody discharge. Gum swelling. Worse w/ hot or cold foods/liquids. Eats on L side. Last appt at dentist 7 years ago.   Urination: frequent urination for several years. Small volume. Does not feel like empties bladder. Voids about 10 times a day. No back pain. No dysuria. Feels urge to urinate and needs to go quickly  The following portions of the patient's history were reviewed and updated as appropriate: allergies, current medications, past medical history, family and social history, and problem list.  Patient is a nonsmoker.  Health Maintenance: refusing flu shot today. TDAP today  Past Medical History  Diagnosis Date  . Abdominal pain 2009    CT abd pelvis     ROS as above otherwise neg.    Medications reviewed. Current Outpatient Prescriptions  Medication Sig Dispense Refill  . amoxicillin (AMOXIL) 875 MG tablet Take 1 tablet (875 mg total) by mouth 2 (two) times daily.  20 tablet  0  . aspirin 81 MG EC tablet Take 81 mg by mouth daily.        . chlorhexidine (PERIDEX) 0.12 % solution Use as directed 15 mLs in the mouth or throat 2 (two) times daily.  120 mL  0  . enalapril (VASOTEC) 10 MG tablet TAKE ONE TABLET BY MOUTH EVERY DAY  90 tablet  1  . hyoscyamine (LEVSIN) 0.125 MG tablet Take 1 tablet (0.125 mg total) by mouth 2 (two) times daily as needed. For abdominal pain attack  30 tablet  11  . imipramine (TOFRANIL) 25 MG tablet Take 1 tablet (25 mg total) by mouth at bedtime. Increase to 2 tabs in 3 days then in 2 more day 3 tabs each evening  30 tablet  11  . tamsulosin (FLOMAX) 0.4 MG CAPS capsule Take 1 capsule (0.4 mg total) by mouth daily.  30 capsule  3   No current facility-administered medications for  this visit.    Exam: BP 171/96  Pulse 80  Temp(Src) 98.7 F (37.1 C) (Oral)  Wt 178 lb (80.74 kg) Gen: Well NAD HEENT: EOMI,  MMM, R last maxillary molar w/ cavitary lesions and gum tenderness. No purulent/bloody discharge.  GU: DRE w/ uniformly slightly enlarged prostate, no gross blood  No results found for this or any previous visit (from the past 72 hour(s)).  A/P (as seen in Problem list)  HYPERTENSION, BENIGN ESSENTIAL Significant elevation above JNC 8 guideline levels Off emds Restart Enalapril CMET today to assess renal function   BENIGN PROSTATIC HYPERTROPHY, WITH OBSTRUCTION DRE w/ uniformly enlarged prostate Symptomatic urinary frequency Starting flomax   Urinary frequency Last UA w/ symptoms Likely from BPH Starting flomax (consider antispasmodic if no improvement in urgency)  Hyperglycemia Pt very concerned about DM given urinary frequency and intermittent feet burning Hyperglycemia on labs in past Pt reasured that unlikey to have DM but very concerned A1c today  Pain, dental Likely pain from exposed nerves from cavity but cannot r/o infection given gingival involement and worsening symptoms Not extreme in fection so will not Rx Clinda but will Rx Amox Chlorhexidine mouth wash

## 2013-03-24 NOTE — Assessment & Plan Note (Signed)
Last UA w/ symptoms Likely from BPH Starting flomax (consider antispasmodic if no improvement in urgency)

## 2013-03-24 NOTE — Assessment & Plan Note (Signed)
Pt very concerned about DM given urinary frequency and intermittent feet burning Hyperglycemia on labs in past Pt reasured that unlikey to have DM but very concerned A1c today

## 2013-04-05 ENCOUNTER — Telehealth: Payer: Self-pay | Admitting: Family Medicine

## 2013-04-05 NOTE — Telephone Encounter (Signed)
Will forward to MD that ordered labs. Joss Mcdill,CMA

## 2013-04-05 NOTE — Telephone Encounter (Signed)
Pt called and would like someone to go over his blood work results jw

## 2013-04-07 NOTE — Telephone Encounter (Signed)
FYI if pt calls  Attempted to call pt at both numbers listed in chart, but no answer or # disconnected Pt labs only remarkable for A1c of 5.7 which in preDM range. Nothing to do other than encourage diet adn exercise.

## 2013-04-13 ENCOUNTER — Telehealth: Payer: Self-pay | Admitting: Family Medicine

## 2013-04-13 NOTE — Telephone Encounter (Signed)
Pt called and would like someone to call him with her lab results. jw

## 2013-04-13 NOTE — Telephone Encounter (Signed)
Pt informed of 5.7 A1c (see previous phone note).  Mailed info on this per patient request. Kerah Hardebeck, Salome Spotted

## 2013-05-19 ENCOUNTER — Ambulatory Visit: Payer: No Typology Code available for payment source

## 2013-07-19 ENCOUNTER — Telehealth: Payer: Self-pay | Admitting: *Deleted

## 2013-07-19 ENCOUNTER — Encounter: Payer: Self-pay | Admitting: Family Medicine

## 2013-07-19 ENCOUNTER — Ambulatory Visit (INDEPENDENT_AMBULATORY_CARE_PROVIDER_SITE_OTHER): Payer: No Typology Code available for payment source | Admitting: Family Medicine

## 2013-07-19 ENCOUNTER — Ambulatory Visit (HOSPITAL_COMMUNITY)
Admission: RE | Admit: 2013-07-19 | Discharge: 2013-07-19 | Disposition: A | Discharge status: Home / Self Care | Payer: No Typology Code available for payment source | Source: Ambulatory Visit | Attending: Family Medicine | Admitting: Family Medicine

## 2013-07-19 VITALS — BP 123/73 | HR 63 | Temp 98.7°F | Ht 65.0 in | Wt 169.9 lb

## 2013-07-19 DIAGNOSIS — R05 Cough: Secondary | ICD-10-CM | POA: Insufficient documentation

## 2013-07-19 DIAGNOSIS — G589 Mononeuropathy, unspecified: Secondary | ICD-10-CM

## 2013-07-19 DIAGNOSIS — R0989 Other specified symptoms and signs involving the circulatory and respiratory systems: Secondary | ICD-10-CM | POA: Insufficient documentation

## 2013-07-19 DIAGNOSIS — R059 Cough, unspecified: Secondary | ICD-10-CM | POA: Insufficient documentation

## 2013-07-19 DIAGNOSIS — G5793 Unspecified mononeuropathy of bilateral lower limbs: Secondary | ICD-10-CM | POA: Insufficient documentation

## 2013-07-19 DIAGNOSIS — G629 Polyneuropathy, unspecified: Secondary | ICD-10-CM

## 2013-07-19 DIAGNOSIS — R7309 Other abnormal glucose: Secondary | ICD-10-CM

## 2013-07-19 DIAGNOSIS — I10 Essential (primary) hypertension: Secondary | ICD-10-CM

## 2013-07-19 DIAGNOSIS — R739 Hyperglycemia, unspecified: Secondary | ICD-10-CM

## 2013-07-19 LAB — LIPID PANEL
CHOL/HDL RATIO: 4.9 ratio
Cholesterol: 182 mg/dL (ref 0–200)
HDL: 37 mg/dL — AB (ref >39)
LDL CALC: 119 mg/dL — AB (ref 0–99)
Triglycerides: 130 mg/dL (ref <150)
VLDL: 26 mg/dL (ref 0–40)

## 2013-07-19 LAB — POCT GLYCOSYLATED HEMOGLOBIN (HGB A1C): Hemoglobin A1C: 5.8

## 2013-07-19 MED ORDER — PREGABALIN 50 MG PO CAPS: 50.0000 mg | ORAL_CAPSULE | Freq: Three times a day (TID) | ORAL | Status: DC

## 2013-07-19 NOTE — Assessment & Plan Note (Signed)
A: well controlled today. P: Trial off ACE for cough. F/u in two weeks if elevated consider restarting ACE i vs switching to thiazide diuretic.

## 2013-07-19 NOTE — Telephone Encounter (Signed)
Attempted to call patient. Both time phone rings 2-3 times then goes silent

## 2013-07-19 NOTE — Assessment & Plan Note (Signed)
>>  ASSESSMENT AND PLAN FOR NEUROPATHY WRITTEN ON 07/19/2013 12:15 PM BY Dessa Phi C, MD  A: persistent x 6 months or more. No improvement with gabapentin in the past.  Repeated A1c, not diabetic Lipids done today as well  P: Trial of lyrica.

## 2013-07-19 NOTE — Telephone Encounter (Signed)
Message copied by Johny Shears on Mon Jul 19, 2013  4:39 PM ------      Message from: Boykin Nearing      Created: Mon Jul 19, 2013  1:40 PM       Normal CXR.      Please inform patient.        ------

## 2013-07-19 NOTE — Progress Notes (Signed)
Subjective:    Patient ID: [name], male    DOB: 06/03/1960, 53 y.o.   MRN: [id number]  HPI Patient presents with complaints of cough for 15 days.  Patient states that cough is productive and mucus is thick, yellow and gives him a 'bad taste' in mouth, and has a dry mouth.  Patient states that he has mucus in throat and occasionally awakens with shortness of breath.  Patient states that he has tried Robitusin cough syrup, Guaifenesin tablets and Claritin without relieve from cough.  States that he drinks water to relieve his dry mouth.      2. B/l foot pain: pain and burning x > 6 months. Worse on plantar aspect of feet. Worse after running x 40 mins and worse at night when feet are elevated.  No injury. No history of rash. No DM2. Patient has been tried on gabapentin in the past w.o improvement.  Pain is no ascending.   Patient denies fever, chest pain. He admits to URI about 6 weeks ago. He is an ex-heavy smoker of 3 PPD x 28 yrs. He quit in 2000.     Review of Systems   Constitutional:  Patient has stable weight. Reports good appetite. Sleeps well when takes sleeping pill to help with pain in feet.  No complaints of fatigue or weakness.  Exercises regularly using treadmill.   Skin:  Denies rashes, moles, sores or any other skin changes.  Denies pruritus and dry skin. HEENT:  Denies headaches, head trauma, migraines and vision changes.  Denies hearing changes.  Reports rhinorrhea, and denies epistaxis and sinus problems.  Denies sore throat, hoarseness or dysphagia.  Denies dizziness, vertigo or hearing problems.  Denies mouth ulcers or lesions.  Denies neck masses. Respiratory:  Reports shortness of breath occasionally upon awakening, related to cough.  Denies SOB, wheezing, stridor or chest pain.  Denies history of COPD and TB.  Denies history of DVT or PE. Cardiovascular:  Denies chest pain, palpations, cyanosis, edema, claudication and leg cramps. Gastrointestional:  Denies  nausea, vomiting, diarrhea, constipation, and abdominal pain.  Denies changes in stool patterns, and change in appetite. Geniourinary:  Denies urine incontinence, flank pain and dysuria. Musculoskeletal:  Reports numbness and pain in lower legs and feet.  Denies muscle weakness, or numbness and pain in all other extremities.  Patient denies SI.     Objective:   Physical Exam BP 123/73  Pulse 63  Temp(Src) 98.7 F (37.1 C) (Oral)  Ht 5\' 5"  (1.651 m)  Wt 169 lb 14.4 oz (77.066 kg)  BMI 28.27 kg/m2 General appearance: alert, cooperative and no distress Head: Normocephalic, without obvious abnormality, atraumatic Eyes: Conjunctivae and corneas clear; PEERLA Ears: TM pearly-gray and intact; no signs of infection, erythma and swelling noted. Nose: Nares normal. Septum midline. Mucosa normal. No drainage or sinus tenderness., left turbinate Nares symmetrical with no sores or lesions noted.  Septum midline.  Mucosal pink with no drainage or sinus tenderness; no polyps, no crusting or bleeding noted., no sinus tenderness, no polyps, no crusting or bleeding points Throat: lips, mucosa, and tongue normal; teeth and gums normal Neck: no adenopathy, no carotid bruit, no JVD, supple, symmetrical, trachea midline and thyroid not enlarged, symmetric, no tenderness/mass/nodules Lungs: clear to auscultation bilaterally Chest wall: no tenderness,  Heart: regular rate and rhythm, S1, S2 normal, no murmur, click, rub or gallop Abdomen: soft, non-tender; bowel sounds normal; no masses,  no organomegaly Pulses: 2+ and symmetric Skin: Skin color, texture, turgor  normal. No rashes or lesions Lymph nodes: Cervical, supraclavicular, and axillary nodes normal.    BP 123/73  Pulse 63  Temp(Src) 98.7 F (37.1 C) (Oral)  Ht 5\' 5"  (1.651 m)  Wt 169 lb 14.4 oz (77.066 kg)  BMI 28.27 kg/m2 General appearance: alert, cooperative and no distress Ears: normal TM's and external ear canals both ears Nose: no  discharge, turbinates pale, swollen Throat: lips, mucosa, and tongue normal; teeth and gums normal Neck: no adenopathy and supple, symmetrical, trachea midline Lungs: clear to auscultation bilaterally Chest wall: no tenderness Abdomen: soft, non-tender; bowel sounds normal; no masses,  no organomegaly Ext: no edema. no foot lesions 2+ DP and PT pulses. L 5th toenail thickened and yellow.   Lab Results  Component Value Date   HGBA1C 5.8 07/19/2013      Assessment & Plan:  Patient to discontinue Vasotec 10mg  to evaluate origin of cough.  Order for patient to get CXR today.    Patient previously taken Neurontin for legs and feet discomfort.  Patient  to begin taking Lyrica 50mg  TID, for lower legs and feet pain and numbness.    Lipid panel drawn today.  A1C lab results = 5.8 today.  Patient to follow up with primary physician in 2 weeks.  Attending Addendum  I examined the patient and discussed the assessment and plan with NP student Kathe Becton. I have reviewed the note and agree.    Boykin Nearing, [place]

## 2013-07-19 NOTE — Assessment & Plan Note (Signed)
A: persistent x 6 months or more. No improvement with gabapentin in the past.  Repeated A1c, not diabetic Lipids done today as well  P: Trial of lyrica.

## 2013-07-19 NOTE — Patient Instructions (Signed)
[name],  Thank you for coming in today.  Regarding cough:  1. Could be post infectious-related to cold. Other possible causes GERD, Ace inhibitor, allergic rhinitis.   Please start by getting CXR. Stop Ace inhibitor.  I will call with CXR results. F/u in two weeks.  Regarding: elevated A1c in past. Repeat today with lipids.  Regarding: b/l foot pain and burning. Repeat A1c. Trial of lyrica 50 mg three times daily, may increase to 100 mg three times daily at 2 weeks follow up.   Dr. Adrian Blackwater

## 2013-07-19 NOTE — Assessment & Plan Note (Signed)
A: acute cough unclear etiology. Differential ACEi (less likely given timeframe, patient on Ace for > 6 months), allergic rhinitis very likely given history of allergies (previously on clairitin) and pale nasal mucosa with mild swelling, post infectious cough, GERD possible.  Will rule out PNA/mass with CXR especially given smoking history.   P:  CXR  Trial off ACEi F/u in two weeks to assess symptoms.

## 2013-07-20 ENCOUNTER — Encounter: Payer: Self-pay | Admitting: Family Medicine

## 2013-07-20 DIAGNOSIS — E786 Lipoprotein deficiency: Secondary | ICD-10-CM | POA: Insufficient documentation

## 2013-07-22 ENCOUNTER — Telehealth: Payer: Self-pay | Admitting: Family Medicine

## 2013-07-22 NOTE — Telephone Encounter (Signed)
Pt called and would like his xrays results. jw

## 2013-07-22 NOTE — Telephone Encounter (Signed)
Will forward to Dr. Adrian Blackwater who ordered the xray. Jettson Crable,CMA

## 2013-07-22 NOTE — Telephone Encounter (Signed)
Tried to call pt back but there was no answer.  Please inform of negative xray results if he calls back.  Thanks [employer]

## 2013-07-22 NOTE — Telephone Encounter (Signed)
X-ray was negative. Routed to CNA, Enid Skeens who assisted me with the patient after I reviewed it to inform patient.  Please inform patient.

## 2013-07-23 ENCOUNTER — Telehealth: Payer: Self-pay | Admitting: Family Medicine

## 2013-07-23 NOTE — Telephone Encounter (Signed)
Pt called and would like the results of his x-rays. jw

## 2013-07-23 NOTE — Telephone Encounter (Signed)
Unable to reach patient.  Phone rang and there was no answer.  Please let him know that xray was negative.  Thanks [employer]

## 2013-07-27 ENCOUNTER — Ambulatory Visit (INDEPENDENT_AMBULATORY_CARE_PROVIDER_SITE_OTHER): Payer: No Typology Code available for payment source | Admitting: Family Medicine

## 2013-07-27 ENCOUNTER — Encounter: Payer: Self-pay | Admitting: Family Medicine

## 2013-07-27 VITALS — BP 137/73 | HR 81 | Temp 98.7°F | Wt 169.0 lb

## 2013-07-27 DIAGNOSIS — R059 Cough, unspecified: Secondary | ICD-10-CM

## 2013-07-27 DIAGNOSIS — K219 Gastro-esophageal reflux disease without esophagitis: Secondary | ICD-10-CM

## 2013-07-27 DIAGNOSIS — R053 Chronic cough: Secondary | ICD-10-CM

## 2013-07-27 DIAGNOSIS — R0602 Shortness of breath: Secondary | ICD-10-CM

## 2013-07-27 DIAGNOSIS — I10 Essential (primary) hypertension: Secondary | ICD-10-CM

## 2013-07-27 DIAGNOSIS — R05 Cough: Secondary | ICD-10-CM

## 2013-07-27 MED ORDER — ALBUTEROL SULFATE HFA 108 (90 BASE) MCG/ACT IN AERS: 2.0000 | INHALATION_SPRAY | Freq: Four times a day (QID) | RESPIRATORY_TRACT | Status: DC | PRN

## 2013-07-27 MED ORDER — CHLORTHALIDONE 25 MG PO TABS: 25.0000 mg | ORAL_TABLET | Freq: Every day | ORAL | Status: DC

## 2013-07-27 MED ORDER — PANTOPRAZOLE SODIUM 40 MG PO TBEC: 40.0000 mg | DELAYED_RELEASE_TABLET | Freq: Every day | ORAL | Status: DC

## 2013-07-27 NOTE — Assessment & Plan Note (Signed)
This might be the cause of his coughing,choking and change in voice Trial of Protonix. If no improvement he will need ENT referral to assess for oropharyngeal polyps.

## 2013-07-27 NOTE — Assessment & Plan Note (Signed)
Likely part of his cough syndrome. R/O COPD vs CHF. PFT and ECHO testing. Albuterol prn SOB and wheezing prescribed, he will pick it up at his pharmacy if he can afford it.

## 2013-07-27 NOTE — Patient Instructions (Signed)
Cough, Adult  A cough is a reflex. It helps you clear your throat and airways. A cough can help heal your body. A cough can last 2 or 3 weeks (acute) or may last more than 8 weeks (chronic). Some common causes of a cough can include an infection, allergy, or a cold. HOME CARE  Only take medicine as told by your doctor.  If given, take your medicines (antibiotics) as told. Finish them even if you start to feel better.  Use a cold steam vaporizer or humidier in your home. This can help loosen thick spit (secretions).  Sleep so you are almost sitting up (semi-upright). Use pillows to do this. This helps reduce coughing.  Rest as needed.  Stop smoking if you smoke. GET HELP RIGHT AWAY IF:  You have yellowish-white fluid (pus) in your thick spit.  Your cough gets worse.  Your medicine does not reduce coughing, and you are losing sleep.  You cough up blood.  You have trouble breathing.  Your pain gets worse and medicine does not help.  You have a fever. MAKE SURE YOU:   Understand these instructions.  Will watch your condition.  Will get help right away if you are not doing well or get worse. Document Released: 12/06/2010 Document Revised: 06/17/2011 Document Reviewed: 12/06/2010 [employer] Patient Information 2014 [employer].  

## 2013-07-27 NOTE — Assessment & Plan Note (Signed)
Enalapril D/C. I started him on Chlorthalidone. Continue home BP monitoring.

## 2013-07-27 NOTE — Assessment & Plan Note (Addendum)
Chronic, etiology unclear. Differentials includes Allergy,COPD with hx of cough,CHF with hx of SOB and sudden awakening at night,Enalapril induced or reflux due to choking and irritation in his throat. Infectious process unlikely. Patient advised to d/c enalapril altogether for his HTN. ECHO ordered for LVEF to assess for CHF. Patient to schedule PFT to assess for COPD with Dr Maryjean Ka. Continue Claritin for allergy and I started Protonix for his reflux. PPD not checked due to previous negative test and no travel out of [place], he also had a negative chest xray. If cough does not improve will consider PPD testing at next visit.

## 2013-07-27 NOTE — Progress Notes (Signed)
Subjective:     Patient ID: ABE SCHOOLS, male   DOB: 02-08-1961, 53 y.o.   MRN: [id number]  HPI Cough/SOB: C/O cough for more than 20 days,she coughs anytime of the day associated with choking and SOB,cough is worse at night which makes him wake up sudden due to choking and SOB.He denies chest pain currently but when the cough is bad he has chest pain, he wheezes at time. No leg swelling.He had tried various OTC cough regimen as well as allergy medicine including Claritin,non had worked for him.He has hx of environmental allergies,usually whenever he uses Claritin he feels better but this is not working for him this time. No fever. His wife and daughter also had cough few weeks before him and they felt better. Since he started coughing he had noticed voice change,he does not choke with food,just with his cough, feels like his mouth his dry and he had to drink a lot of water.There is hx of yellowish sputum production with no blood stain. Denies hx of travel,he has been in the [place] for 15 yr. He was checked for PPD 15 yrs ago and was negative. He has been on Enalapril for 4 yrs with no S/E to medication.He smoked many years ago for about 30 yrs, he quit smoking 10 yrs ago. GERD: He gave hx of reflux which makes him cough at times, but he feels this is different from his normal reflux. He is not on medication for his reflux since he does not have any stomach pain, no blood in his stool. No change in bowel habit. HTN: On Enalapril for 4 yrs, denies any known S/E to this medication.  Current Outpatient Prescriptions on File Prior to Visit  Medication Sig Dispense Refill  . enalapril (VASOTEC) 10 MG tablet TAKE ONE TABLET BY MOUTH EVERY DAY  90 tablet  1  . aspirin 81 MG EC tablet Take 81 mg by mouth daily.        . chlorhexidine (PERIDEX) 0.12 % solution Use as directed 15 mLs in the mouth or throat 2 (two) times daily.  120 mL  0  . Ibuprofen-Diphenhydramine Cit ([employer] IBUPROFEN PM PO) Take 200 mg by  mouth. Take 1 tablet as needed for sleep      . pregabalin (LYRICA) 50 MG capsule Take 1 capsule (50 mg total) by mouth 3 (three) times daily.  90 capsule  0  . tamsulosin (FLOMAX) 0.4 MG CAPS capsule Take 1 capsule (0.4 mg total) by mouth daily.  30 capsule  3   No current facility-administered medications on file prior to visit.   Past Medical History  Diagnosis Date  . Abdominal pain 2009    CT abd pelvis       Review of Systems  Respiratory: Positive for cough, choking, shortness of breath and wheezing. Negative for chest tightness.   Cardiovascular: Negative.   Gastrointestinal: Negative.   Genitourinary: Negative.   All other systems reviewed and are negative.  Filed Vitals:   07/27/13 0856  BP: 137/73  Pulse: 81  Temp: 98.7 F (37.1 C)  TempSrc: Oral  Weight: 169 lb (76.658 kg)  SpO2: 96%       Objective:   Physical Exam  Nursing note and vitals reviewed. Constitutional: He appears well-developed. No distress.  HENT:  Right Ear: External ear normal.  Left Ear: External ear normal.  Mouth/Throat: Oropharynx is clear and moist. No oropharyngeal exudate.  Cardiovascular: Normal rate, regular rhythm, normal heart sounds and intact distal pulses.  No murmur heard. Pulmonary/Chest: Effort normal and breath sounds normal. No respiratory distress. He has no wheezes. He has no rales. He exhibits no tenderness.  Abdominal: Soft. He exhibits no distension. There is no tenderness.  Musculoskeletal: Normal range of motion. He exhibits no edema.  Neurological: He is alert.       Assessment:     Cough SOB GERD  HTN    Plan:     Check problem list

## 2013-07-30 ENCOUNTER — Encounter: Payer: Self-pay | Admitting: Pharmacist

## 2013-07-30 ENCOUNTER — Ambulatory Visit (INDEPENDENT_AMBULATORY_CARE_PROVIDER_SITE_OTHER): Payer: No Typology Code available for payment source | Admitting: Pharmacist

## 2013-07-30 VITALS — BP 130/90 | Ht 66.0 in | Wt 168.0 lb

## 2013-07-30 DIAGNOSIS — R0602 Shortness of breath: Secondary | ICD-10-CM

## 2013-07-30 MED ORDER — FLUTICASONE PROPIONATE 50 MCG/ACT NA SUSP: 2.0000 | Freq: Every day | NASAL | Status: DC

## 2013-07-30 NOTE — Assessment & Plan Note (Signed)
Lung function: Spirometry evaluation reveals above-average lung function. It is likely that patient's symptoms are allergy-mediated, and he may not have taken the loratadine regularly-enough to see a benefit. Advised patient to restart the loratadine and take daily on a scheduled basis. Started fluticasone nasal: 2 sprays in each nostril once daily, sprayed away from the middle of the nose.  Reviewed results of pulmonary function tests.  Pt verbalized understanding of results and education.  Written pt instructions provided.  F/U in pharm clinic in 2-3 weeks.   Total time in face to face counseling 30 minutes.  Patient seen with Toribio Harbour, PharmD Candidate, Silas Sacramento, PharmD Candidate and Wilfred Curtis, PharmD Resident.Marland Kitchen

## 2013-07-30 NOTE — Patient Instructions (Addendum)
Your lung test showed great lung function.   We think your symptoms may be related to allergies. We sent a prescription for fluticasone nasal spray to [facility]. Use 2 sprays in each nostril once daily; make sure to spray away from the center of your nose. We would also like you to start taking Claritin: 1 tablet daily.   Try not to take any medications containing Benadryl (diphenhydramine), since this may make your prostate symptoms worse.   Follow-up in pharm clinic in 2-3 weeks for [place] to check in on your symptoms. We would also like to get a blood test at this time since you've started a new blood pressure medication.

## 2013-07-30 NOTE — Progress Notes (Signed)
S:    Patient arrives in good spirits.    Presents for lung function evaluation. Patient reports having a mucusy cough that chokes him for over 20 days. The mucus is only of a limited quantity but is slightly-yellow. He has never experienced symptoms like this before. Drinking lots of water helps, but Claritin and OTC cold products do not help. He never picked up the albuterol inhaler he was prescribed, because he says he doesn't need it. His symptoms are worsened when he starts talking and after he talks for a while. He also loses his voice, and his throat is sore and dry. He reports experiencing mild allergy symptoms (itchy eyes) but none that are overly troublesome. He was advised to stop taking his enalapril on 4/21 because of concerns for ACEI-induced cough; since then, he reports experiencing maybe a little symptom improvement but nothing significant. No SOB reported, even with activity. He is active and regularly runs up to 10 miles at a time. Reports normal breathing- the cough is his only complaint. No family history of asthma except for in his 53 year-old child. He does have a 28-year smoking history (up to 2-3 ppd), but he quit smoking 10 years ago.    O:  See "scanned report" or Documentation Flowsheet (discrete results - PFTs) for  Spirometry results. Patient provided good effort while attempting spirometry.    A/P: Lung function: Spirometry evaluation reveals above-average lung function. It is likely that patient's symptoms are allergy-mediated, and he may not have taken the loratadine regularly-enough to see a benefit. Advised patient to restart the loratadine and take daily on a scheduled basis. Started fluticasone nasal: 2 sprays in each nostril once daily, sprayed away from the middle of the nose.   HTN: appears controlled after recent switch from enalapril to chlorthalidone; today's BP 130/90 but patient had not taken his chlorthalidone yet. Plan to check BMET at next clinic visit to  assess electrolytes and renal function.    Reviewed results of pulmonary function tests.  Pt verbalized understanding of results and education.  Written pt instructions provided.  F/U in pharm clinic in 2-3 weeks.   Total time in face to face counseling 30 minutes.  Patient seen with Toribio Harbour, PharmD Candidate, Silas [place], PharmD Candidate and Wilfred Curtis, PharmD Resident.Marland Kitchen

## 2013-08-02 ENCOUNTER — Telehealth: Payer: Self-pay | Admitting: Family Medicine

## 2013-08-02 NOTE — Progress Notes (Signed)
Patient ID: [name], male   DOB: [birth year], 53 y.o.   MRN: [id number] Reviewed: Agree with Dr. Graylin Shiver documentation and management.

## 2013-08-02 NOTE — Telephone Encounter (Signed)
Pharmacy suggested pt stop taking his blood pressure medication because of the side effects he was haivng Please advise

## 2013-08-03 ENCOUNTER — Ambulatory Visit (INDEPENDENT_AMBULATORY_CARE_PROVIDER_SITE_OTHER): Payer: No Typology Code available for payment source | Admitting: Family Medicine

## 2013-08-03 ENCOUNTER — Encounter: Payer: Self-pay | Admitting: Family Medicine

## 2013-08-03 VITALS — BP 144/87 | HR 83 | Temp 98.6°F | Ht 66.0 in | Wt 169.0 lb

## 2013-08-03 DIAGNOSIS — I10 Essential (primary) hypertension: Secondary | ICD-10-CM

## 2013-08-03 MED ORDER — LOSARTAN POTASSIUM 25 MG PO TABS: 25.0000 mg | ORAL_TABLET | Freq: Every day | ORAL | Status: DC

## 2013-08-03 NOTE — Assessment & Plan Note (Signed)
Long discussion about BP management.  Chlorthalidone switched to Losartan. Continue BP monitoring. F/u with PCP

## 2013-08-03 NOTE — Telephone Encounter (Signed)
Spoke with patient he states that he has been taking his chlorthiadone and has since had more issues.  He is still coughing, has dry mouth, constipation, and "kidney pain".  Made pt an appt to come in today with Dr. Thomes Dinning.  Jazmin Hartsell,CMA

## 2013-08-03 NOTE — Telephone Encounter (Signed)
Dr Gwendlyn Deutscher saw him most recently.  I defer to her decision  Thanks  [place]

## 2013-08-03 NOTE — Progress Notes (Signed)
Family Medicine Office Visit Note   Subjective:   Patient ID: [name], male  DOB: Jan 14, 1961, 53 y.o.. MRN: [id number]   Pt that comes today for same-day appointment complaining of medication issues. He has been evaluated for cough and his blood pressure medication was changed from enalapril to chlorthalidone. Patient reports chlorthalidone is giving him several side effects including nausea, upset stomach, decreased urinary output, and decrease in libido and he self discontinued medication. Denies rashes, HA, dizziness, or other symptoms. He reports his cough is the same and is requesting to go back to enalapril. Denies history of sulfa allergies.   Review of Systems:  Pt denies fever, chills, SOB, chest pain, palpitations, headaches, dizziness, numbness or weakness.   Objective:   Physical Exam: Gen:  NAD HEENT: Moist mucous membranes CV: Regular rate and rhythm, no murmurs rubs or gallops PULM: Clear to auscultation bilaterally. No wheezes/rales/rhonchi ABD: Soft, non tender, non distended, normal bowel sounds EXT: No edema Neuro: Alert and oriented x3. No focalization  Assessment & Plan:

## 2013-08-03 NOTE — Telephone Encounter (Signed)
Hello Jazmin/Jessica, patient was seen for chronic cough, I recommended stopping Lisinopril because of this and started him on Chlorthalidone instead.

## 2013-08-03 NOTE — Patient Instructions (Signed)
We have changed you blood pressure medication to Losartan, please take it as prescribed and keep a log of your blood pressure. Schedule and appointment with your primary doctor in 2 -3 weeks or sooner if needed.

## 2013-08-05 ENCOUNTER — Ambulatory Visit (HOSPITAL_COMMUNITY)
Admission: RE | Admit: 2013-08-05 | Discharge: 2013-08-05 | Disposition: A | Discharge status: Home / Self Care | Payer: No Typology Code available for payment source | Source: Ambulatory Visit | Attending: Family Medicine | Admitting: Family Medicine

## 2013-08-05 DIAGNOSIS — R0602 Shortness of breath: Secondary | ICD-10-CM

## 2013-08-05 DIAGNOSIS — R053 Chronic cough: Secondary | ICD-10-CM

## 2013-08-05 DIAGNOSIS — I517 Cardiomegaly: Secondary | ICD-10-CM

## 2013-08-05 DIAGNOSIS — R05 Cough: Secondary | ICD-10-CM | POA: Insufficient documentation

## 2013-08-05 DIAGNOSIS — I10 Essential (primary) hypertension: Secondary | ICD-10-CM

## 2013-08-05 DIAGNOSIS — R059 Cough, unspecified: Secondary | ICD-10-CM | POA: Insufficient documentation

## 2013-08-05 NOTE — Progress Notes (Signed)
2D Echocardiogram Complete.  08/05/2013   Zakia Sainato, RDCS

## 2013-08-09 ENCOUNTER — Telehealth: Payer: Self-pay | Admitting: Family Medicine

## 2013-08-09 NOTE — Telephone Encounter (Signed)
I called and discussed his ECHO result with patient, all questions were answered.

## 2013-11-08 ENCOUNTER — Ambulatory Visit (INDEPENDENT_AMBULATORY_CARE_PROVIDER_SITE_OTHER): Payer: No Typology Code available for payment source | Admitting: Family Medicine

## 2013-11-08 ENCOUNTER — Encounter: Payer: Self-pay | Admitting: Family Medicine

## 2013-11-08 VITALS — BP 128/81 | HR 58 | Temp 98.0°F | Wt 157.0 lb

## 2013-11-08 DIAGNOSIS — M545 Low back pain, unspecified: Secondary | ICD-10-CM

## 2013-11-08 DIAGNOSIS — R634 Abnormal weight loss: Secondary | ICD-10-CM

## 2013-11-08 DIAGNOSIS — K6289 Other specified diseases of anus and rectum: Secondary | ICD-10-CM

## 2013-11-08 LAB — CBC
HEMATOCRIT: 40.7 % (ref 39.0–52.0)
HEMOGLOBIN: 13.7 g/dL (ref 13.0–17.0)
MCH: 27.6 pg (ref 26.0–34.0)
MCHC: 33.7 g/dL (ref 30.0–36.0)
MCV: 82.1 fL (ref 78.0–100.0)
Platelets: 274 10*3/uL (ref 150–400)
RBC: 4.96 MIL/uL (ref 4.22–5.81)
RDW: 15.3 % (ref 11.5–15.5)
WBC: 4.6 10*3/uL (ref 4.0–10.5)

## 2013-11-08 LAB — BASIC METABOLIC PANEL
BUN: 13 mg/dL (ref 6–23)
CHLORIDE: 103 meq/L (ref 96–112)
CO2: 25 meq/L (ref 19–32)
Calcium: 9.4 mg/dL (ref 8.4–10.5)
Creat: 1.05 mg/dL (ref 0.50–1.35)
GLUCOSE: 93 mg/dL (ref 70–99)
POTASSIUM: 4.1 meq/L (ref 3.5–5.3)
SODIUM: 137 meq/L (ref 135–145)

## 2013-11-08 NOTE — Patient Instructions (Signed)
It was great seeing you today.   1. I will check some blood work today and call you if anything is abnormal. I want you to follow-up with Dr Erin Hearing in 1 month for your back/rectal pain.  2. Likely your back pain is due to Muscle strain. Below are some stretches and exercises your should do to help with your pain.    Please bring all your medications to every doctors visit  Sign up for My Chart to have easy access to your labs results, and communication with your Primary care physician.  Next Appointment  Please call to make an appointment with Dr Erin Hearing in 1 month   If you have any questions or concerns before then, please call the clinic at [phone].  Take Care,   Dr Phill Myron  Low Back Strain with Rehab   A strain is an injury in which a tendon or muscle is torn. The muscles and tendons of the lower back are vulnerable to strains. However, these muscles and tendons are very strong and require a great force to be injured.    SYMPTOMS  Pain in the lower back.  Pain that affects one side more than the other.  Pain that gets worse with movement and may be felt in the hip, buttocks, or back of the thigh.  Muscle spasms of the muscles in the back.  Swelling along the muscles of the back.  Loss of strength of the back muscles.  Crackling sound (crepitation) when the muscles are touched.   CAUSES   Lower back strains occur when a force is placed on the muscles or tendons that is greater than they can handle. Common causes of injury include:  Prolonged overuse of the muscle-tendon units in the lower back, usually from incorrect posture.  A single violent injury or force applied to the back.   RISK INCREASES WITH    Sports that involve twisting forces on the spine or a lot of bending at the waist (football, rugby, weightlifting, bowling, golf, tennis, speed skating, racquetball, swimming, running, gymnastics, diving).  Poor strength and flexibility.   Failure to warm up properly before activity.  Family history of lower back pain or disk disorders.  Previous back injury or surgery (especially fusion).  Poor posture with lifting, especially heavy objects.  Prolonged sitting, especially with poor posture.   PREVENTIVE MEASURES    Learn and use proper posture when sitting or lifting (maintain proper posture when sitting, lift using the knees and legs, not at the waist).  Warm up and stretch properly before activity.  Allow for adequate recovery between workouts.  Maintain physical fitness: l Strength, flexibility, and endurance. l Cardiovascular fitness.   PROGNOSIS If treated properly, lower back strains usually heal within 6 weeks.   POSSIBLE COMPLICATIONS    Recurring symptoms, resulting in a chronic problem.  Chronic inflammation, scarring, and partial muscle-tendon tear.  Delayed healing or resolution of symptoms.   GENERAL TREATMENT CONSIDERATIONS   Treatment first involves the use of ice and medicine, to reduce pain and inflammation. The use of strengthening and stretching exercises will help reduce pain with activity. These exercises may be performed at home or with a therapist. Severe injuries may require referral to a therapist for further evaluation and treatment, such as ultrasound. Your caregiver may advise that you wear a back brace or corset, to help reduce pain and discomfort. Often, prolonged bed rest results in greater harm then benefit. Corticosteroid injections may be recommended. However, these should  be reserved for the most serious cases. It is important to avoid using your back when lifting objects. At night, sleep on your back on a firm mattress with a pillow placed under your knees. If non-surgical treatment is unsuccessful, surgery may be needed.     MEDICATION:    If pain medicine is needed, nonsteroidal anti-inflammatory medicines (aspirin and ibuprofen), or other minor pain relievers  (acetaminophen), are often advised.    Do not take pain medicine for 7 days before surgery.    Prescription pain relievers may be given, if your caregiver thinks they are needed. Use only as directed and only as much as you need.  Ointments applied to the skin may be helpful.  Corticosteroid injections may be given by your caregiver. These injections should be reserved for the most serious cases, because they may only be given a certain number of times.   HEAT AND COLD:    Cold treatment (icing) should be applied for 10 to 15 minutes every 2 to 3 hours for inflammation and pain, and immediately after activity that aggravates your symptoms. Use ice packs or an ice massage.  Heat treatment may be used before performing stretching and strengthening activities prescribed by your caregiver, physical therapist, or athletic trainer. Use a heat pack or a warm water soak.     SEEK MEDICAL CARE IF:    Symptoms get worse or do not improve in 2 to 4 weeks, despite treatment.  You develop numbness, weakness, or loss of bowel or bladder function.  New, unexplained symptoms develop. (Drugs used in treatment may produce side effects.)     EXERCISES   RANGE OF MOTION AND STRETCHING EXERCISES - Low Back Strain Most people with lower back pain will find that their symptoms get worse with excessive bending forward (flexion) or arching at the lower back (extension). The exercises which will help resolve your symptoms will focus on the opposite motion.     Your physician, physical therapist or athletic trainer will help you determine which exercises will be most helpful to resolve your lower back pain. Do not complete any exercises without first consulting with your caregiver. Discontinue any exercises which make your symptoms worse until you speak to your caregiver.    If you have pain, numbness or tingling which travels down into your buttocks, leg or foot, the goal of the therapy is for these symptoms  to move closer to your back and eventually resolve. Sometimes, these leg symptoms will get better, but your lower back pain may worsen. This is typically an indication of progress in your rehabilitation. Be very alert to any changes in your symptoms and the activities in which you participated in the 24 hours prior to the change. Sharing this information with your caregiver will allow him/her to most efficiently treat your condition.    These exercises may help you when beginning to rehabilitate your injury. Your symptoms may resolve with or without further involvement from your physician, physical therapist or athletic trainer. While completing these exercises, remember:    Restoring tissue flexibility helps normal motion to return to the joints. This allows healthier, less painful movement and activity.  An effective stretch should be held for at least 30 seconds.  A stretch should never be painful. You should only feel a gentle lengthening or release in the stretched tissue.       FLEXION RANGE OF MOTION AND STRETCHING EXERCISES:   STRETCH - Flexion, Single Knee to Chest   Lie on  a firm bed or floor with both legs extended in front of you.  Keeping one leg in contact with the floor, bring your opposite knee to your chest. Hold your leg in place by either grabbing behind your thigh or at your knee.  Pull until you feel a gentle stretch in your lower back. Hold ___15___ seconds.  Slowly release your grasp and repeat the exercise with the opposite side. Repeat ___3____ times. Complete this exercise _____1_____ times per day.      STRETCH - Flexion, Double Knee to Chest   Lie on a firm bed or floor with both legs extended in front of you.  Keeping one leg in contact with the floor, bring your opposite knee to your chest.    Tense your stomach muscles to support your back and then lift your other knee to your chest. Hold your legs in place by either grabbing behind your thighs or at  your knees.  Pull both knees toward your chest until you feel a gentle stretch in your lower back. Hold ___15___ seconds.  Tense your stomach muscles and slowly return one leg at a time to the floor. Repeat ____3_ times. Complete this exercise _1____ times per day.      STRETCH - Low Trunk Rotation    Lie on a firm bed or floor. Keeping your legs in front of you, bend your knees so they are both pointed toward the ceiling and your feet are flat on the floor.  Extend your arms out to the side. This will stabilize your upper body by keeping your shoulders in contact with the floor.  Gently and slowly drop both knees together to one side until you feel a gentle stretch in your lower back. Hold for __15_ seconds.    Tense your stomach muscles to support your lower back as you bring your knees back to the starting position. Repeat the exercise to the other side. Repeat __3__ times. Complete this exercise ___1__ times per day       EXTENSION RANGE OF MOTION AND FLEXIBILITY EXERCISES:  STRETCH - Extension, Prone on Elbows   Lie on your stomach on the floor, a bed will be too soft. Place your palms about shoulder width apart and at the height of your head.  Place your elbows under your shoulders. If this is too painful, stack pillows under your chest.  Allow your body to relax so that your hips drop lower and make contact more completely with the floor.  Hold this position for _15___ seconds.  Slowly return to lying flat on the floor. Repeat __3___ times. Complete this exercise ___1___ times per day.      RANGE OF MOTION - Extension, Prone Press Ups    Lie on your stomach on the floor, a bed will be too soft. Place your palms about shoulder width apart and at the height of your head.  Keeping your back as relaxed as possible, slowly straighten your elbows while keeping your hips on the floor. You may adjust the placement of your hands to maximize your comfort. As you gain motion, your  hands will come more underneath your shoulders.  Hold this position ___15__ seconds.  Slowly return to lying flat on the floor. Repeat __3____ times. Complete this exercise __1____ times per day.       RANGE OF MOTION- Quadruped, Neutral Spine   Assume a hands and knees position on a firm surface. Keep your hands under your shoulders and your knees under  your hips. You may place padding under your knees for comfort.    Drop your head and point your tail bone toward the ground below you.  This will round out your lower back like an angry cat. Hold this position for __15____ seconds.    Slowly lift your head and release your tail bone so that your back sags into a large arch, like an old horse.  Hold this position for __15____ seconds.    Repeat this until you feel limber in your lower back.  Now, find your "sweet spot." This will be the most comfortable position somewhere between the two previous positions. This is your neutral spine. Once you have found this position, tense your stomach muscles to support your lower back.  Hold this position for __15____ seconds. Repeat _3____ times. Complete this exercise __1____ times per day.       STRENGTHENING EXERCISES - Low Back Strain These exercises may help you when beginning to rehabilitate your injury. These exercises should be done near your "sweet spot." This is the neutral, low-back arch, somewhere between fully rounded and fully arched, that is your least painful position. When performed in this safe range of motion, these exercises can be used for people who have either a flexion or extension based injury. These exercises may resolve your symptoms with or without further involvement from your physician, physical therapist or athletic trainer. While completing these exercises, remember:    Muscles can gain both the endurance and the strength needed for everyday activities through controlled exercises.  Complete these exercises as  instructed by your physician, physical therapist or athletic trainer. Increase the resistance and repetitions only as guided.  You may experience muscle soreness or fatigue, but the pain or discomfort you are trying to eliminate should never worsen during these exercises. If this pain does worsen, stop and make certain you are following the directions exactly. If the pain is still present after adjustments, discontinue the exercise until you can discuss the trouble with your caregiver.     STRENGTHENING - Deep Abdominals, Pelvic Tilt    Lie on a firm bed or floor. Keeping your legs in front of you, bend your knees so they are both pointed toward the ceiling and your feet are flat on the floor.  Tense your lower abdominal muscles to press your lower back into the floor. This motion will rotate your pelvis so that your tail bone is scooping upwards rather than pointing at your feet or into the floor.  With a gentle tension and even breathing, hold this position for ____10__ seconds. Repeat __10____ times. Complete this exercise ____1___ times per day.      STRENGTHENING - Abdominals, Crunches   Lie on a firm bed or floor. Keeping your legs in front of you, bend your knees so they are both pointed toward the ceiling and your feet are flat on the floor. Cross your arms over your chest.    Slightly tip your chin down without bending your neck.  Tense your abdominals and slowly lift your trunk high enough to just clear your shoulder blades. Lifting higher can put excessive stress on the lower back and does not further strengthen your abdominal muscles.  Control your return to the starting position. Repeat __10___ times. Complete this exercise ____1___ times per day.    STRENGTHENING - Quadruped, Opposite UE/LE Lift   Assume a hands and knees position on a firm surface. Keep your hands under your shoulders and your knees under  your hips. You may place padding under your knees for comfort.     Find your neutral spine and gently tense your abdominal muscles so that you can maintain this position. Your shoulders and hips should form a rectangle that is parallel with the floor and is not twisted.    Keeping your trunk steady, lift your right hand no higher than your shoulder and then your left leg no higher than your hip. Make sure you are not holding your breath. Hold this position ___10___ seconds.  Continuing to keep your abdominal muscles tense and your back steady, slowly return to your starting position. Repeat with the opposite arm and leg. Repeat _10____ times. Complete this exercise __1___ times per day.      STRENGTHENING - Lower Abdominals, Double Knee Lift  Lie on a firm bed or floor. Keeping your legs in front of you, bend your knees so they are both pointed toward the ceiling and your feet are flat on the floor.    Tense your abdominal muscles to brace your lower back and slowly lift both of your knees until they come over your hips. Be certain not to hold your breath.    Hold _10__ seconds. Using your abdominal muscles, return to the starting position in a slow and controlled manner.   Repeat __1____ times. Complete this exercise __1___ times per day.       POSTURE AND BODY MECHANICS CONSIDERATIONS - Low Back Strain Keeping correct posture when sitting, standing or completing your activities will reduce the stress put on different body tissues, allowing injured tissues a chance to heal and limiting painful experiences. The following are general guidelines for improved posture. Your physician or physical therapist will provide you with any instructions specific to your needs. While reading these guidelines, remember:  The exercises prescribed by your provider will help you have the flexibility and strength to maintain correct postures.  The correct posture provides the best environment for your joints to work. All of your joints have less wear and tear when properly  supported by a spine with good posture. This means you will experience a healthier, less painful body.  Correct posture must be practiced with all of your activities, especially prolonged sitting and standing.  Correct posture is as important when doing repetitive low-stress activities (typing) as it is when doing a single heavy-load activity (lifting).       RESTING POSITIONS Consider which positions are most painful for you when choosing a resting position.  If you have pain with flexion-based activities (sitting, bending, stooping, squatting), choose a position that allows you to rest in a less flexed posture. You would want to avoid curling into a fetal position on your side. If your pain worsens with extension-based activities (prolonged standing, working overhead), avoid resting in an extended position such as sleeping on your stomach. Most people will find more comfort when they rest with their spine in a more neutral position, neither too rounded nor too arched.  Lying on a non-sagging bed on your side with a pillow between your knees, or on your back with a pillow under your knees will often provide some relief. Keep in mind, being in any one position for a prolonged period of time, no matter how correct your posture, can still lead to stiffness.     PROPER SITTING POSTURE In order to minimize stress and discomfort on your spine, you must sit with correct posture. Sitting with good posture should be effortless for a healthy  body. Returning to good posture is a gradual process. Many people can work toward this most comfortably by using various supports until they have the flexibility and strength to maintain this posture on their own.   When sitting with proper posture, your ears will fall over your shoulders and your shoulders will fall over your hips. You should use the back of the chair to support your upper back. Your lower back will be in a neutral position, just slightly arched. You may  place a small pillow or folded towel at the base of your lower back for   support.     When working at a desk, create an environment that supports good, upright posture. Without extra support, muscles tire, which leads to excessive strain on joints and other tissues.  Keep these recommendations in mind:   CHAIR:    A chair should be able to slide under your desk when your back makes contact with the back of the chair. This allows you to work closely.  The chair's height should allow your eyes to be level with the upper part of your monitor and your hands to be slightly lower than your elbows.  BODY POSITION  Your feet should make contact with the floor. If this is not possible, use a foot rest.  Keep your ears over your shoulders. This will reduce stress on your neck and lower back.     INCORRECT SITTING POSTURES  If you are feeling tired and unable to assume a healthy sitting posture, do not slouch or slump. This puts excessive strain on your back tissues, causing more damage and pain. Healthier options include:  Using more support, like a lumbar pillow.  Switching tasks to something that requires you to be upright or walking.  Talking a brief walk.  Lying down to rest in a neutral-spine position.      PROLONGED STANDING WHILE SLIGHTLY LEANING FORWARD  When completing a task that requires you to lean forward while standing in one place for a long time, place either foot up on a stationary 2-4 inch high object to help maintain the best posture. When both feet are on the ground, the lower back tends to lose its slight inward curve. If this curve flattens (or becomes too large), then the back and your other joints will experience too much stress, tire more quickly, and can cause pain.     CORRECT STANDING POSTURES Proper standing posture should be assumed with all daily activities, even if they only take a few moments, like when brushing your teeth. As in sitting, your ears should  fall over your shoulders and your shoulders should fall over your hips. You should keep a slight tension in your abdominal muscles to brace your spine. Your tailbone should point down to the ground, not behind your body, resulting in an over-extended swayback posture.       INCORRECT STANDING POSTURES  Common incorrect standing postures include a forward head, locked knees and/or an excessive swayback.     WALKING Walk with an upright posture. Your ears, shoulders and hips should all line-up.     PROLONGED ACTIVITY IN A FLEXED POSITION When completing a task that requires you to bend forward at your waist or lean over a low surface, try to find a way to stabilize 3 out of 4 of your limbs. You can place a hand or elbow on your thigh or rest a knee on the surface you are reaching across. This will provide you  more stability so that your muscles do not fatigue as quickly. By keeping your knees relaxed, or slightly bent, you will also reduce stress across your lower back.     CORRECT LIFTING TECHNIQUES DO :     Assume a wide stance. This will provide you more stability and the opportunity to get as close as possible to the object which you are lifting.  Tense your abdominals to brace your spine. Bend at the knees and hips. Keeping your back locked in a neutral-spine position, lift using your leg muscles. Lift with your legs, keeping your back straight.  Test the weight of unknown objects before attempting to lift them.  Try to keep your elbows locked down at your sides in order get the best strength from your shoulders when carrying an object.  Always ask for help when lifting heavy or awkward objects.    INCORRECT LIFTING TECHNIQUES DO NOT:   Lock your knees when lifting, even if it is a small object.  Bend and twist.  Pivot at your feet or move your feet when needing to change directions.  Assume that you can safely pick up even a paperclip without proper posture.   Document  Released: 03/25/2005  Document Re-Released: 01/20/2009 [employer] Patient Information 2011 [place]

## 2013-11-08 NOTE — Progress Notes (Signed)
Subjective:     Patient ID: [name], male   DOB: [birth year], 53 y.o.   MRN: [id number]  HPI Comments: BACK PAIN Quality: [name]  Location & Radiation: Midline Lumbar without radiation  Timing (Onset, Duration, Freq): Constant for the past couple of months, but worsening in the last 15 days Worse with: Sitting  Better with: Nothing  Any Trauma: Denies  Red Flags Urinary retention: no  Numbness/Weakness: no  Fever/chills/sweats: no  Night pain: no  Unexplained weight loss: no, intentional weight loss of approximately 20 pounds in last 8 months due to 2 exercising: Has been running several miles a day, as well as boxing since told he was prediabetic  No relief with bedrest: no  Hx of cancer/immunosuppression: no  Hx of IV drug use: no  Hx of osteoporosis or chronic steroid use: no  Additionally, he complains of chronic rectal pain for approximately 20 years radiates to his right flank, for which he was evaluated in [place].  He is also concerned about hair loss, which has increased over the past month.       Review of Systems See HPI     Objective:   Physical Exam  Constitutional: He appears well-developed and well-nourished.  Cardiovascular: Normal rate and regular rhythm.   Pulmonary/Chest: Effort normal.  Abdominal: Soft. He exhibits no mass. There is no guarding.  Genitourinary: Rectum normal and prostate normal. Guaiac negative stool.  Musculoskeletal:  Back: Normal skin w/o rash, Spine with normal alignment and no deformity. No tenderness to vertebral process palpation/percussion.  Paraspinous muscles are non tender and without spasm.  Range of motion is full at neck and lumbar sacral regions.  Bilateral straight leg raise test negative.  Patellar reflexes 2+ bilaterally.  Strength 5 out of 5 for knee extension, flexion; ankle plantar and dorsal flexion.  Lower extremity sensation intact bilaterally    Assessment/Plan:      See Problem Focused Assessment &  Plan

## 2013-11-09 ENCOUNTER — Encounter: Payer: Self-pay | Admitting: Family Medicine

## 2013-11-09 DIAGNOSIS — M549 Dorsalgia, unspecified: Secondary | ICD-10-CM | POA: Insufficient documentation

## 2013-11-09 LAB — PSA: PSA: 1.55 ng/mL

## 2013-11-09 NOTE — Assessment & Plan Note (Signed)
Pertinent S&O  Midline lumbar back pain, without radiation or pain/tenderness with percussion  Chronic rectal pain; Approximately 20 years  Intentional weight loss, 20 pounds in 8 months Assessment   Likely muscle skeletal in etiology given his current exercise regimen Plan  Patient instructed on core stretching and exercise program  Followup with PCP in one month; may need to consider imaging if pain not improving or weight loss continues

## 2013-11-09 NOTE — Assessment & Plan Note (Signed)
The pain seems to be chronic and unchanged in nature - Rectal exam within normal limits; guaiac negative - Up to date on colonoscopy - Doubt this is related to his current back pain; however, may need to reimage this if his back pain.  Does not improve with conservative management given his weight loss which seem to intentional at this time

## 2013-12-09 ENCOUNTER — Ambulatory Visit: Payer: Self-pay

## 2013-12-10 ENCOUNTER — Other Ambulatory Visit: Payer: Self-pay | Admitting: *Deleted

## 2013-12-10 ENCOUNTER — Telehealth: Payer: Self-pay | Admitting: Family Medicine

## 2013-12-10 NOTE — Telephone Encounter (Signed)
Pt called and needs a refill on his Losartan called in. jw

## 2013-12-14 MED ORDER — LOSARTAN POTASSIUM 25 MG PO TABS: 25.0000 mg | ORAL_TABLET | Freq: Every day | ORAL | Status: DC

## 2013-12-14 MED ORDER — LORATADINE 10 MG PO TABS: 10.0000 mg | ORAL_TABLET | Freq: Every day | ORAL | Status: DC

## 2013-12-15 ENCOUNTER — Telehealth: Payer: Self-pay | Admitting: Family Medicine

## 2013-12-15 NOTE — Telephone Encounter (Signed)
Pt called and wanted his Losartan to go to [facility] outpatient pharmacy. jw

## 2013-12-16 ENCOUNTER — Other Ambulatory Visit: Payer: Self-pay | Admitting: *Deleted

## 2013-12-16 MED ORDER — LOSARTAN POTASSIUM 25 MG PO TABS: 25.0000 mg | ORAL_TABLET | Freq: Every day | ORAL | Status: DC

## 2013-12-17 MED ORDER — LOSARTAN POTASSIUM 25 MG PO TABS: 25.0000 mg | ORAL_TABLET | Freq: Every day | ORAL | Status: DC

## 2013-12-20 ENCOUNTER — Telehealth: Payer: Self-pay | Admitting: Family Medicine

## 2013-12-20 ENCOUNTER — Encounter: Payer: Self-pay | Admitting: *Deleted

## 2013-12-20 MED ORDER — LOSARTAN POTASSIUM 25 MG PO TABS: 25.0000 mg | ORAL_TABLET | Freq: Every day | ORAL | Status: DC

## 2013-12-20 NOTE — Telephone Encounter (Signed)
Pt called and said that his BP medication is supposed to be at [facility]. Can we make sure that this is there. jw

## 2013-12-20 NOTE — Telephone Encounter (Signed)
Seems that the original was printed, not sent electronically.  I have sent medication electronically.  Attempted to call pt, no answer and no machine. Karisa Nesser, Salome Spotted

## 2013-12-21 NOTE — Telephone Encounter (Signed)
This encounter was created in error - please disregard.

## 2014-01-06 ENCOUNTER — Encounter: Payer: Self-pay | Admitting: Gastroenterology

## 2014-05-03 ENCOUNTER — Other Ambulatory Visit: Payer: Self-pay | Admitting: Family Medicine

## 2014-05-05 NOTE — Telephone Encounter (Signed)
Pt called and needs a refill on his BP medication. Please send this to [place]. jw

## 2014-05-05 NOTE — Telephone Encounter (Signed)
PCP is out of office.  Will forward to Dr. Andria Frames for refill. Jazmin Hartsell,CMA

## 2014-06-20 ENCOUNTER — Ambulatory Visit: Payer: Self-pay

## 2014-08-17 ENCOUNTER — Ambulatory Visit (INDEPENDENT_AMBULATORY_CARE_PROVIDER_SITE_OTHER): Payer: Self-pay | Admitting: Family Medicine

## 2014-08-17 ENCOUNTER — Encounter: Payer: Self-pay | Admitting: Family Medicine

## 2014-08-17 VITALS — BP 128/71 | HR 85 | Temp 98.4°F | Ht 66.0 in | Wt 165.0 lb

## 2014-08-17 DIAGNOSIS — G629 Polyneuropathy, unspecified: Secondary | ICD-10-CM

## 2014-08-17 DIAGNOSIS — I10 Essential (primary) hypertension: Secondary | ICD-10-CM

## 2014-08-17 NOTE — Progress Notes (Signed)
   Subjective:    Patient ID: [name], male    DOB: 27-Jan-1961, 54 y.o.   MRN: [id number]  HPI  HYPERTENSION Disease Monitoring Home BP Monitoring not checking Chest pain- no    Dyspnea- no Medications Compliance-  Daily losartan. Lightheadedness-  no  Edema- no ROS - See HPI  PMH Lab Review   POTASSIUM  Date Value Ref Range Status  11/08/2013 4.1 3.5 - 5.3 mEq/L Final   SODIUM  Date Value Ref Range Status  11/08/2013 137 135 - 145 mEq/L Final   CREAT  Date Value Ref Range Status  11/08/2013 1.05 0.50 - 1.35 mg/dL Final   CREATININE, SER  Date Value Ref Range Status  06/20/2010 0.93 0.40-1.50 mg/dL Final       Foot Pain Has had for years - worse at night after exercise.  Feels tingling.  No redness or soft tissue swelling or sores Has been checked for diabetes.  Took gabapentin long ago did not help much  Chief Complaint noted Review of Symptoms - see HPI PMH - Smoking status noted.   Vital Signs reviewed     Review of Systems     Objective:   Physical Exam Heart - Regular rate and rhythm.  No murmurs, gallops or rubs.    Lungs:  Normal respiratory effort, chest expands symmetrically. Lungs are clear to auscultation, no crackles or wheezes. Extremities:  No cyanosis, edema, or deformity noted with good range of motion of all major joints.   Diabetic Foot Check -  Appearance - no lesions, ulcers or calluses Skin - no unusual pallor or redness Monofilament testing -  Right - Great toe, medial, central, lateral ball and posterior foot intact Left - Great toe, medial, central, lateral ball and posterior foot intact        Assessment & Plan:

## 2014-08-17 NOTE — Patient Instructions (Signed)
Good to see you today!  Thanks for coming in.  Your blood pressure goal is < 140/90    Come back in 6 month for a blood pressure check and blood check  Try vitamin B6 one tablet every day for 2 months to see if your foot pain improves.  It it is spreading especially if it bothers your hands then come back

## 2014-08-17 NOTE — Assessment & Plan Note (Signed)
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  Trial of Vit B6

## 2014-08-17 NOTE — Assessment & Plan Note (Signed)
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  Trial of Vit B6

## 2014-08-17 NOTE — Assessment & Plan Note (Signed)
Well controlled 

## 2014-11-11 ENCOUNTER — Other Ambulatory Visit: Payer: Self-pay | Admitting: Family Medicine

## 2015-04-12 ENCOUNTER — Ambulatory Visit (INDEPENDENT_AMBULATORY_CARE_PROVIDER_SITE_OTHER): Payer: BLUE CROSS/BLUE SHIELD | Admitting: Family Medicine

## 2015-04-12 ENCOUNTER — Encounter: Payer: Self-pay | Admitting: Family Medicine

## 2015-04-12 VITALS — BP 136/78 | HR 69 | Temp 98.1°F | Ht 66.0 in | Wt 174.0 lb

## 2015-04-12 DIAGNOSIS — Z23 Encounter for immunization: Secondary | ICD-10-CM | POA: Diagnosis not present

## 2015-04-12 DIAGNOSIS — M79672 Pain in left foot: Secondary | ICD-10-CM

## 2015-04-12 DIAGNOSIS — Z1159 Encounter for screening for other viral diseases: Secondary | ICD-10-CM

## 2015-04-12 DIAGNOSIS — Z114 Encounter for screening for human immunodeficiency virus [HIV]: Secondary | ICD-10-CM | POA: Diagnosis not present

## 2015-04-12 DIAGNOSIS — M79662 Pain in left lower leg: Secondary | ICD-10-CM

## 2015-04-12 DIAGNOSIS — M79671 Pain in right foot: Secondary | ICD-10-CM | POA: Insufficient documentation

## 2015-04-12 DIAGNOSIS — M79661 Pain in right lower leg: Secondary | ICD-10-CM | POA: Diagnosis not present

## 2015-04-12 LAB — COMPREHENSIVE METABOLIC PANEL
ALK PHOS: 45 U/L (ref 40–115)
ALT: 30 U/L (ref 9–46)
AST: 24 U/L (ref 10–35)
Albumin: 4.3 g/dL (ref 3.6–5.1)
BUN: 13 mg/dL (ref 7–25)
CALCIUM: 9.4 mg/dL (ref 8.6–10.3)
CO2: 22 mmol/L (ref 20–31)
Chloride: 106 mmol/L (ref 98–110)
Creat: 1.07 mg/dL (ref 0.70–1.33)
GLUCOSE: 113 mg/dL — AB (ref 65–99)
POTASSIUM: 3.9 mmol/L (ref 3.5–5.3)
Sodium: 139 mmol/L (ref 135–146)
Total Bilirubin: 0.3 mg/dL (ref 0.2–1.2)
Total Protein: 6.8 g/dL (ref 6.1–8.1)

## 2015-04-12 LAB — CK: Total CK: 147 U/L (ref 7–232)

## 2015-04-12 LAB — TSH: TSH: 1.077 u[IU]/mL (ref 0.350–4.500)

## 2015-04-12 MED FILL — LOSARTAN POTASSIUM 25 MG TA: 25 | 30 days supply | Qty: 30 | Fill #5

## 2015-04-12 NOTE — Patient Instructions (Signed)
Good to see you today!  Thanks for coming in.  I will call you if your lab tests are not normal.  Otherwise we will discuss them at your next visit.  Come back in 2 weeks and we will discuss your labs and talk about possible treatments  Continue to slowly increase your exercise

## 2015-04-13 LAB — HIV ANTIBODY (ROUTINE TESTING W REFLEX): HIV 1&2 Ab, 4th Generation: NONREACTIVE

## 2015-04-13 LAB — HEPATITIS C ANTIBODY: HCV Ab: NEGATIVE

## 2015-04-13 NOTE — Assessment & Plan Note (Signed)
New onset vs recurrence of neuropathy.  His symptoms are quite vague and hard to quantify.  Will check labs for muscle disease and systemic causes. May try trial of gabapentin

## 2015-04-13 NOTE — Progress Notes (Signed)
   Subjective:    Patient ID: [name], male    DOB: 03-15-1961, 55 y.o.   MRN: [id number]  HPI  Lower Extremity Pain Patient's cc was R foot pain but as talked more he eventually described bilateral lower extremity pain that has been on and off for months.  Pain bothers him at night but also during the day.   Keeps him from exercising as much as he would like.  Has not tried otc medications.  Does not have joint swelling or skin redness or fever or weight loss or upper extremity pain but does have many vague GI complaints No change in medications  PMH  Neuropathy as a clinic diagnosis in past Does not smoke  Chief Complaint noted Review of Symptoms - see HPI PMH - Smoking status noted.   Vital Signs reviewed    Review of Systems     Objective:   Physical Exam  Alert anxious Able to walk on heels toes deep knee bend without much discomfort FROM in all lower extremity joints - no effusions or redness Muscles seem mildly tender A few dark macules on soles of feet other wise skin is normal     Assessment & Plan:

## 2015-04-26 ENCOUNTER — Encounter: Payer: Self-pay | Admitting: Family Medicine

## 2015-04-26 ENCOUNTER — Ambulatory Visit (INDEPENDENT_AMBULATORY_CARE_PROVIDER_SITE_OTHER): Payer: BLUE CROSS/BLUE SHIELD | Admitting: Family Medicine

## 2015-04-26 VITALS — BP 118/64 | HR 87 | Temp 98.4°F | Ht 66.0 in | Wt 173.0 lb

## 2015-04-26 DIAGNOSIS — G629 Polyneuropathy, unspecified: Secondary | ICD-10-CM

## 2015-04-26 DIAGNOSIS — R51 Headache: Secondary | ICD-10-CM

## 2015-04-26 DIAGNOSIS — K6289 Other specified diseases of anus and rectum: Secondary | ICD-10-CM | POA: Diagnosis not present

## 2015-04-26 DIAGNOSIS — R369 Urethral discharge, unspecified: Secondary | ICD-10-CM | POA: Insufficient documentation

## 2015-04-26 DIAGNOSIS — M79662 Pain in left lower leg: Secondary | ICD-10-CM

## 2015-04-26 DIAGNOSIS — M79661 Pain in right lower leg: Secondary | ICD-10-CM

## 2015-04-26 DIAGNOSIS — R519 Headache, unspecified: Secondary | ICD-10-CM | POA: Insufficient documentation

## 2015-04-26 DIAGNOSIS — G4485 Primary stabbing headache: Secondary | ICD-10-CM

## 2015-04-26 LAB — POCT URINALYSIS DIPSTICK
Bilirubin, UA: NEGATIVE
Glucose, UA: NEGATIVE
Ketones, UA: NEGATIVE
Leukocytes, UA: NEGATIVE
Nitrite, UA: NEGATIVE
PH UA: 6
PROTEIN UA: NEGATIVE
RBC UA: NEGATIVE
SPEC GRAV UA: 1.02
UROBILINOGEN UA: 0.2

## 2015-04-26 MED ORDER — GABAPENTIN 100 MG PO CAPS: 100.0000 mg | ORAL_CAPSULE | Freq: Every evening | ORAL | Status: DC | PRN

## 2015-04-26 MED FILL — GABAPENTIN 100 MG CAPSULE: 100 | 10 days supply | Qty: 30 | Fill #0

## 2015-04-26 NOTE — Patient Instructions (Signed)
Good to see you today!  Thanks for coming in.  I will call you if your tests are not good.  Otherwise I will send you a letter.  If you do not hear from me with in 2 weeks please call our office.     Try the gapabentin up to 3 caps at night as needed  Regular exercise will help with the neuropathy  If you have weight loss, or if you have focal weakness in one leg or arm or seeing then come back  Come back for a full physical in one year

## 2015-04-26 NOTE — Assessment & Plan Note (Signed)
Chronic - most consistent with mild nerve stimulation type headache (trigeminal neuralgia)   No signs of CNS disease will follow

## 2015-04-26 NOTE — Assessment & Plan Note (Signed)
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  Try as needed gabapentin at night when it seem to bother him the most

## 2015-04-26 NOTE — Progress Notes (Signed)
   Subjective:    Patient ID: [name], male    DOB: 20-May-1960, 55 y.o.   MRN: [id number]  HPI  Leg Pain - for years on and off -  bilateral usually in the evening when trying to go to sleep.  Able to exercise well and does not seem to have much pain then.  No focal area of pain or any skin changes or soft tissue swelling or redness or joint swelling.   No restless legs type symptoms at night   Headache - for years a brief sharp shooting pain at various locations.  No trying an medications.  No change in vision or weakness or nausea vomiting or forgetfulness  Rectal Pain - also for years - worse at night.  No bleeding or masses felt.  Had work up in past see problem list for details.    Penis Pain - tingling burning intermittently with sometimes a clear discharge from his foreskin.  No discharge from meatus or redness or lesions or hematuria or change in sex function  Chief Complaint noted Review of Symptoms - see HPI PMH - Smoking status noted.  See Problem List for details of prior work ups Vital Signs reviewed   Review of Systems     Objective:   Physical Exam  Neurologic exam : Cn 2-7 intact Strength equal & normal in upper & lower extremities Able to walk on heels and toes.   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  No discharge noted but that is where he reports seeing it on occaision.  No other lesion. Normal testes exam no hernia       Assessment & Plan:

## 2015-04-26 NOTE — Assessment & Plan Note (Signed)
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  No signs of cancer.  Discussed possibility of a circumcision - he is not very interested

## 2015-04-26 NOTE — Assessment & Plan Note (Signed)
Chronic - no signs of mass or cancer or lesions.  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

## 2015-04-27 ENCOUNTER — Encounter: Payer: Self-pay | Admitting: Family Medicine

## 2015-04-27 LAB — PSA: PSA: 1.64 ng/mL (ref <=4.00)

## 2015-05-15 MED FILL — LOSARTAN POTASSIUM 25 MG TA: 25 | 30 days supply | Qty: 30 | Fill #6

## 2015-06-09 ENCOUNTER — Encounter: Payer: Self-pay | Admitting: Family Medicine

## 2015-06-09 ENCOUNTER — Ambulatory Visit (INDEPENDENT_AMBULATORY_CARE_PROVIDER_SITE_OTHER): Payer: BLUE CROSS/BLUE SHIELD | Admitting: Family Medicine

## 2015-06-09 VITALS — BP 137/80 | HR 74 | Temp 98.2°F | Wt 175.0 lb

## 2015-06-09 DIAGNOSIS — R1084 Generalized abdominal pain: Secondary | ICD-10-CM

## 2015-06-09 LAB — POCT GLYCOSYLATED HEMOGLOBIN (HGB A1C): HEMOGLOBIN A1C: 5.8

## 2015-06-09 LAB — POCT URINALYSIS DIPSTICK
BILIRUBIN UA: NEGATIVE
Blood, UA: NEGATIVE
Glucose, UA: NEGATIVE
Ketones, UA: NEGATIVE
Leukocytes, UA: NEGATIVE
Nitrite, UA: NEGATIVE
PH UA: 5.5
PROTEIN UA: NEGATIVE
SPEC GRAV UA: 1.02
Urobilinogen, UA: 0.2

## 2015-06-09 LAB — BASIC METABOLIC PANEL WITH GFR
BUN: 12 mg/dL (ref 7–25)
CO2: 28 mmol/L (ref 20–31)
CREATININE: 1.03 mg/dL (ref 0.70–1.33)
Calcium: 9.3 mg/dL (ref 8.6–10.3)
Chloride: 104 mmol/L (ref 98–110)
GFR, Est Non African American: 81 mL/min (ref >=60)
GLUCOSE: 88 mg/dL (ref 65–99)
POTASSIUM: 3.9 mmol/L (ref 3.5–5.3)
Sodium: 139 mmol/L (ref 135–146)

## 2015-06-09 NOTE — Patient Instructions (Signed)
I have order some labs today to check your abdominal pain. I will send you a letter with the results, or call you if we need to make any changes to your current therapies.  Abdominal pain could be due to your decreased eating or Hydroxycut.   - If your pain continues or worsens after 1-2 weeks then return to see Dr Erin Hearing for re-evaluation.  - Recommend eating 3 healthy pills daily as opposed to 1 meal.  - I recommend not taking Hydroxycut

## 2015-06-09 NOTE — Progress Notes (Signed)
   Subjective:    Patient ID: [name], male    DOB: Dec 04, 1960, 55 y.o.   MRN: [id number]  Seen for Same day visit for   CC: abdominal pain  ABDOMINAL PAIN - he reports generalized abdominal pain and crampiness has been constant, slowly improving over the past 4 days.  Also associated with worsening of his lower back and upper back pain.  He denies any injuries or trauma, does report driving a lot over the last week.  Food sometimes makes the pain worse.  But he is taking nothing for the pain.  Nothing makes the pain better.  He has been eating less only 1 meal a day in order to lose weight.  He has also been taking Hydroxycut for the past several weeks.  He reports difficulty exercising due to bilateral lower leg pain.   Pain began 4 days ago - and is improving Medications tried: none   Similar pain before:no Prior abdominal surgeries: no  Symptoms Nausea/vomiting: no Diarrhea: no Constipation: no Blood in stool: no Blood in vomit: no Fever: no Dysuria: no Loss of appetite: no Weight loss: no  Review of Symptoms - see HPI PMH - Smoking status noted.    Objective:  BP 137/80 mmHg  Pulse 74  Temp(Src) 98.2 F (36.8 C) (Oral)  Wt 175 lb (79.379 kg)  General: NAD Cardiac: RRR, normal heart sounds, no murmurs. 2+ radial and PT pulses bilaterally Respiratory: CTAB, normal effort Abdomen: soft, nontender, nondistended, no hepatic or splenomegaly. Bowel sounds present; No CVA tenderness Back : mild tenderness to cervical and lumbar paraspinal area Extremities: no edema or cyanosis. WWP. Skin: warm and dry, no rashes noted    Assessment & Plan:   1. Generalized abdominal pain Generalized diffuse abdominal pain, slowly improving without intervention.  Possibly related to decreased caloric intake only 1 meal a day in addition to taking Hydroxycut attempts to lose weight.  Has had previous endoscopy, colonoscopy, CT scans that have all been negative the past 5-10 years,  except for previous CT showed nonobstructing nephrolithiasis.  - POCT urinalysis dipstick - BASIC METABOLIC PANEL WITH GFR - POCT glycosylated hemoglobin (Hb A1C) - As the vital signs and physical reassuring without concerning red flags will check above labs for worsening blood sugar control or symptomatic kidney stones otherwise, advised observation, discontinued Hydroxycut and eating 3 meals a day.  If pain persist beyond the next 1-2 weeks follow-up with PCP for reassessment

## 2015-06-13 ENCOUNTER — Encounter: Payer: Self-pay | Admitting: Family Medicine

## 2015-06-13 MED FILL — LOSARTAN POTASSIUM 25 MG TA: 25 | 30 days supply | Qty: 30 | Fill #7

## 2015-07-12 ENCOUNTER — Telehealth: Payer: Self-pay | Admitting: Family Medicine

## 2015-07-12 DIAGNOSIS — M79662 Pain in left lower leg: Principal | ICD-10-CM

## 2015-07-12 DIAGNOSIS — M79661 Pain in right lower leg: Secondary | ICD-10-CM

## 2015-07-12 NOTE — Telephone Encounter (Signed)
Will forward to pcp to place referral. [name]

## 2015-07-12 NOTE — Telephone Encounter (Signed)
Need referral to an podiatrist pain to bottom of feet

## 2015-07-14 NOTE — Telephone Encounter (Signed)
I put in the referral for chronic lower leg and foot pain

## 2015-07-17 MED FILL — LOSARTAN POTASSIUM 25 MG TA: 25 | 30 days supply | Qty: 30 | Fill #8

## 2015-07-20 ENCOUNTER — Telehealth: Payer: Self-pay | Admitting: Family Medicine

## 2015-07-20 DIAGNOSIS — M79662 Pain in left lower leg: Principal | ICD-10-CM

## 2015-07-20 DIAGNOSIS — M79661 Pain in right lower leg: Secondary | ICD-10-CM

## 2015-07-20 NOTE — Telephone Encounter (Signed)
Will forward to MD to advise. Jazmin Hartsell,CMA  

## 2015-07-20 NOTE — Telephone Encounter (Signed)
Pt is calling to check the status of his referral to [facility]. They would not see him because they said they do not treat leg pain. He said that the pain is in the bottom of his feet. He would like a new referral stating this so that they will see him. If they still can not see him can we send him to another office . jw

## 2015-07-21 MED FILL — GABAPENTIN 100 MG CAPSULE: 100 | 10 days supply | Qty: 30 | Fill #1

## 2015-07-24 NOTE — Telephone Encounter (Signed)
I reconsulted under Foot Pain  Thanks  [place]

## 2015-08-03 ENCOUNTER — Ambulatory Visit (INDEPENDENT_AMBULATORY_CARE_PROVIDER_SITE_OTHER): Payer: BLUE CROSS/BLUE SHIELD

## 2015-08-03 ENCOUNTER — Ambulatory Visit (INDEPENDENT_AMBULATORY_CARE_PROVIDER_SITE_OTHER): Payer: BLUE CROSS/BLUE SHIELD | Admitting: Podiatry

## 2015-08-03 DIAGNOSIS — M79673 Pain in unspecified foot: Secondary | ICD-10-CM | POA: Diagnosis not present

## 2015-08-03 DIAGNOSIS — M722 Plantar fascial fibromatosis: Secondary | ICD-10-CM | POA: Diagnosis not present

## 2015-08-03 MED ORDER — TRIAMCINOLONE ACETONIDE 10 MG/ML IJ SUSP
10.0000 mg | Freq: Once | INTRAMUSCULAR | Status: AC
  Administered 2015-08-03: 10 mg

## 2015-08-03 MED ORDER — PREDNISONE 10 MG PO TABS: ORAL_TABLET | ORAL | Status: DC

## 2015-08-03 NOTE — Progress Notes (Signed)
   Subjective:    Patient ID: [name], male    DOB: 04-05-1961, 55 y.o.   MRN: [id number]  HPI  The bottom of my feet hurt especially at night I have to take 3 Gabapentin just to sleep feels like it is getting worse.  They have been hurting for 2-3 years my doctor says it is Fibromyalgia     Review of Systems  Musculoskeletal: Positive for myalgias.  Allergic/Immunologic: Positive for environmental allergies.  Neurological: Positive for headaches.  Psychiatric/Behavioral: Positive for sleep disturbance. The patient is nervous/anxious.        Objective:   Physical Exam        Assessment & Plan:

## 2015-08-03 NOTE — Patient Instructions (Signed)

## 2015-08-04 NOTE — Progress Notes (Signed)
Subjective:     Patient ID: RUMEAL UNDERKOFFLER, male   DOB: 1960-04-14, 55 y.o.   MRN: [id number]  HPI patient states she's had a several year history of pain in the bottom of both his feet and pain into his arches. His doctor thought it was some kind of nerve condition and is placed him on gabapentin which doesn't seem like it's getting it better. It's worse at nighttime and after periods of sitting   Review of Systems  All other systems reviewed and are negative.      Objective:   Physical Exam  Constitutional: He is oriented to person, place, and time.  Cardiovascular: Intact distal pulses.   Musculoskeletal: Normal range of motion.  Neurological: He is oriented to person, place, and time.  Skin: Skin is warm.  Nursing note and vitals reviewed.  neurovascular status intact muscle strength adequate range of motion within normal limits with patient found to have discomfort in the plantar aspect of the heels bilateral. They're sore when pressed and make it hard to walk and there is also depression of the arch noted upon palpation. Patient's found to have good digital perfusion and is well oriented 3     Assessment:     Acute plantar fasciitis left and right with inflammation with possibility for neuropathic symptoms but does not have indications neuropathic disease with moderate depression of the arch is complicating factor    Plan:     H&P and x-rays reviewed. Injected the plantar fascia bilateral 3 Milligan Kenalog 5 mg Xylocaine and applied fascial brace. Gave instructions on physical therapy and placed on Sterapred DS 12 day Dosepak and reappoint 2 weeks to recheck

## 2015-08-09 ENCOUNTER — Telehealth: Payer: Self-pay | Admitting: *Deleted

## 2015-08-09 NOTE — Telephone Encounter (Signed)
Pt presents to [facility] office states he is having an allergic reaction to the Prednisone and was told by pharmacist not to just stop the medication.  Pt complains of a rash, stomach upset and frontal headache.  I checked pt's right inner upper arm where pt pointed and saw a darkened area, but no rash to either arms. Pt denied any other symptoms. I told pt I would speak with the doctor and call him, I copied pt's Prednisone 10 mg dosing schedule to show the doctor and counted the remaining pills = 18 pills, pt wrote his phone number. Dr. Paulla Dolly states continue the prescribed schedule for the Prednisone 10mg  and if no improvement stop the medication.

## 2015-08-15 ENCOUNTER — Telehealth: Payer: Self-pay | Admitting: Family Medicine

## 2015-08-15 MED ORDER — LOSARTAN POTASSIUM 25 MG PO TABS: 25.0000 mg | ORAL_TABLET | Freq: Every day | ORAL | Status: DC

## 2015-08-15 MED FILL — LOSARTAN POTASSIUM 25 MG TA: 25 | 90 days supply | Qty: 90 | Fill #0

## 2015-08-15 NOTE — Telephone Encounter (Signed)
Will forward to MD.  

## 2015-08-15 NOTE — Telephone Encounter (Signed)
Pt called and would like a refill on his losartan called in. jw

## 2015-08-17 ENCOUNTER — Encounter: Payer: Self-pay | Admitting: Podiatry

## 2015-08-17 ENCOUNTER — Ambulatory Visit (INDEPENDENT_AMBULATORY_CARE_PROVIDER_SITE_OTHER): Payer: BLUE CROSS/BLUE SHIELD | Admitting: Podiatry

## 2015-08-17 VITALS — BP 159/80 | HR 72 | Resp 16

## 2015-08-17 DIAGNOSIS — M722 Plantar fascial fibromatosis: Secondary | ICD-10-CM | POA: Diagnosis not present

## 2015-08-17 MED ORDER — TRIAMCINOLONE ACETONIDE 10 MG/ML IJ SUSP
10.0000 mg | Freq: Once | INTRAMUSCULAR | Status: AC
  Administered 2015-08-17: 10 mg

## 2015-08-17 MED ORDER — GABAPENTIN 300 MG PO CAPS: 300.0000 mg | ORAL_CAPSULE | Freq: Three times a day (TID) | ORAL | Status: DC

## 2015-08-17 MED FILL — GABAPENTIN 300 MG CAPSULE: 300 | 30 days supply | Qty: 90 | Fill #0

## 2015-08-17 NOTE — Progress Notes (Signed)
Subjective:     Patient ID: [name], male   DOB: 06/15/1960, 55 y.o.   MRN: [id number]  HPI patient presents with pain in the plantar heels that's improved some but still is present with pain at nighttime especially. States that he does get pain some when he walks he's not been able to exercise   Review of Systems     Objective:   Physical Exam Neurovascular status intact muscle strength was adequate with continued discomfort plantar fascial band bilateral with improvement and depression of the arch noted bilateral    Assessment:     Plantar fasciitis still present bilateral with mild improvement and discomfort at night which may be neuropathic in its origin    Plan:     Went ahead today and injected the plantar fascia again bilateral 3 mg Kenalog 5 mg Xylocaine and instructed on orthotics to lift the arch is with scanning done today. I did place him back on gabapentin 300 mg and he will start with one pill at night and if tolerates it well may consider one during the morning and 1 in the afternoon. Reappoint when orthotics ready or earlier if any issues should occur

## 2015-10-03 ENCOUNTER — Ambulatory Visit: Payer: BLUE CROSS/BLUE SHIELD | Admitting: *Deleted

## 2015-10-03 DIAGNOSIS — M722 Plantar fascial fibromatosis: Secondary | ICD-10-CM

## 2015-10-03 NOTE — Patient Instructions (Signed)

## 2015-10-03 NOTE — Progress Notes (Signed)
Patient ID: [name], male   DOB: [birth year], 55 y.o.   MRN: [id number] Patient presents for orthotic pick up.  Verbal and written break in and wear instructions given.  Patient will follow up in 4 weeks if symptoms worsen or fail to improve.

## 2015-11-01 MED FILL — FINASTERIDE 1 MG TABLET: 1 | 90 days supply | Qty: 90 | Fill #0

## 2015-11-13 MED FILL — LOSARTAN POTASSIUM 25 MG TA: 25 | 90 days supply | Qty: 90 | Fill #1

## 2015-12-05 MED FILL — GABAPENTIN 300 MG CAPSULE: 300 | 30 days supply | Qty: 90 | Fill #1

## 2016-01-23 ENCOUNTER — Encounter: Payer: Self-pay | Admitting: Family Medicine

## 2016-01-23 ENCOUNTER — Ambulatory Visit (INDEPENDENT_AMBULATORY_CARE_PROVIDER_SITE_OTHER): Payer: BLUE CROSS/BLUE SHIELD | Admitting: Family Medicine

## 2016-01-23 VITALS — BP 118/73 | HR 69 | Temp 97.6°F | Ht 66.0 in | Wt 172.6 lb

## 2016-01-23 DIAGNOSIS — R52 Pain, unspecified: Secondary | ICD-10-CM | POA: Diagnosis not present

## 2016-01-23 DIAGNOSIS — K5909 Other constipation: Secondary | ICD-10-CM | POA: Diagnosis not present

## 2016-01-23 MED ORDER — POLYETHYLENE GLYCOL 3350 17 G PO PACK: 17.0000 g | PACK | Freq: Every day | ORAL | 3 refills | Status: DC

## 2016-01-23 NOTE — Patient Instructions (Addendum)
You may use Tylenol for your pain. You may use Miralax for your constipation. Please eat three meals a day. Follow up with Dr. Erin Hearing  Dr. Gerlean Ren

## 2016-01-23 NOTE — Progress Notes (Signed)
Subjective:     Patient ID: [name], male   DOB: 12/11/1960, 55 y.o.   MRN: [id number]  HPI Mr. Deforest Hoyles is a 55 year old male presenting today with generalized pain. -States pain includes all of his muscles his abdomen, his entire body. -States pain is in all of these locations at the same time it has been constant for the last several months -Denies fevers, shortness of breath -Denies any difference in pain today other than what it has been for the past several months -Reports taking gabapentin 300 mg at night. He is not willing to go up on this dose. He is not willing to take Estring today. Does report it helps with his pain. -Unwilling to take any other medications for his pain -Reports eating 1 meal per day. Was instructed at last office visit to eat 3 or more miles a day as this is suspected to be contributing to his abdominal pain -Does note constipation. Hard stools -Former Smoker  Review of Systems Per HPI    Objective:   Physical Exam  Constitutional: He appears well-developed and well-nourished. No distress.  Cardiovascular: Normal rate and regular rhythm.   No murmur heard. Pulmonary/Chest: Effort normal. No respiratory distress. He has no wheezes.  Abdominal: Soft. He exhibits no distension.  Diffuse tenderness. Negative Murphy's. Negative rebound.  Musculoskeletal: He exhibits no edema.  Muscle strength 5 out of 5 in upper and lower extremities. Rotator cuff intact.  Neurological:  Sensation intact.       Assessment and Plan:     1. Generalized pain -No red flags noted. Appears to be unchanged over the last several months. -Unwilling to adjust gabapentin or try any other medications -Recommend over-the-counter Tylenol for pain -Follow-up with PCP  2. Constipation -Suspect be continued getting to abdominal pain. -MiraLAX prescribed -Recommend 3 meals per day

## 2016-02-02 MED FILL — LOSARTAN POTASSIUM 25 MG TA: 25 | 90 days supply | Qty: 90 | Fill #2

## 2016-04-17 ENCOUNTER — Ambulatory Visit: Payer: BLUE CROSS/BLUE SHIELD | Attending: Physical Medicine and Rehabilitation | Admitting: Physical Therapy

## 2016-04-17 ENCOUNTER — Encounter: Payer: Self-pay | Admitting: Physical Therapy

## 2016-04-17 DIAGNOSIS — M62838 Other muscle spasm: Secondary | ICD-10-CM | POA: Diagnosis present

## 2016-04-17 DIAGNOSIS — M542 Cervicalgia: Secondary | ICD-10-CM | POA: Insufficient documentation

## 2016-04-17 DIAGNOSIS — M6281 Muscle weakness (generalized): Secondary | ICD-10-CM | POA: Insufficient documentation

## 2016-04-17 NOTE — Patient Instructions (Addendum)
\    Patient advised not to do the bottom 3 exercises yet.

## 2016-04-17 NOTE — Therapy (Signed)
[place], [place], [address] Phone: [phone]   Fax:  [phone]  Physical Therapy Evaluation  Patient Details  Name: [name] MRN: [id number] Date of Birth: 22-Jan-1961 Referring Provider: Dr Suella Broad   Encounter Date: 04/17/2016      PT End of Session - 04/17/16 1049    Visit Number 1   Number of Visits 16   Date for PT Re-Evaluation 06/12/16   Authorization Type blue cross blue shield    PT Start Time 0800   PT Stop Time 0845   PT Time Calculation (min) 45 min   Activity Tolerance Patient tolerated treatment well   Behavior During Therapy [facility] for tasks assessed/performed      Past Medical History:  Diagnosis Date  . Abdominal pain 2009   CT abd pelvis     Past Surgical History:  Procedure Laterality Date  . CIRCUMCISION     Foreskin still partially attached to glans  . COLONOSCOPY  2009   [facility]  . HEMORRHOID SURGERY  2007    [place]  . UPPER GASTROINTESTINAL ENDOSCOPY  2009   [facility]    There were no vitals filed for this visit.       Subjective Assessment - 04/17/16 0808    Subjective Patient reports a long history of cervical pain. The pain runs down into his left arm. He also has numbness in his left arm. Driving makes his pain worse. he drives for a living. He gets a headache at times.    Pertinent History Plantar faciitus, peripheral neuropathy,    Limitations Walking   How long can you sit comfortably? No limit    How long can you stand comfortably? No limit    How long can you walk comfortably? No limit    Currently in Pain? Yes   Pain Score 7    Pain Location Abdomen   Pain Orientation Right;Left   Pain Descriptors / Indicators Aching   Pain Type Chronic pain   Pain Onset More than a month ago   Pain Frequency Intermittent   Aggravating Factors  driving   Pain Relieving Factors nothing at this time    Effect of Pain on Daily Activities  Difficulty perfroming ADL's             [facility] PT Assessment - 04/17/16 0001      Assessment   Medical Diagnosis Cervical spine pain with limited left radiculopathy    Referring Provider Dr Suella Broad    Onset Date/Surgical Date --  Several years prior    Hand Dominance Right   Next MD Visit None schedueled    Prior Therapy 10 years prior      Precautions   Precautions None     Restrictions   Weight Bearing Restrictions No     Balance Screen   Has the patient fallen in the past 6 months No   Has the patient had a decrease in activity level because of a fear of falling?  No   Is the patient reluctant to leave their home because of a fear of falling?  No     Home Environment   Living Environment Private residence   Additional Comments Nothing pertinent      Prior Function   Level of Independence Independent   Vocation Full time employment   Contractor    Leisure Nothing     Cognition   Overall Cognitive Status Within Functional Limits for  tasks assessed   Attention Focused   Focused Attention Appears intact   Memory Appears intact   Awareness Appears intact   Problem Solving Appears intact     Observation/Other Assessments   Observations Nothing significant    Focus on Therapeutic Outcomes (FOTO)  62% limitation      Sensation   Additional Comments Numbnees into the left arm into the whole ihand      Posture/Postural Control   Posture Comments Forward head      ROM / Strength   AROM / PROM / Strength AROM;PROM;Strength     AROM   AROM Assessment Site Cervical   Cervical Flexion WNL   Cervical Extension WNL slight pain going back    Cervical - Right Rotation 88 degrees    Cervical - Left Rotation 65 degrees      Strength   Strength Assessment Site Shoulder   Right/Left Shoulder Right;Left   Right Shoulder Flexion 5/5   Right Shoulder ABduction 5/5   Right Shoulder Internal Rotation 5/5   Right Shoulder External Rotation 5/5    Left Shoulder Flexion 4+/5   Left Shoulder Internal Rotation 5/5   Left Shoulder External Rotation 5/5     Palpation   Palpation comment Spasming  of bilateral upper traps R > L                    [facility] Adult PT Treatment/Exercise - 04/17/16 0001      Neck Exercises: Theraband   Shoulder Extension Limitations 2x10 yellow    Rows Limitations 2x10 yellow      Neck Exercises: Standing   Other Standing Exercises Standing tennis ball trigger point release to upper trap      Neck Exercises: Seated   Other Seated Exercise cervical rotation 2x10                 PT Education - 04/17/16 1049    Education provided Yes   Education Details continue with HEP.    Person(s) Educated Patient   Methods Explanation;Demonstration;Tactile cues;Verbal cues   Comprehension Returned demonstration;Verbalized understanding;Verbal cues required          PT Short Term Goals - 04/17/16 1050      PT SHORT TERM GOAL #1   Title Patient will increase left cervical rotation to 80 degrees without pain    Time 4   Period Weeks   Status New     PT SHORT TERM GOAL #2   Title Patient will demsotrate 5/5 gross bilateral upper extremity strength    Time 4   Period Weeks   Status New     PT SHORT TERM GOAL #3   Title Patient will report centralized pain and no numbness into the left shoudler    Time 4   Period Weeks   Status New     PT SHORT TERM GOAL #4   Title Patient will be independent with initial HEP    Time 4   Period Weeks   Status New           PT Long Term Goals - 04/17/16 1052      PT LONG TERM GOAL #1   Title Patient will ride in his car for 1 hour without increased pain    Time 8   Period Weeks   Status New     PT LONG TERM GOAL #2   Title Patient will sleep through the night without pain    Time 8  Period Weeks   Status New     PT LONG TERM GOAL #3   Title Patient will demsotrate a 48% deficit on FOTO   Time 8   Period Weeks   Status New                Plan - 04/17/16 1650    Clinical Impression Statement Patient is a 56 year old male who presents with cervical spine pain and cervical radiculopathy into his left arm. He presents with weakness and decreased cerivcal rotiation. He drives long distances. He would benefit from posturla correction as well as manual therapy toreduce spasming and cervical nerve compression. He was seen for a low complexity evaluation.    Rehab Potential Good   PT Frequency 2x / week   PT Duration 8 weeks   PT Treatment/Interventions ADLs/Self Care Home Management;Cryotherapy;Electrical Stimulation;Iontophoresis 4mg /ml Dexamethasone;Moist Heat;Traction;Ultrasound;Gait training;Stair training;Neuromuscular re-education;Therapeutic exercise;Therapeutic activities;Patient/family education;Manual techniques;Splinting;Taping;Dry needling;Passive range of motion   PT Next Visit Plan continue with manual therapy to the upper trap. Add cervical retraction, add shoulder flexion strengthening in supine, Progress from yellow band if able. Consider modalities if indicted. Continue with suboccipital release and muanul cervical tranction. Consider upper trap stretch, and levator stretch   PT Home Exercise Plan scap retraction, shoulder extension, cervical rotation    Consulted and Agree with Plan of Care Patient      Patient will benefit from skilled therapeutic intervention in order to improve the following deficits and impairments:  Decreased activity tolerance, Decreased strength, Postural dysfunction, Decreased range of motion, Increased muscle spasms, Impaired UE functional use, Decreased knowledge of use of DME, Decreased safety awareness, Decreased endurance  Visit Diagnosis: Cervicalgia - Plan: PT plan of care cert/re-cert  Other muscle spasm - Plan: PT plan of care cert/re-cert  Muscle weakness (generalized) - Plan: PT plan of care cert/re-cert     Problem List Patient Active Problem List    Diagnosis Date Noted  . Penile discharge 04/26/2015  . Headache 04/26/2015  . Foot pain, bilateral 04/12/2015  . Low HDL (under 40) 07/20/2013  . Neuropathy ([place]) 07/19/2013  . HYPERTENSION, BENIGN ESSENTIAL 04/14/2009  . RECTAL PAIN 12/08/2007    Carney Living  PT DPT  04/17/2016, 4:58 PM  [facility] [address] [place], [place], [address] Phone: [phone]   Fax:  [phone]  Name: [name] MRN: [id number] Date of Birth: 01/11/1961

## 2016-04-25 ENCOUNTER — Ambulatory Visit: Payer: BLUE CROSS/BLUE SHIELD | Admitting: Physical Therapy

## 2016-04-26 ENCOUNTER — Ambulatory Visit: Payer: BLUE CROSS/BLUE SHIELD | Admitting: Physical Therapy

## 2016-04-26 DIAGNOSIS — M6281 Muscle weakness (generalized): Secondary | ICD-10-CM

## 2016-04-26 DIAGNOSIS — M62838 Other muscle spasm: Secondary | ICD-10-CM

## 2016-04-26 DIAGNOSIS — M542 Cervicalgia: Secondary | ICD-10-CM | POA: Diagnosis not present

## 2016-04-26 NOTE — Patient Instructions (Signed)
Levator Stretch   Grasp seat or sit on hand on side to be stretched. Turn head toward other side and look down. Use hand on head to gently stretch neck in that position. Hold _30___ seconds. Repeat on other side. Repeat _2-3___ times. Do __2__ sessions per day.  http://gt2.exer.us/30   Copyright  VHI. All rights reserved.  Side-Bending   One hand on opposite side of head, pull head to side as far as is comfortable. Stop if there is pain. Hold __30__ seconds. Repeat with other hand to other side. Repeat __2-3__ times. Do __2__ sessions per day.   Copyright  VHI. All rights reserved.  Scapular Retraction (Standing)   With arms at sides, pinch shoulder blades together. Repeat __10__ times per set. Do _1___ sets per session. Do _2___ sessions per day.  http://orth.exer.us/944

## 2016-04-26 NOTE — Therapy (Signed)
[place], [place], [address] Phone: [phone]   Fax:  [phone]  Physical Therapy Treatment  Patient Details  Name: [name] MRN: [id number] Date of Birth: 05-31-1960 Referring Provider: Dr Suella Broad   Encounter Date: 04/26/2016      PT End of Session - 04/26/16 0836    Visit Number 2   Number of Visits 16   Date for PT Re-Evaluation 06/12/16   Authorization Type blue cross blue shield    PT Start Time 0815  pt late, locked keys in car    PT Stop Time 0905   PT Time Calculation (min) 50 min   Activity Tolerance Patient tolerated treatment well   Behavior During Therapy [facility] for tasks assessed/performed      Past Medical History:  Diagnosis Date  . Abdominal pain 2009   CT abd pelvis     Past Surgical History:  Procedure Laterality Date  . CIRCUMCISION     Foreskin still partially attached to glans  . COLONOSCOPY  2009   [place]  . HEMORRHOID SURGERY  2007    [facility]  . UPPER GASTROINTESTINAL ENDOSCOPY  2009   [place]    There were no vitals filed for this visit.      Subjective Assessment - 04/26/16 0818    Subjective Presents with mod pain in post cervicals and L UE (min numbness).  No changes since eval.  He had therapy for this problem 9-10 yrs ago.     Currently in Pain? Yes   Pain Score 5    Pain Location Neck   Pain Orientation Right;Left;Lateral;Posterior   Pain Descriptors / Indicators Numbness;Aching   Pain Type Chronic pain   Pain Onset More than a month ago   Pain Frequency Intermittent   Aggravating Factors  driving, sitting long periods   Pain Relieving Factors meds                         [facility] Adult PT Treatment/Exercise - 04/26/16 0828      Neck Exercises: Theraband   Shoulder Extension 20 reps;Red   Rows 20 reps;Red     Shoulder Exercises: ROM/Strengthening   UBE (Upper Arm Bike) 5 min forward with  cues for posture      Modalities   Modalities Moist Heat     Moist Heat Therapy   Number Minutes Moist Heat 10 Minutes   Moist Heat Location Cervical     Manual Therapy   Soft tissue mobilization post cervicals and suboccipitals   Passive ROM lateral flexion, rotation    Manual Traction 5 x 20 sec      Neck Exercises: Stretches   Upper Trapezius Stretch 2 reps;30 seconds   Levator Stretch 2 reps;30 seconds   Corner Stretch 3 reps;30 seconds   Other Neck Stretches cervical rotation 30 sec, x 2 with hand on chin to incr                 PT Education - 04/26/16 0836    Education provided Yes   Education Details HEP, posture    Person(s) Educated Patient;Caregiver(s)   Methods Explanation;Demonstration;Handout   Comprehension Verbalized understanding;Returned demonstration;Need further instruction          PT Short Term Goals - 04/26/16 0857      PT SHORT TERM GOAL #1   Title Patient will increase left cervical rotation to 80 degrees without pain    Status  On-going     PT SHORT TERM GOAL #2   Title Patient will demsotrate 5/5 gross bilateral upper extremity strength    Status On-going     PT SHORT TERM GOAL #3   Title Patient will report centralized pain and no numbness into the left shoudler    Status On-going     PT SHORT TERM GOAL #4   Title Patient will be independent with initial HEP    Status On-going           PT Long Term Goals - 04/26/16 0857      PT LONG TERM GOAL #1   Title Patient will ride in his car for 1 hour without increased pain    Status On-going     PT LONG TERM GOAL #2   Title Patient will sleep through the night without pain    Status On-going     PT LONG TERM GOAL #3   Title Patient will demsotrate a 48% deficit on FOTO   Status On-going               Plan - 04/26/16 [id number]    Clinical Impression Statement Pt did well today, but did not do HEP as directed.  Neck and shoulder tension very high and chronic postural imbalance has  contributed to his symptoms.     PT Next Visit Plan continue with manual therapy to the upper trap. Add cervical retraction, add shoulder flexion strengthening in supine, Progress from yellow band if able. Consider modalities if indicted. Continue with suboccipital release and muanul cervical tranction. Consider upper trap stretch, and levator stretch   PT Home Exercise Plan scap retraction, shoulder extension, cervical rotation, upper trap and levator stretch   Consulted and Agree with Plan of Care Patient      Patient will benefit from skilled therapeutic intervention in order to improve the following deficits and impairments:  Decreased activity tolerance, Decreased strength, Postural dysfunction, Decreased range of motion, Increased muscle spasms, Impaired UE functional use, Decreased knowledge of use of DME, Decreased safety awareness, Decreased endurance  Visit Diagnosis: Cervicalgia  Other muscle spasm  Muscle weakness (generalized)     Problem List Patient Active Problem List   Diagnosis Date Noted  . Penile discharge 04/26/2015  . Headache 04/26/2015  . Foot pain, bilateral 04/12/2015  . Low HDL (under 40) 07/20/2013  . Neuropathy ([place]) 07/19/2013  . HYPERTENSION, BENIGN ESSENTIAL 04/14/2009  . RECTAL PAIN 12/08/2007    Rhyleigh Grassel 04/26/2016, 9:01 AM  [facility] [address] [place], [place], [address] Phone: [phone]   Fax:  [phone]  Name: [name] MRN: [id number] Date of Birth: Aug 18, 1960  Raeford Razor, PT 04/26/16 9:01 AM Phone: [phone] Fax: [phone]

## 2016-04-30 ENCOUNTER — Ambulatory Visit: Payer: BLUE CROSS/BLUE SHIELD | Admitting: Physical Therapy

## 2016-04-30 DIAGNOSIS — M542 Cervicalgia: Secondary | ICD-10-CM | POA: Diagnosis not present

## 2016-04-30 DIAGNOSIS — M62838 Other muscle spasm: Secondary | ICD-10-CM

## 2016-04-30 DIAGNOSIS — M6281 Muscle weakness (generalized): Secondary | ICD-10-CM

## 2016-04-30 NOTE — Therapy (Signed)
[place], [place], [address] Phone: [phone]   Fax:  [phone]  Physical Therapy Treatment  Patient Details  Name: [name] MRN: [id number] Date of Birth: [birth year] Referring Provider: Dr Suella Broad   Encounter Date: 04/30/2016      PT End of Session - 04/30/16 0905    Visit Number 3   Number of Visits 16   Date for PT Re-Evaluation 06/12/16   Authorization Type blue cross blue shield    PT Start Time 0800   PT Stop Time 0853   PT Time Calculation (min) 53 min   Activity Tolerance Patient tolerated treatment well   Behavior During Therapy [facility] for tasks assessed/performed      Past Medical History:  Diagnosis Date  . Abdominal pain 2009   CT abd pelvis     Past Surgical History:  Procedure Laterality Date  . CIRCUMCISION     Foreskin still partially attached to glans  . COLONOSCOPY  2009   [place]  . HEMORRHOID SURGERY  2007    [place]  . UPPER GASTROINTESTINAL ENDOSCOPY  2009   [place]    There were no vitals filed for this visit.      Subjective Assessment - 04/30/16 0804    Subjective Patient feels like his pain is improving. He continues to feel like he is having some pain in his neck but it is improved. His left arm is still going numb at night.    Pertinent History Plantar faciitus, peripheral neuropathy,    Currently in Pain? No/denies                         [facility] Adult PT Treatment/Exercise - 04/30/16 0001      Neck Exercises: Theraband   Shoulder Extension 20 reps;Red   Rows 20 reps;Red     Neck Exercises: Seated   Other Seated Exercise Setaed er RED BAND  with cuing for posture.    Other Seated Exercise advised to do 2-3x per day      Modalities   Modalities Moist Heat     Moist Heat Therapy   Number Minutes Moist Heat 10 Minutes   Moist Heat Location Cervical     Manual Therapy   Manual therapy comments IASTYM to upper  traps in sitting; Grade II AP Mobilixzations tro c3 through c6; sub occipital release, manual cervical traction     Neck Exercises: Stretches   Upper Trapezius Stretch 2 reps;30 seconds   Levator Stretch 2 reps;30 seconds   Corner Stretch 3 reps;30 seconds   Other Neck Stretches cervical rotation 4x within pain free range                 PT Education - 04/30/16 0904    Education provided Yes   Education Details reviewed HEP, educated on posture    Person(s) Educated Patient   Methods Explanation;Demonstration   Comprehension Verbalized understanding;Returned demonstration;Need further instruction          PT Short Term Goals - 04/30/16 0913      PT SHORT TERM GOAL #1   Title Patient will increase left cervical rotation to 80 degrees without pain    Time 4   Period Weeks   Status On-going     PT SHORT TERM GOAL #2   Title Patient will demsotrate 5/5 gross bilateral upper extremity strength    Time 4   Period Weeks   Status  On-going     PT SHORT TERM GOAL #3   Title Patient will report centralized pain and no numbness into the left shoudler    Time 4   Period Weeks   Status On-going     PT SHORT TERM GOAL #4   Title Patient will be independent with initial HEP    Time 4   Period Weeks   Status On-going           PT Long Term Goals - 04/26/16 0857      PT LONG TERM GOAL #1   Title Patient will ride in his car for 1 hour without increased pain    Status On-going     PT LONG TERM GOAL #2   Title Patient will sleep through the night without pain    Status On-going     PT LONG TERM GOAL #3   Title Patient will demsotrate a 48% deficit on FOTO   Status On-going               Plan - 04/30/16 0908    Clinical Impression Statement Patient tolerated treatment well. He reports he has been more complaint with his HEP. Therapy reviewed the rationale behind his exercises and posture. He had no increase in pain with treatment. He continues to have  tightness in his bilateral upper traps.    PT Frequency 2x / week   PT Duration 8 weeks   PT Treatment/Interventions ADLs/Self Care Home Management;Cryotherapy;Electrical Stimulation;Iontophoresis 4mg /ml Dexamethasone;Moist Heat;Traction;Ultrasound;Gait training;Stair training;Neuromuscular re-education;Therapeutic exercise;Therapeutic activities;Patient/family education;Manual techniques;Splinting;Taping;Dry needling;Passive range of motion   PT Next Visit Plan continue with manual therapy to the upper trap. Add cervical retraction, add shoulder flexion strengthening in supine, Progress from yellow band if able. Consider modalities if indicted. Continue with suboccipital release and muanul cervical tranction. Consider upper trap stretch, and levator stretch   PT Home Exercise Plan scap retraction, shoulder extension, cervical rotation, upper trap and levator stretch   Consulted and Agree with Plan of Care Patient      Patient will benefit from skilled therapeutic intervention in order to improve the following deficits and impairments:  Decreased activity tolerance, Decreased strength, Postural dysfunction, Decreased range of motion, Increased muscle spasms, Impaired UE functional use, Decreased knowledge of use of DME, Decreased safety awareness, Decreased endurance  Visit Diagnosis: Cervicalgia  Other muscle spasm  Muscle weakness (generalized)     Problem List Patient Active Problem List   Diagnosis Date Noted  . Penile discharge 04/26/2015  . Headache 04/26/2015  . Foot pain, bilateral 04/12/2015  . Low HDL (under 40) 07/20/2013  . Neuropathy ([place]) 07/19/2013  . HYPERTENSION, BENIGN ESSENTIAL 04/14/2009  . RECTAL PAIN 12/08/2007    Carney Living PT DPT  04/30/2016, 8:11 PM  [facility] [address] [place], [place], [address] Phone: [phone]   Fax:  [phone]  Name: [name] MRN: [id number] Date of  Birth: 09-22-1960

## 2016-05-03 ENCOUNTER — Ambulatory Visit: Payer: BLUE CROSS/BLUE SHIELD | Admitting: Physical Therapy

## 2016-05-07 ENCOUNTER — Ambulatory Visit: Payer: BLUE CROSS/BLUE SHIELD | Admitting: Physical Therapy

## 2016-05-07 DIAGNOSIS — M62838 Other muscle spasm: Secondary | ICD-10-CM

## 2016-05-07 DIAGNOSIS — M542 Cervicalgia: Secondary | ICD-10-CM

## 2016-05-07 DIAGNOSIS — M6281 Muscle weakness (generalized): Secondary | ICD-10-CM

## 2016-05-07 MED FILL — LOSARTAN POTASSIUM 25 MG TA: 25 | 90 days supply | Qty: 90 | Fill #3

## 2016-05-07 NOTE — Therapy (Signed)
[place], [place], [address] Phone: [phone]   Fax:  [phone]  Physical Therapy Treatment  Patient Details  Name: [name] MRN: [id number] Date of Birth: 13-Jul-1960 Referring Provider: Dr Suella Broad   Encounter Date: 05/07/2016      PT End of Session - 05/07/16 0825    Visit Number 4   Number of Visits 16   Date for PT Re-Evaluation 06/12/16   PT Start Time 0810   PT Stop Time 0850   PT Time Calculation (min) 40 min   Activity Tolerance Patient tolerated treatment well   Behavior During Therapy [facility] for tasks assessed/performed      Past Medical History:  Diagnosis Date  . Abdominal pain 2009   CT abd pelvis     Past Surgical History:  Procedure Laterality Date  . CIRCUMCISION     Foreskin still partially attached to glans  . COLONOSCOPY  2009   [place]  . HEMORRHOID SURGERY  2007    [place]  . UPPER GASTROINTESTINAL ENDOSCOPY  2009   [place]    There were no vitals filed for this visit.      Subjective Assessment - 05/07/16 0809    Subjective Still numb in L UE, min pain in neck.  I wonder if I sleep on my hands, arm wrong. USed to run 4 miles a day, foot pain prevents him now.  Wonders about getting a bike.    Currently in Pain? Yes   Pain Score 3    Pain Location Neck   Pain Orientation Left;Lateral   Pain Descriptors / Indicators Aching;Numbness   Pain Type Chronic pain   Pain Onset More than a month ago   Pain Frequency Intermittent   Aggravating Factors  driving, sitting too long    Pain Relieving Factors meds, stretching, my daugher walks on my back    Effect of Pain on Daily Activities impacts work and ability to sit, drive                           [facility] Adult PT Treatment/Exercise - 05/07/16 0821      Neck Exercises: Theraband   Other Theraband Exercises supine scapular stabilization progression with red band x 10: overhead,  horizontal pull x 10, sash x 10 each side      Shoulder Exercises: ROM/Strengthening   UBE (Upper Arm Bike) 6 min level 1, 3 min FW and 3 min back cues for posture      Traction   Type of Traction Cervical   Min (lbs) 6   Max (lbs) 12  incr to 14    Hold Time 60   Rest Time 15   Time 15     Neck Exercises: Stretches   Upper Trapezius Stretch 2 reps;30 seconds   Levator Stretch 2 reps;30 seconds   Other Neck Stretches small ROM into soft ball: nods, rotation               PT Education - 05/07/16 1034    Education provided Yes   Education Details importance of doing HEP , recumbant bike OK    Person(s) Educated Patient   Methods Explanation   Comprehension Verbalized understanding          PT Short Term Goals - 04/30/16 0913      PT SHORT TERM GOAL #1   Title Patient will increase left cervical rotation to 80 degrees  without pain    Time 4   Period Weeks   Status On-going     PT SHORT TERM GOAL #2   Title Patient will demsotrate 5/5 gross bilateral upper extremity strength    Time 4   Period Weeks   Status On-going     PT SHORT TERM GOAL #3   Title Patient will report centralized pain and no numbness into the left shoudler    Time 4   Period Weeks   Status On-going     PT SHORT TERM GOAL #4   Title Patient will be independent with initial HEP    Time 4   Period Weeks   Status On-going           PT Long Term Goals - 04/26/16 0857      PT LONG TERM GOAL #1   Title Patient will ride in his car for 1 hour without increased pain    Status On-going     PT LONG TERM GOAL #2   Title Patient will sleep through the night without pain    Status On-going     PT LONG TERM GOAL #3   Title Patient will demsotrate a 48% deficit on FOTO   Status On-going               Plan - 05/07/16 [id number]    Clinical Impression Statement Pt with increased tension in bilateral upper traps and needed cues to relax.  Trial of traction to ease L arm numbness,  seemed to respond favorably after treatment.   Does not stretch regularly.    PT Next Visit Plan continue with manual therapy to the upper trap, see how traction was.  . Add cervical retraction, add shoulder flexion strengthening in supine, Consider modalities if indicted.     PT Home Exercise Plan scap retraction, shoulder extension, cervical rotation, upper trap and levator stretch   Consulted and Agree with Plan of Care Patient      Patient will benefit from skilled therapeutic intervention in order to improve the following deficits and impairments:  Decreased activity tolerance, Decreased strength, Postural dysfunction, Decreased range of motion, Increased muscle spasms, Impaired UE functional use, Decreased knowledge of use of DME, Decreased safety awareness, Decreased endurance  Visit Diagnosis: Cervicalgia  Other muscle spasm  Muscle weakness (generalized)     Problem List Patient Active Problem List   Diagnosis Date Noted  . Penile discharge 04/26/2015  . Headache 04/26/2015  . Foot pain, bilateral 04/12/2015  . Low HDL (under 40) 07/20/2013  . Neuropathy ([place]) 07/19/2013  . HYPERTENSION, BENIGN ESSENTIAL 04/14/2009  . RECTAL PAIN 12/08/2007    PAA,JENNIFER 05/07/2016, 10:38 AM  [facility] [address] [place], [place], [address] Phone: [phone]   Fax:  [phone]  Name: [name] MRN: [id number] Date of Birth: June 18, 1960  Raeford Razor, PT 05/07/16 10:39 AM Phone: [phone] Fax: [phone]

## 2016-05-09 ENCOUNTER — Ambulatory Visit: Payer: BLUE CROSS/BLUE SHIELD | Admitting: Physical Therapy

## 2016-05-14 ENCOUNTER — Ambulatory Visit: Payer: BLUE CROSS/BLUE SHIELD | Attending: Physical Medicine and Rehabilitation | Admitting: Physical Therapy

## 2016-05-14 DIAGNOSIS — M62838 Other muscle spasm: Secondary | ICD-10-CM | POA: Insufficient documentation

## 2016-05-14 DIAGNOSIS — M542 Cervicalgia: Secondary | ICD-10-CM | POA: Insufficient documentation

## 2016-05-14 DIAGNOSIS — M6281 Muscle weakness (generalized): Secondary | ICD-10-CM | POA: Diagnosis present

## 2016-05-14 NOTE — Therapy (Addendum)
[place], [place], [address] Phone: [phone]   Fax:  [phone]  Physical Therapy Treatment/Discharge Addendum  Patient Details  Name: [name] MRN: [id number] Date of Birth: [birth year] Referring Provider: Dr Suella Broad   Encounter Date: 05/14/2016      PT End of Session - 05/14/16 0829    Visit Number 5   Number of Visits 16   Date for PT Re-Evaluation 06/12/16   Authorization Type blue cross blue shield    PT Start Time 0805   PT Stop Time 0900   PT Time Calculation (min) 55 min   Activity Tolerance Patient tolerated treatment well   Behavior During Therapy [facility] for tasks assessed/performed      Past Medical History:  Diagnosis Date  . Abdominal pain 2009   CT abd pelvis     Past Surgical History:  Procedure Laterality Date  . CIRCUMCISION     Foreskin still partially attached to glans  . COLONOSCOPY  2009   [place]  . HEMORRHOID SURGERY  2007    [place]  . UPPER GASTROINTESTINAL ENDOSCOPY  2009   [place]    There were no vitals filed for this visit.      Subjective Assessment - 05/14/16 0810    Subjective Liked the traction, helped for some time afterwards.  No pain now.  No numbness.  Bottom of foot hurts as usual. Reported legs are numb at times and sometimes wakes with L side numb (including legs).     Pertinent History Plantar faciitus, peripheral neuropathy,    Currently in Pain? No/denies          [facility] Adult PT Treatment/Exercise - 05/14/16 0823      Self-Care   Self-Care Posture   Posture sitting, scap position, progress, numbness in LE, HEP      Neck Exercises: Theraband   Other Theraband Exercises supine scapular stabilization progression with green band x 10: overhead, horizontal pull x 10     Shoulder Exercises: ROM/Strengthening   UBE (Upper Arm Bike) 6 min level 1, 3 min FW and 3 min back cues for posture    Other ROM/Strengthening Exercises  upper trunk rotation (book openings x 5) for pec and thoracic    Other ROM/Strengthening Exercises standing scapular mobility with foam roller on wall : plank on elbows,      Traction   Type of Traction Cervical   Min (lbs) 8   Max (lbs) 14   Hold Time 60   Rest Time 15   Time 15     Neck Exercises: Stretches   Corner Stretch 5 reps;20 seconds  at various angles                   PT Short Term Goals - 05/14/16 0814      PT SHORT TERM GOAL #1   Title Patient will increase left cervical rotation to 80 degrees without pain    Baseline 70-75 deg bilat   Status Partially Met     PT SHORT TERM GOAL #2   Title Patient will demsotrate 5/5 gross bilateral upper extremity strength      PT SHORT TERM GOAL #3   Title Patient will report centralized pain and no numbness into the left shoudler            PT Long Term Goals - 05/14/16 5956      PT LONG TERM GOAL #1   Title Patient will ride  in his car for 1 hour without increased pain    Baseline >2-3 hours legs are numb and pain all over    Status Achieved     PT LONG TERM GOAL #2   Title Patient will sleep through the night without pain    Baseline often wakes with pain, does not sleep >4 hours   Status Partially Met     PT LONG TERM GOAL #3   Title Patient will demsotrate a 48% deficit on FOTO   Status Unable to assess               Plan - 05/14/16 6226    Clinical Impression Statement Pt needs cues to relax neck and shoulder tension.  Worked on thoracic and scapular mobility today and included traction as he did feel relief of pain last visit.  Advised him to tell MD about the LE numbness (does not seem like neuropathy)    PT Next Visit Plan continue with manual therapy to the upper trap,  traction .    PT Home Exercise Plan scap retraction, shoulder extension, cervical rotation, upper trap and levator stretch   Consulted and Agree with Plan of Care Patient      Patient will benefit from skilled  therapeutic intervention in order to improve the following deficits and impairments:  Decreased activity tolerance, Decreased strength, Postural dysfunction, Decreased range of motion, Increased muscle spasms, Impaired UE functional use, Decreased knowledge of use of DME, Decreased safety awareness, Decreased endurance  Visit Diagnosis: Cervicalgia  Other muscle spasm  Muscle weakness (generalized)     Problem List Patient Active Problem List   Diagnosis Date Noted  . Penile discharge 04/26/2015  . Headache 04/26/2015  . Foot pain, bilateral 04/12/2015  . Low HDL (under 40) 07/20/2013  . Neuropathy ([place]) 07/19/2013  . HYPERTENSION, BENIGN ESSENTIAL 04/14/2009  . RECTAL PAIN 12/08/2007    [name] 05/14/2016, 12:40 PM  [facility] [address] [place], [place], [address] Phone: [phone]   Fax:  [phone]  Name: [name] MRN: [id number] Date of Birth: 18-Dec-1960  Raeford Razor, PT 05/14/16 12:40 PM Phone: [phone] Fax: [phone]  PHYSICAL THERAPY DISCHARGE SUMMARY  Visits from Start of Care: 5  Current functional level related to goals / functional outcomes: See above for most recent    Remaining deficits: Pa[place] , sensory, AROM in cervical spine ,posture  Education / Equipment: HEP, posture, traction   Plan: Patient agrees to discharge.  Patient goals were partially met. Patient is being discharged due to not returning since the last visit.  ?????    Raeford Razor, PT 06/20/16 4:23 PM Phone: [phone] Fax: [phone]

## 2016-05-16 ENCOUNTER — Ambulatory Visit: Payer: BLUE CROSS/BLUE SHIELD | Admitting: Physical Therapy

## 2016-05-21 ENCOUNTER — Ambulatory Visit: Payer: BLUE CROSS/BLUE SHIELD | Admitting: Physical Therapy

## 2016-07-24 ENCOUNTER — Ambulatory Visit (INDEPENDENT_AMBULATORY_CARE_PROVIDER_SITE_OTHER): Payer: BLUE CROSS/BLUE SHIELD | Admitting: Family Medicine

## 2016-07-24 ENCOUNTER — Encounter: Payer: Self-pay | Admitting: Family Medicine

## 2016-07-24 DIAGNOSIS — R5383 Other fatigue: Secondary | ICD-10-CM

## 2016-07-24 LAB — POCT URINALYSIS DIP (MANUAL ENTRY)
Bilirubin, UA: NEGATIVE
GLUCOSE UA: NEGATIVE mg/dL
Ketones, POC UA: NEGATIVE mg/dL
Leukocytes, UA: NEGATIVE
NITRITE UA: NEGATIVE
Protein Ur, POC: NEGATIVE mg/dL
RBC UA: NEGATIVE
Spec Grav, UA: 1.015 (ref 1.010–1.025)
Urobilinogen, UA: 0.2 E.U./dL
pH, UA: 7.5 (ref 5.0–8.0)

## 2016-07-24 NOTE — Assessment & Plan Note (Signed)
Newly worsened.  No signs of acute disease on exam.  Will check labs.  I suspect a functional do such as fibromyalgia or somatic disorder Will address further on follow up

## 2016-07-24 NOTE — Progress Notes (Signed)
Subjective  Patient is presenting with the following illnesses  Fatigue associated with chronic frequent urination, chronic rectal pain, chronic lower leg pain.  No weight loss or fevers or adenopathy.  No longer takes gabapentin.  Is taking finasteride for hair loss  Does not exercise running hurts his legs.  Works as a Geophysicist/field seismologist.  Watches TV for fun but does not really enjoy it   Chief Complaint noted Review of Symptoms - see HPI PMH - Smoking status noted.     Objective Vital Signs reviewed Ears:  External ear exam shows no significant lesions or deformities.  Otoscopic examination reveals clear canals, tympanic membranes are intact bilaterally without bulging, retraction, inflammation or discharge. Hearing is grossly normal bilaterall Neck:  No deformities, thyromegaly, masses, or tenderness noted.   Supple with full range of motion without pain. Eye - Pupils Equal Round Reactive to light, Extraocular movements intact,, Conjunctiva without redness or discharge Heart - Regular rate and rhythm.  No murmurs, gallops or rubs.    Lungs:  Normal respiratory effort, chest expands symmetrically. Lungs are clear to auscultation, no crackles or wheezes. Abdomen: soft and non-tender without masses, organomegaly or hernias noted.   Rectal: normal size firm prostate without masses or irregularities Testes slighly soft no lesions No penile discharge Small spots of hypopigmentation on lower abdomen  Normal gait      Assessments/Plans  No problem-specific Assessment & Plan notes found for this encounter.   See Encounter view if individual problem A/Ps not visible See after visit summary for details of patient instuctions

## 2016-07-24 NOTE — Patient Instructions (Signed)
Good to see you today!  Thanks for coming in.  I will call you if your lab tests are not normal.  Otherwise we will discuss them at your next visit.  Try to starting cycling slowly for exercise  Cut back on sweets and fatty foods  Come back in 3-4 weeks

## 2016-07-25 LAB — CBC
Hematocrit: 43.3 % (ref 37.5–51.0)
Hemoglobin: 14.4 g/dL (ref 13.0–17.7)
MCH: 27.7 pg (ref 26.6–33.0)
MCHC: 33.3 g/dL (ref 31.5–35.7)
MCV: 83 fL (ref 79–97)
Platelets: 298 10*3/uL (ref 150–379)
RBC: 5.19 x10E6/uL (ref 4.14–5.80)
RDW: 15.8 % — AB (ref 12.3–15.4)
WBC: 6.9 10*3/uL (ref 3.4–10.8)

## 2016-07-25 LAB — HIV ANTIBODY (ROUTINE TESTING W REFLEX): HIV SCREEN 4TH GENERATION: NONREACTIVE

## 2016-07-25 LAB — COMPREHENSIVE METABOLIC PANEL
ALBUMIN: 4.6 g/dL (ref 3.5–5.5)
ALT: 24 IU/L (ref 0–44)
AST: 22 IU/L (ref 0–40)
Albumin/Globulin Ratio: 1.8 (ref 1.2–2.2)
Alkaline Phosphatase: 53 IU/L (ref 39–117)
BUN/Creatinine Ratio: 17 (ref 9–20)
BUN: 14 mg/dL (ref 6–24)
Bilirubin Total: <0.2 mg/dL (ref 0.0–1.2)
CALCIUM: 9.8 mg/dL (ref 8.7–10.2)
CO2: 20 mmol/L (ref 18–29)
CREATININE: 0.84 mg/dL (ref 0.76–1.27)
Chloride: 101 mmol/L (ref 96–106)
GFR calc Af Amer: 113 mL/min/{1.73_m2} (ref >59)
GFR, EST NON AFRICAN AMERICAN: 98 mL/min/{1.73_m2} (ref >59)
Globulin, Total: 2.6 g/dL (ref 1.5–4.5)
Glucose: 93 mg/dL (ref 65–99)
Potassium: 4.2 mmol/L (ref 3.5–5.2)
Sodium: 142 mmol/L (ref 134–144)
Total Protein: 7.2 g/dL (ref 6.0–8.5)

## 2016-07-25 LAB — TSH: TSH: 1.15 u[IU]/mL (ref 0.450–4.500)

## 2016-07-25 LAB — CK: Total CK: 137 U/L (ref 24–204)

## 2016-08-02 ENCOUNTER — Other Ambulatory Visit: Payer: Self-pay | Admitting: Family Medicine

## 2016-08-02 MED FILL — LOSARTAN POTASSIUM 25 MG TA: 25 | 90 days supply | Qty: 90 | Fill #0

## 2016-08-02 MED FILL — FINASTERIDE 1 MG TABLET: 1 | 90 days supply | Qty: 90 | Fill #1

## 2016-08-21 ENCOUNTER — Ambulatory Visit (INDEPENDENT_AMBULATORY_CARE_PROVIDER_SITE_OTHER): Payer: BLUE CROSS/BLUE SHIELD | Admitting: Family Medicine

## 2016-08-21 ENCOUNTER — Encounter: Payer: Self-pay | Admitting: Family Medicine

## 2016-08-21 DIAGNOSIS — I10 Essential (primary) hypertension: Secondary | ICD-10-CM

## 2016-08-21 DIAGNOSIS — R5383 Other fatigue: Secondary | ICD-10-CM

## 2016-08-21 DIAGNOSIS — L80 Vitiligo: Secondary | ICD-10-CM

## 2016-08-21 NOTE — Progress Notes (Signed)
Subjective  Patient is presenting with the following illnesses  Fatigue Muscle aches About the same all over in major muscle groups.  No weakness or weight loss or fever or joint swelling.  Is exercising with stationary bike which helps  Vitiligo Has spots on both elbows and one on stomach.  Using [place] clobest[place]ol (high potency steroid)  No new lesion or pustules  HYPERTENSION Disease Monitoring  Home BP Monitoring (Severity) not checking Symptoms - Chest pain- no    Dyspnea- no Medications (Modifying factors) Compliance-  Taking daily. Lightheadedness-  no  Edema- no Timing - continuous  Duration - years ROS - See HPI  PMH Lab Review   Potassium  Date Value Ref Range Status  07/24/2016 4.2 3.5 - 5.2 mmol/L Final   Sodium  Date Value Ref Range Status  07/24/2016 142 134 - 144 mmol/L Final   Creat  Date Value Ref Range Status  06/09/2015 1.03 0.70 - 1.33 mg/dL Final   Creatinine, Ser  Date Value Ref Range Status  07/24/2016 0.84 0.76 - 1.27 mg/dL Final       Chief Complaint noted Review of Symptoms - see HPI PMH - Smoking status noted.     Objective Vital Signs reviewed Neck:  No deformities, thyromegaly, masses, or tenderness noted.   Supple with full range of motion without pain. Skin well defined macules of hypopigmentation on inner elbows and anterior stomach area   Assessments/Plans  No problem-specific Assessment & Plan notes found for this encounter.   See Encounter view if individual problem A/Ps not visible See after visit summary for details of patient instuctions

## 2016-08-21 NOTE — Assessment & Plan Note (Signed)
At goal.  Hopefully will improve with weight loss

## 2016-08-21 NOTE — Assessment & Plan Note (Signed)
Seems stable.  Recommend using the steroid topical as needed

## 2016-08-21 NOTE — Assessment & Plan Note (Addendum)
Improved with exercise.  Seems consistent with a fibromyalgia variant.  Normal lab work up.  Recommend continue exercise.  Cautioned against any neck surgery.  He will send me records from his neck doctor

## 2016-08-21 NOTE — Patient Instructions (Addendum)
Fax [phone] your records I will send you a message  Continue exercise  Cut back on calories - smaller portions  You need a colonoscopy in 2019  Come back in 6 months to 1 year

## 2016-10-28 ENCOUNTER — Ambulatory Visit (INDEPENDENT_AMBULATORY_CARE_PROVIDER_SITE_OTHER): Payer: BLUE CROSS/BLUE SHIELD | Admitting: Student in an Organized Health Care Education/Training Program

## 2016-10-28 ENCOUNTER — Other Ambulatory Visit: Payer: Self-pay | Admitting: Student in an Organized Health Care Education/Training Program

## 2016-10-28 ENCOUNTER — Encounter: Payer: Self-pay | Admitting: Student in an Organized Health Care Education/Training Program

## 2016-10-28 ENCOUNTER — Ambulatory Visit (HOSPITAL_COMMUNITY)
Admission: RE | Admit: 2016-10-28 | Discharge: 2016-10-28 | Disposition: A | Discharge status: Home / Self Care | Payer: BLUE CROSS/BLUE SHIELD | Source: Ambulatory Visit | Attending: Family Medicine | Admitting: Family Medicine

## 2016-10-28 VITALS — BP 110/60 | HR 69 | Temp 97.5°F | Wt 179.0 lb

## 2016-10-28 DIAGNOSIS — R079 Chest pain, unspecified: Secondary | ICD-10-CM

## 2016-10-28 DIAGNOSIS — R0789 Other chest pain: Secondary | ICD-10-CM | POA: Insufficient documentation

## 2016-10-28 MED ORDER — NITROGLYCERIN 0.4 MG SL SUBL
0.4000 mg | SUBLINGUAL_TABLET | Freq: Once | SUBLINGUAL | Status: AC
  Administered 2016-10-28: 0.4 mg via SUBLINGUAL

## 2016-10-28 MED ORDER — NITROGLYCERIN 0.4 MG SL SUBL
0.4000 mg | SUBLINGUAL_TABLET | SUBLINGUAL | 3 refills | Status: DC | PRN
Start: 2016-10-28 — End: 2017-05-06

## 2016-10-28 MED ORDER — GI COCKTAIL ~~LOC~~: 30.0000 mL | Freq: Once | ORAL | Status: DC

## 2016-10-28 MED ORDER — NITROGLYCERIN 0.4 MG SL SUBL: 0.4000 mg | SUBLINGUAL_TABLET | SUBLINGUAL | 3 refills | Status: DC | PRN

## 2016-10-28 MED ORDER — CALCIUM CARBONATE ANTACID 400 MG PO CHEW
400.0000 mg | CHEWABLE_TABLET | Freq: Four times a day (QID) | ORAL | 0 refills | Status: DC | PRN
Start: 2016-10-28 — End: 2016-10-28

## 2016-10-28 MED ORDER — CALCIUM CARBONATE ANTACID 400 MG PO CHEW: 400.0000 mg | CHEWABLE_TABLET | Freq: Four times a day (QID) | ORAL | 0 refills | Status: DC | PRN

## 2016-10-28 MED FILL — NITROGLYCERIN 0.4 MG TAB SL: 0.4 | 5 days supply | Qty: 25 | Fill #0

## 2016-10-28 NOTE — Assessment & Plan Note (Addendum)
-   Considered Cardiac etiology, however less likely given:  - EKG in office NSR  - Heart score 3 (no troponin available) :  scored 1 point for age and 2 points for risk  factors which puts him at 1.7% risk  - given nitroglycerin in the office without  improvement in symptoms - Low likelihood for PE given normal HR and O2 sat, no LE edema, no dyspnea while sitting in the office - Low likelihood for aortic dissection given how nontoxic-appearing patient is on exam - office is out of GI cocktail however did prescribe maalox, gave nitroglycerin to be used PRN - return precautions provided - cardiology consult placed to be seen in next 48-72 hours

## 2016-10-28 NOTE — Patient Instructions (Addendum)
A consult was placed to cardiology at today's visit.  You will receive a call to schedule an appointment. If you do not receive a call within two weeks please call our office so we can place the consult again.   I sent a prescription for an antacid as well as nitroglycerin to your pharmacy. If you notice acute, significantly worsened chest pain, please take nitroglycerin and if the pain has not resolved in 5 minutes please proceed to the nearest ED to be evaluated.

## 2016-10-28 NOTE — Progress Notes (Signed)
CC: Chest pain  HPI: [name] is a 56 y.o. male with PMH significant for HLD, HTN, IBS who presents to [facility] today as a same day appointment with chest pain of 4 days duration.   Chest pain  Patient was in usual state of health until 3-4 days ago when he developed left-sided 5/10 chest pain that radiates to his back.  - The pain has been constant and worsened over last 4 days, prompting him to come in to the clinic to be evaluated. Pain is worsened with activity (though constant) and improves with pressing on his chest.  - Pain is not positional. It may be worse with eating but he is unable to say for sure with history of IBS symptoms.  - Associated symptoms include left hand and finger weakness and dyspnea, decreased exercise tolerance, mild sore throat and congestion without sinus pain, no cough or rhinorrhea. - No diaphoresis.   - No fevers, no N/V/D/C, no sick contacts.  - He does endorse a 33 year smoking history (quit 7-10 years ago) and his father died in his 71s of a heart attack.   Review of Symptoms:  See HPI for ROS.   CC, SH/smoking status, and VS noted.  Objective: BP 110/60   Pulse 69   Temp (!) 97.5 F (36.4 C) (Oral)   Wt 179 lb (81.2 kg)   SpO2 97%   BMI 28.89 kg/m  GEN: NAD, alert, cooperative, and pleasant, overall non-toxic appearing EYE: no conjunctival injection, pupils equally round and reactive to light ENMT: normal tympanic light reflex, no nasal polyps,no rhinorrhea, + mild pharyngeal erythema without exudates, no sinus tenderness NECK: full ROM, no thyromegaly, no LAD RESPIRATORY: clear to auscultation bilaterally with no wheezes, rhonchi or rales, good effort CV: RRR, no m/r/g, LE edema, no chest wall tenderness (pain feels better with palpation) GI: +mild LUQ and RUQ tenderness with deep palpation, negative murphy sign, soft, non-distended, no hepatosplenomegaly SKIN: warm and dry, no rashes or lesions NEURO: II-XII grossly intact, normal  gait, peripheral sensation intact, strength equal bil PSYCH: AAOx3, appropriate affect  EKG NSR  Assessment and plan:  Chest pain - Considered Cardiac etiology, however less likely given:  - EKG in office NSR  - Heart score 3 (no troponin available) :  scored 1 point for age and 2 points for risk  factors which puts him at 1.7% risk  - given nitroglycerin in the office without  improvement in symptoms - Low likelihood for PE given normal HR and O2 sat, no LE edema, no dyspnea while sitting in the office - Low likelihood for aortic dissection given how nontoxic-appearing patient is on exam - office is out of GI cocktail however did prescribe maalox, gave nitroglycerin to be used PRN - return precautions provided - cardiology consult placed to be seen in next 48-72 hours   Orders Placed This Encounter  Procedures  . Ambulatory referral to Cardiology    Referral Priority:   Urgent    Referral Type:   Consultation    Referral Reason:   Specialty Services Required    Requested Specialty:   Cardiology    Number of Visits Requested:   1  . EKG 12-Lead    Meds ordered this encounter  Medications  . gi cocktail (Maalox,Lidocaine,Donnatal)  . nitroGLYCERIN (NITROSTAT) SL tablet 0.4 mg  . Calcium Carbonate Antacid 400 MG CHEW    Sig: Chew 1 tablet (400 mg total) by mouth every 6 (six) hours as needed (  stomach pain).    Dispense:  90 each    Refill:  0  . nitroGLYCERIN (NITROSTAT) 0.4 MG SL tablet    Sig: Place 1 tablet (0.4 mg total) under the tongue every 5 (five) minutes as needed for chest pain.    Dispense:  30 tablet    Refill:  3   Discussed this case with Dr. McDiarmid.  Everrett Coombe, MD,MS,  PGY2 10/28/2016 3:37 PM

## 2016-11-12 ENCOUNTER — Ambulatory Visit (INDEPENDENT_AMBULATORY_CARE_PROVIDER_SITE_OTHER): Payer: BLUE CROSS/BLUE SHIELD | Admitting: Cardiology

## 2016-11-12 ENCOUNTER — Encounter: Payer: Self-pay | Admitting: Cardiology

## 2016-11-12 VITALS — BP 140/78 | HR 64 | Resp 10 | Ht 66.0 in | Wt 179.4 lb

## 2016-11-12 DIAGNOSIS — I10 Essential (primary) hypertension: Secondary | ICD-10-CM

## 2016-11-12 DIAGNOSIS — R5383 Other fatigue: Secondary | ICD-10-CM | POA: Diagnosis not present

## 2016-11-12 DIAGNOSIS — E786 Lipoprotein deficiency: Secondary | ICD-10-CM | POA: Diagnosis not present

## 2016-11-12 NOTE — Progress Notes (Signed)
Cardiology Consultation:    Date:  11/12/2016   ID:  JMARION CHRISTIANO, DOB 1960/04/12, MRN [id number]  PCP:  Lind Covert, MD  Cardiologist:  Jenne Campus, MD   Referring MD: Everrett Coombe, MD   Chief Complaint  Patient presents with  . Chest Pain  I'm having chest pain  History of Present Illness:    [name] is a 56 y.o. male who is being seen today for the evaluation of Chest pain at the request of Everrett Coombe, MD. Patient is a 56 years old gentleman who use to exercise in the medical basis. He used to run 4 miles every single day until he started having some problem with his feet.. Because of that for last 3 years he is not being exercising. However recently he decided to go on stationary bike. When he started doing that he very quickly becomes short of breath type exhausted and simply cannot perform. He is very concerned about at the top of that he described to have some pain on the left side of his chest. Pain is very atypical. Pain is there all the time. Sometimes he will have more pain which will be sharp and stabbing like he's pinpointing lower portion of the sternum for location of the pain. There is no aggravating or relieving factor except for palpitations. She takes a few deep breaths when he had the sensation pain gets better. It is not related to exercise. There is no swelling no shortness of breath a cesarean sensation. He said he has the sensation for years but lately become slightly worse. His risk factors for coronary artery disease include hypertension, dyslipidemia, family history of premature coronary artery disease (father died supple and he was 49.  Past Medical History:  Diagnosis Date  . Abdominal pain 2009   CT abd pelvis   . RECTAL PAIN 12/08/2007   Chronic - had evaluation by Dr Sharlett Iles in 2009 including EGD, colonscopy, biopsy and abdomen CT.   Repeat abdomen CT in 06/2010 also negative       Past Surgical History:  Procedure Laterality  Date  . CIRCUMCISION     Foreskin still partially attached to glans  . COLONOSCOPY  2009   [facility]  . HEMORRHOID SURGERY  2007    [facility]  . UPPER GASTROINTESTINAL ENDOSCOPY  2009   [facility]    Current Medications: Current Meds  Medication Sig  . aspirin 81 MG EC tablet Take 81 mg by mouth daily.    . Calcium Carbonate Antacid 400 MG CHEW Chew 1 tablet (400 mg total) by mouth every 6 (six) hours as needed (stomach pain).  . finasteride (PROPECIA) 1 MG tablet Take 1 mg by mouth daily.  Marland Kitchen losartan (COZAAR) 25 MG tablet TAKE 1 TABLET BY MOUTH DAILY.  . nitroGLYCERIN (NITROSTAT) 0.4 MG SL tablet Place 1 tablet (0.4 mg total) under the tongue every 5 (five) minutes as needed for chest pain.     Allergies:   Patient has no known allergies.   Social History   Social History  . Marital status: Married    Spouse name: N/A  . Number of children: N/A  . Years of education: N/A   Social History Main Topics  . Smoking status: Former Smoker    Packs/day: 2.00    Years: 28.00    Types: Cigarettes  . Smokeless tobacco: Never Used     Comment: smoked for 28 yr- smoked up to 2-3ppd. quit smoking 2yr ago  .  Alcohol use No  . Drug use: No  . Sexual activity: Yes    Partners: Female   Other Topics Concern  . None   Social History Narrative   Born [place];   Brother is a physician in middle [place]   Has several children near by   Works as a Geophysicist/field seismologist   No other real hobbies    April 2018     Family History: The patient's family history includes Diabetes in his brother and mother; Hypertension in his mother; Sudden death in his father. ROS:   Please see the history of present illness.    All 14 point review of systems negative except as described per history of present illness.  EKGs/Labs/Other Studies Reviewed:    The following studies were reviewed today: EKG showing normal sinus rhythm, normal. Interval, normal. Discomfort exertion., No ST-T segment  changes.    Recent Labs: 07/24/2016: ALT 24; BUN 14; Creatinine, Ser 0.84; Hemoglobin 14.4; Platelets 298; Potassium 4.2; Sodium 142; TSH 1.150  Recent Lipid Panel    Component Value Date/Time   CHOL 182 07/19/2013 1012   TRIG 130 07/19/2013 1012   HDL 37 (L) 07/19/2013 1012   CHOLHDL 4.9 07/19/2013 1012   VLDL 26 07/19/2013 1012   LDLCALC 119 (H) 07/19/2013 1012    Physical Exam:    VS:  BP 140/78   Pulse 64   Resp 10   Ht 5\' 6"  (1.676 m)   Wt 179 lb 6.4 oz (81.4 kg)   BMI 28.96 kg/m     Wt Readings from Last 3 Encounters:  11/12/16 179 lb 6.4 oz (81.4 kg)  10/28/16 179 lb (81.2 kg)  08/21/16 185 lb 3.2 oz (84 kg)     GEN:  Well nourished, well developed in no acute distress HEENT: Normal NECK: No JVD; No carotid bruits LYMPHATICS: No lymphadenopathy CARDIAC: RRR, no murmurs, no rubs, no gallops RESPIRATORY:  Clear to auscultation without rales, wheezing or rhonchi  ABDOMEN: Soft, non-tender, non-distended MUSCULOSKELETAL:  No edema; No deformity  SKIN: Warm and dry NEUROLOGIC:  Alert and oriented x 3 PSYCHIATRIC:  Normal affect   ASSESSMENT:    1. HYPERTENSION, BENIGN ESSENTIAL   2. Fatigue, unspecified type   3. Low HDL (under 40)    PLAN:    In order of problems listed above:  1. Atypical chest pain: He does have some risk factors for coronary artery disease, namely history of smoking, family history of premature coronary artery disease and hypertension. I will ask him to have a plain EKG treadmill stress test to rule out potential ischemia. Overall however the low-level suspicion. He is already on aspirin and I will continue. 2. Fatigue: I suspect related to deconditioning. But again stressed this will be done to check his ability to exercise as well as to rule out potential ischemia. 3. Dyslipidemia: Last fasting lipid profile done 3 years ago we'll repeat the test. 4. Essential hypertension: Medications appropriate blood pressure high normal today but  this is first visit in the office we'll reevaluate again when he be back here in about a month. 5.    Medication Adjustments/Labs and Tests Ordered: Current medicines are reviewed at length with the patient today.  Concerns regarding medicines are outlined above.  No orders of the defined types were placed in this encounter.  No orders of the defined types were placed in this encounter.   Signed, Park Liter, MD, [facility]. 11/12/2016 9:33 AM    [facility] 

## 2016-11-12 NOTE — Patient Instructions (Signed)
Medication Instructions:  Your physician recommends that you continue on your current medications as directed. Please refer to the Current Medication list given to you today.  Labwork: Your physician recommends that you have a lipid profile drawn today to check your cholesterol.   Testing/Procedures: Your physician has requested that you have an exercise tolerance test. For further information please visit [email or web address]. Please also follow instruction sheet, as given.  Please report to [address], [place] the day of your test.    Follow-Up: Your physician recommends that you schedule a follow-up appointment in: 1 month   Any Other Special Instructions Will Be Listed Below (If Applicable).     If you need a refill on your cardiac medications before your next appointment, please call your pharmacy.

## 2016-11-13 LAB — LIPID PANEL
Chol/HDL Ratio: 4.5 ratio (ref 0.0–5.0)
Cholesterol, Total: 172 mg/dL (ref 100–199)
HDL: 38 mg/dL — ABNORMAL LOW (ref >39)
LDL CALC: 91 mg/dL (ref 0–99)
TRIGLYCERIDES: 215 mg/dL — AB (ref 0–149)
VLDL CHOLESTEROL CAL: 43 mg/dL — AB (ref 5–40)

## 2016-11-18 ENCOUNTER — Other Ambulatory Visit: Payer: Self-pay | Admitting: Family Medicine

## 2016-11-18 MED FILL — LOSARTAN POTASSIUM 25 MG TA: 25 | 90 days supply | Qty: 90 | Fill #0

## 2016-11-21 ENCOUNTER — Ambulatory Visit (INDEPENDENT_AMBULATORY_CARE_PROVIDER_SITE_OTHER): Payer: BLUE CROSS/BLUE SHIELD

## 2016-11-21 DIAGNOSIS — I10 Essential (primary) hypertension: Secondary | ICD-10-CM | POA: Diagnosis not present

## 2016-11-21 DIAGNOSIS — R5383 Other fatigue: Secondary | ICD-10-CM | POA: Diagnosis not present

## 2016-11-21 LAB — EXERCISE TOLERANCE TEST
CHL RATE OF PERCEIVED EXERTION: 14
CSEPED: 12 min
CSEPEDS: 0 s
CSEPEW: 13.7 METS
CSEPHR: 98 %
MPHR: 164 {beats}/min
Peak HR: 162 {beats}/min
Rest HR: 76 {beats}/min

## 2016-12-13 ENCOUNTER — Ambulatory Visit: Payer: BLUE CROSS/BLUE SHIELD | Admitting: Cardiology

## 2016-12-13 ENCOUNTER — Ambulatory Visit (INDEPENDENT_AMBULATORY_CARE_PROVIDER_SITE_OTHER): Payer: BLUE CROSS/BLUE SHIELD | Admitting: Cardiology

## 2016-12-13 ENCOUNTER — Encounter: Payer: Self-pay | Admitting: Cardiology

## 2016-12-13 VITALS — BP 132/78 | HR 60 | Resp 14 | Ht 66.0 in | Wt 182.0 lb

## 2016-12-13 DIAGNOSIS — R0789 Other chest pain: Secondary | ICD-10-CM

## 2016-12-13 DIAGNOSIS — R5383 Other fatigue: Secondary | ICD-10-CM | POA: Diagnosis not present

## 2016-12-13 DIAGNOSIS — I10 Essential (primary) hypertension: Secondary | ICD-10-CM | POA: Diagnosis not present

## 2016-12-13 NOTE — Progress Notes (Signed)
Cardiology Office Note:    Date:  12/13/2016   ID:  [name], DOB [birth year], MRN [id number]  PCP:  Lind Covert, MD  Cardiologist:  Jenne Campus, MD    Referring MD: Lind Covert, *   Chief Complaint  Patient presents with  . 1 month follow up  He is doing well  History of Present Illness:    [name] is a 56 y.o. male  with risk factors for coronary artery disease, he presented initially to me with some atypical chest pain. He is doing well denies having any chest pain tightness squeezing pressure burning chest but he admitted that he did not exercising she see me last time. Luckily stress to show no evidence of ischemia he walked 9 minutes on the treadmill without ST segment changes and pain. I also checked his cholesterol which looks fine the only issue was his triglyceride being high. We had a long discussion today about what to do in this situation and I strongly advised him to start exercising on a regular basis. Talk about stretching we talked about aerobic exercises which are most important from cardiac standpoint of view. I will see him back in my office in about 3 months to see how he does. He did have slightly hypertensive response while exercising on the treadmill but I'm convinced with good diet and we talked about low salt diet as well as exercises on the radial basis will be able to control his blood pressure with current dosages of medications.  Past Medical History:  Diagnosis Date  . Abdominal pain 2009   CT abd pelvis   . RECTAL PAIN 12/08/2007   Chronic - had evaluation by Dr Sharlett Iles in 2009 including EGD, colonscopy, biopsy and abdomen CT.   Repeat abdomen CT in 06/2010 also negative       Past Surgical History:  Procedure Laterality Date  . CIRCUMCISION     Foreskin still partially attached to glans  . COLONOSCOPY  2009   [facility]  . HEMORRHOID SURGERY  2007    [facility]  . UPPER GASTROINTESTINAL ENDOSCOPY  2009   [facility]    Current Medications: Current Meds  Medication Sig  . aspirin 81 MG EC tablet Take 81 mg by mouth daily.    . Calcium Carbonate Antacid 400 MG CHEW Chew 1 tablet (400 mg total) by mouth every 6 (six) hours as needed (stomach pain).  . finasteride (PROPECIA) 1 MG tablet Take 1 mg by mouth daily.  Marland Kitchen losartan (COZAAR) 25 MG tablet TAKE 1 TABLET BY MOUTH DAILY.  . nitroGLYCERIN (NITROSTAT) 0.4 MG SL tablet Place 1 tablet (0.4 mg total) under the tongue every 5 (five) minutes as needed for chest pain.     Allergies:   Patient has no known allergies.   Social History   Social History  . Marital status: Married    Spouse name: N/A  . Number of children: N/A  . Years of education: N/A   Social History Main Topics  . Smoking status: Former Smoker    Packs/day: 2.00    Years: 28.00    Types: Cigarettes  . Smokeless tobacco: Never Used     Comment: smoked for 28 yr- smoked up to 2-3ppd. quit smoking 65yr ago  . Alcohol use No  . Drug use: No  . Sexual activity: Yes    Partners: Female   Other Topics Concern  . None   Social History Narrative   Born [place];  Brother is a physician in middle [place]   Has several children near by   Works as a Geophysicist/field seismologist   No other real hobbies    April 2018     Family History: The patient's family history includes Diabetes in his brother and mother; Hypertension in his mother; Sudden death in his father. ROS:   Please see the history of present illness.    All 14 point review of systems negative except as described per history of present illness  EKGs/Labs/Other Studies Reviewed:      Recent Labs: 07/24/2016: ALT 24; BUN 14; Creatinine, Ser 0.84; Hemoglobin 14.4; Platelets 298; Potassium 4.2; Sodium 142; TSH 1.150  Recent Lipid Panel    Component Value Date/Time   CHOL 172 11/12/2016 0952   TRIG 215 (H) 11/12/2016 0952   HDL 38 (L) 11/12/2016 0952   CHOLHDL 4.5 11/12/2016 0952   CHOLHDL 4.9 07/19/2013 1012   VLDL 26  07/19/2013 1012   LDLCALC 91 11/12/2016 0952    Physical Exam:    VS:  BP 132/78   Pulse 60   Resp 14   Ht 5\' 6"  (1.676 m)   Wt 182 lb (82.6 kg)   BMI 29.38 kg/m     Wt Readings from Last 3 Encounters:  12/13/16 182 lb (82.6 kg)  11/12/16 179 lb 6.4 oz (81.4 kg)  10/28/16 179 lb (81.2 kg)     GEN:  Well nourished, well developed in no acute distress HEENT: Normal NECK: No JVD; No carotid bruits LYMPHATICS: No lymphadenopathy CARDIAC: RRR, no murmurs, no rubs, no gallops RESPIRATORY:  Clear to auscultation without rales, wheezing or rhonchi  ABDOMEN: Soft, non-tender, non-distended MUSCULOSKELETAL:  No edema; No deformity  SKIN: Warm and dry LOWER EXTREMITIES: no swelling NEUROLOGIC:  Alert and oriented x 3 PSYCHIATRIC:  Normal affect   ASSESSMENT:    1. HYPERTENSION, BENIGN ESSENTIAL   2. Atypical chest pain   3. Other fatigue    PLAN:    In order of problems listed above:  1. Hypertension: Blood pressure appears to be fairly controlled but I'm hopeful improvement with exercises on the radial basis. 2. Atypical chest pain: Denies having any. 3. Fatigue. I suspect it's deconditioning. I encouraged him to exercise on the radial basis.   Medication Adjustments/Labs and Tests Ordered: Current medicines are reviewed at length with the patient today.  Concerns regarding medicines are outlined above.  No orders of the defined types were placed in this encounter.  Medication changes: No orders of the defined types were placed in this encounter.   Signed, Park Liter, MD, [facility] 12/13/2016 8:59 AM    [place]

## 2016-12-13 NOTE — Patient Instructions (Signed)
Medication Instructions:  Your physician recommends that you continue on your current medications as directed. Please refer to the Current Medication list given to you today.  Labwork: None   Testing/Procedures: None   Follow-Up: Your physician recommends that you schedule a follow-up appointment in: 3-4 months   Any Other Special Instructions Will Be Listed Below (If Applicable).  Please note that any paperwork needing to be filled out by the provider will need to be addressed at the front desk prior to seeing the provider. Please note that any paperwork FMLA, Disability or other documents regarding health condition is subject to a $25.00 charge that must be received prior to completion of paperwork in the form of a money order or check.     If you need a refill on your cardiac medications before your next appointment, please call your pharmacy.

## 2017-01-26 NOTE — Progress Notes (Signed)
   Subjective:   Patient ID: [name]    DOB: 12-Dec-1960, 56 y.o. male   MRN: [id number]  [name] is a 56 y.o. male with a history of HTN, neuropathy here for   Finger pain - 15 day h/o of L hand weakness, stiff MCP and PIP joints that are initially painful to move. States weakness inhibits him from holding a coffee cup. States stiffness improves as the day progresses. - Endorses a h/o "dry and stiff" joints diffusely, mainly in hands and knees that is worse at the end of the day - Has tried aleve with relief, and nyquil at night to help him sleep. Has not tried heating pad/ice packs. - Denies fevers, N/V, night sweats, weight loss - Previously seen orthopedics and PT for neck pain  Review of Systems:  Per HPI.   [place]: reviewed. Smoking status reviewed. Medications reviewed.  Objective:   BP 132/70   Pulse 69   Temp 97.9 F (36.6 C) (Oral)   Ht 5\' 6"  (1.676 m)   Wt 184 lb 9.6 oz (83.7 kg)   SpO2 98%   BMI 29.80 kg/m  Vitals and nursing note reviewed.  General: well nourished, well developed, in no acute distress with non-toxic appearance CV: regular rate and rhythm without murmurs, rubs, or gallops Lungs: clear to auscultation bilaterally with normal work of breathing Skin: warm, dry. Vitiligo hypopigmentation intermittently dispersed on bilateral forearms Extremities: warm and well perfused, normal tone MSK: ROM grossly intact, 5/5 U/LE strength, gait normal Neuro: Alert and oriented, speech normal  Assessment & Plan:   Joint stiffness Chronic history of diffuse joint stiffness. Currently c/o L hand joint stiffness with pain with initial movement. Most likely arthritic in etiology. Can consider carpal tunnel since also having pain with grip. Will try Mobic for 2 weeks. If no relief, can consider rheum labs, xrays, or further carpal tunnel workup including EMG/surgical referral. - Mobic 7.5mg  daily - Follow up in 2 weeks.  Orders Placed This Encounter    Procedures  . Flu Vaccine QUAD 36+ mos IM   Meds ordered this encounter  Medications  . meloxicam (MOBIC) 7.5 MG tablet    Sig: Take 1 tablet (7.5 mg total) by mouth daily.    Dispense:  30 tablet    Refill:  0   Justin Percy, DO PGY-1, [facility] 01/27/2017 9:35 AM

## 2017-01-27 ENCOUNTER — Ambulatory Visit (INDEPENDENT_AMBULATORY_CARE_PROVIDER_SITE_OTHER): Payer: BLUE CROSS/BLUE SHIELD | Admitting: Family Medicine

## 2017-01-27 ENCOUNTER — Encounter: Payer: Self-pay | Admitting: Family Medicine

## 2017-01-27 VITALS — BP 132/70 | HR 69 | Temp 97.9°F | Ht 66.0 in | Wt 184.6 lb

## 2017-01-27 DIAGNOSIS — M2560 Stiffness of unspecified joint, not elsewhere classified: Secondary | ICD-10-CM

## 2017-01-27 DIAGNOSIS — Z23 Encounter for immunization: Secondary | ICD-10-CM

## 2017-01-27 DIAGNOSIS — M65322 Trigger finger, left index finger: Secondary | ICD-10-CM | POA: Insufficient documentation

## 2017-01-27 DIAGNOSIS — M6530 Trigger finger, unspecified finger: Secondary | ICD-10-CM | POA: Insufficient documentation

## 2017-01-27 MED ORDER — MELOXICAM 7.5 MG PO TABS: 7.5000 mg | ORAL_TABLET | Freq: Every day | ORAL | 0 refills | Status: DC

## 2017-01-27 MED FILL — MELOXICAM 7.5 MG TABLET: 7.5 | 30 days supply | Qty: 30 | Fill #0

## 2017-01-27 NOTE — Assessment & Plan Note (Signed)
Chronic history of diffuse joint stiffness. Currently c/o L hand joint stiffness with pain with initial movement. Most likely arthritic in etiology. Can consider carpal tunnel since also having pain with grip. Will try Mobic for 2 weeks. If no relief, can consider rheum labs, xrays, or further carpal tunnel workup including EMG/surgical referral. - Mobic 7.5mg  daily - Follow up in 2 weeks.

## 2017-01-27 NOTE — Patient Instructions (Signed)
It was great to see you!  For your joint stiffness and pain,  - We are prescribing an anti-inflammatory medication called Mobic. Take this every day to reduce inflammation. - Follow up in 2-3 weeks.  Take care and seek immediate care sooner if you develop any concerns.   Justin Percy, DO [facility]   Arthritis Arthritis is a term that is commonly used to refer to joint pain or joint disease. There are more than 100 types of arthritis. What are the causes? The most common cause of this condition is wear and tear of a joint. Other causes include:  Gout.  Inflammation of a joint.  An infection of a joint.  Sprains and other injuries near the joint.  A drug reaction or allergic reaction.  In some cases, the cause may not be known. What are the signs or symptoms? The main symptom of this condition is pain in the joint with movement. Other symptoms include:  Redness, swelling, or stiffness at a joint.  Warmth coming from the joint.  Fever.  Overall feeling of illness.  How is this diagnosed? This condition may be diagnosed with a physical exam and tests, including:  Blood tests.  Urine tests.  Imaging tests, such as MRI, X-rays, or a CT scan.  Sometimes, fluid is removed from a joint for testing. How is this treated? Treatment for this condition may involve:  Treatment of the cause, if it is known.  Rest.  Raising (elevating) the joint.  Applying cold or hot packs to the joint.  Medicines to improve symptoms and reduce inflammation.  Injections of a steroid such as cortisone into the joint to help reduce pain and inflammation.  Depending on the cause of your arthritis, you may need to make lifestyle changes to reduce stress on your joint. These changes may include exercising more and losing weight. Follow these instructions at home: Medicines  Take over-the-counter and prescription medicines only as told by your health care provider.  Do not  take aspirin to relieve pain if gout is suspected. Activity  Rest your joint if told by your health care provider. Rest is important when your disease is active and your joint feels painful, swollen, or stiff.  Avoid activities that make the pain worse. It is important to balance activity with rest.  Exercise your joint regularly with range-of-motion exercises as told by your health care provider. Try doing low-impact exercise, such as: ? Swimming. ? Water aerobics. ? Biking. ? Walking. Joint Care   If your joint is swollen, keep it elevated if told by your health care provider.  If your joint feels stiff in the morning, try taking a warm shower.  If directed, apply heat to the joint. If you have diabetes, do not apply heat without permission from your health care provider. ? Put a towel between the joint and the hot pack or heating pad. ? Leave the heat on the area for 20-30 minutes.  If directed, apply ice to the joint: ? Put ice in a plastic bag. ? Place a towel between your skin and the bag. ? Leave the ice on for 20 minutes, 2-3 times per day.  Keep all follow-up visits as told by your health care provider. This is important. Contact a health care provider if:  The pain gets worse.  You have a fever. Get help right away if:  You develop severe joint pain, swelling, or redness.  Many joints become painful and swollen.  You develop severe  back pain.  You develop severe weakness in your leg.  You cannot control your bladder or bowels. This information is not intended to replace advice given to you by your health care provider. Make sure you discuss any questions you have with your health care provider. Document Released: 05/02/2004 Document Revised: 08/31/2015 Document Reviewed: 06/20/2014 Elsevier Interactive Patient Education  [employer].

## 2017-02-18 MED FILL — LOSARTAN POTASSIUM 25 MG TA: 25 | 90 days supply | Qty: 90 | Fill #1

## 2017-02-26 ENCOUNTER — Ambulatory Visit: Payer: BLUE CROSS/BLUE SHIELD | Admitting: Family Medicine

## 2017-02-26 ENCOUNTER — Other Ambulatory Visit: Payer: Self-pay

## 2017-02-26 ENCOUNTER — Encounter: Payer: Self-pay | Admitting: Family Medicine

## 2017-02-26 DIAGNOSIS — M6530 Trigger finger, unspecified finger: Secondary | ICD-10-CM

## 2017-02-26 NOTE — Assessment & Plan Note (Signed)
Classic trigger fingers on left index and long fingers.  He has tried conservative therapy and does not engage in any exacerbating activities.  Will refer to hand surgery

## 2017-02-26 NOTE — Patient Instructions (Addendum)
Good to see you today!  Thanks for coming in.  You have trigger fingers on your left hand.     I will refer you to a hand surgeon  They should contact you in the next few weeks  Nasal saline - use every evening before bed  Also can try a humidifier

## 2017-02-26 NOTE — Progress Notes (Signed)
Subjective  Patient is presenting with the following illnesses  Left hand Pain and Cathing Notices his left index and long fingers clench up and are painful and has to manually extend especially worse at night.  No specific trauma or overuse.  No similar symptoms in other joints.  No edema or rash or fevers or weight loss.  Does not go numb at night   Chief Complaint noted Review of Symptoms - see HPI PMH - Smoking status noted.     Objective Vital Signs reviewed Left index and long fingers catch when flex and has tender mcp joints without soft tissue swelling or redness FROM without symptoms in right hand, wrists and elbows. Phalens negative    Assessments/Plans  No problem-specific Assessment & Plan notes found for this encounter.   See after visit summary for details of patient instuctions

## 2017-03-14 ENCOUNTER — Ambulatory Visit: Payer: BLUE CROSS/BLUE SHIELD | Admitting: Cardiology

## 2017-03-17 ENCOUNTER — Ambulatory Visit: Payer: BLUE CROSS/BLUE SHIELD | Admitting: Cardiology

## 2017-05-06 ENCOUNTER — Encounter: Payer: Self-pay | Admitting: Cardiology

## 2017-05-06 ENCOUNTER — Ambulatory Visit: Payer: BLUE CROSS/BLUE SHIELD | Admitting: Cardiology

## 2017-05-06 VITALS — BP 122/88 | HR 77 | Ht 66.0 in | Wt 190.0 lb

## 2017-05-06 DIAGNOSIS — R0789 Other chest pain: Secondary | ICD-10-CM | POA: Diagnosis not present

## 2017-05-06 DIAGNOSIS — I10 Essential (primary) hypertension: Secondary | ICD-10-CM

## 2017-05-06 MED ORDER — NITROGLYCERIN 0.4 MG SL SUBL: 0.4000 mg | SUBLINGUAL_TABLET | SUBLINGUAL | 3 refills | Status: DC | PRN

## 2017-05-06 MED FILL — NITROGLYCERIN 0.4 MG TAB SL: 0.4 | 30 days supply | Qty: 25 | Fill #0

## 2017-05-06 NOTE — Progress Notes (Signed)
Cardiology Office Note:    Date:  05/06/2017   ID:  [name], DOB 1960/04/30, MRN [id number]  PCP:  Lind Covert, MD  Cardiologist:  Jenne Campus, MD    Referring MD: Lind Covert, *   Chief Complaint  Patient presents with  . Follow-up  Doing well but did not exercise  History of Present Illness:    [name] is a 57 y.o. male with hypertension dyslipidemia atypical chest pain.  I did stress test on him which was perfectly normal.  He comes today for 2 hours for follow-up.  Doing well described to have some atypical chest pain not related to exercise lasting only for brief seconds.  Stabbing, pinches light.  We were talking last time about exercises and he promised to start it however he did not he said he is too busy he is too stressed out I spent Gradle of time talking to him and try to explain to him how critical is to exercise on the regular basis and how beneficial that would be for him.  I hope I was able to convince him and I hope he will initiate exercise program.  Does have all equipment at home therefore it should be fairly easy for him to start it.  Past Medical History:  Diagnosis Date  . Abdominal pain 2009   CT abd pelvis   . RECTAL PAIN 12/08/2007   Chronic - had evaluation by Dr Sharlett Iles in 2009 including EGD, colonscopy, biopsy and abdomen CT.   Repeat abdomen CT in 06/2010 also negative       Past Surgical History:  Procedure Laterality Date  . CIRCUMCISION     Foreskin still partially attached to glans  . COLONOSCOPY  2009   [facility]  . HEMORRHOID SURGERY  2007    [facility]  . UPPER GASTROINTESTINAL ENDOSCOPY  2009   [facility]    Current Medications: Current Meds  Medication Sig  . aspirin 81 MG EC tablet Take 81 mg by mouth daily.    Marland Kitchen losartan (COZAAR) 25 MG tablet TAKE 1 TABLET BY MOUTH DAILY.  . nitroGLYCERIN (NITROSTAT) 0.4 MG SL tablet Place 1 tablet (0.4 mg total) under the tongue every 5 (five) minutes as  needed for chest pain.  . [DISCONTINUED] nitroGLYCERIN (NITROSTAT) 0.4 MG SL tablet Place 1 tablet (0.4 mg total) under the tongue every 5 (five) minutes as needed for chest pain.     Allergies:   Patient has no known allergies.   Social History   Socioeconomic History  . Marital status: Married    Spouse name: None  . Number of children: None  . Years of education: None  . Highest education level: None  Social Needs  . Financial resource strain: None  . Food insecurity - worry: None  . Food insecurity - inability: None  . Transportation needs - medical: None  . Transportation needs - non-medical: None  Occupational History  . None  Tobacco Use  . Smoking status: Former Smoker    Packs/day: 2.00    Years: 28.00    Pack years: 56.00    Types: Cigarettes  . Smokeless tobacco: Never Used  . Tobacco comment: smoked for 28 yr- smoked up to 2-3ppd. quit smoking 55yr ago  Substance and Sexual Activity  . Alcohol use: No  . Drug use: No  . Sexual activity: Yes    Partners: Female  Other Topics Concern  . None  Social History Narrative   [place]   Brother is a physician in middle [place]   Has several children near by   Works as a Geophysicist/field seismologist   No other real hobbies    April 2018     Family History: The patient's family history includes Diabetes in his brother and mother; Hypertension in his mother; Sudden death in his father. ROS:   Please see the history of present illness.    All 14 point review of systems negative except as described per history of present illness  EKGs/Labs/Other Studies Reviewed:      Recent Labs: 07/24/2016: ALT 24; BUN 14; Creatinine, Ser 0.84; Hemoglobin 14.4; Platelets 298; Potassium 4.2; Sodium 142; TSH 1.150  Recent Lipid Panel    Component Value Date/Time   CHOL 172 11/12/2016 0952   TRIG 215 (H) 11/12/2016 0952   HDL 38 (L) 11/12/2016 0952   CHOLHDL 4.5 11/12/2016 0952   CHOLHDL 4.9 07/19/2013 1012   VLDL 26 07/19/2013 1012    LDLCALC 91 11/12/2016 0952    Physical Exam:    VS:  BP 122/88 (BP Location: Left Arm, Patient Position: Sitting, Cuff Size: Normal)   Pulse 77   Ht 5\' 6"  (1.676 m)   Wt 190 lb (86.2 kg)   SpO2 95%   BMI 30.67 kg/m     Wt Readings from Last 3 Encounters:  05/06/17 190 lb (86.2 kg)  02/26/17 187 lb 9.6 oz (85.1 kg)  01/27/17 184 lb 9.6 oz (83.7 kg)     GEN:  Well nourished, well developed in no acute distress HEENT: Normal NECK: No JVD; No carotid bruits LYMPHATICS: No lymphadenopathy CARDIAC: RRR, no murmurs, no rubs, no gallops RESPIRATORY:  Clear to auscultation without rales, wheezing or rhonchi  ABDOMEN: Soft, non-tender, non-distended MUSCULOSKELETAL:  No edema; No deformity  SKIN: Warm and dry LOWER EXTREMITIES: no swelling NEUROLOGIC:  Alert and oriented x 3 PSYCHIATRIC:  Normal affect   ASSESSMENT:    1. HYPERTENSION, BENIGN ESSENTIAL   2. Atypical chest pain    PLAN:    In order of problems listed above:  1. Hypertension: Blood pressure appears to be well controlled continue present management 2. Atypical chest pain: Stress test negative we have talked about exercises and hopefully he will start exercise on the regular basis 3. Low HDL: Again it will be tremendously beneficial for him to exercise on the regular basis to improve his cholesterol profile.   Medication Adjustments/Labs and Tests Ordered: Current medicines are reviewed at length with the patient today.  Concerns regarding medicines are outlined above.  No orders of the defined types were placed in this encounter.  Medication changes:  Meds ordered this encounter  Medications  . nitroGLYCERIN (NITROSTAT) 0.4 MG SL tablet    Sig: Place 1 tablet (0.4 mg total) under the tongue every 5 (five) minutes as needed for chest pain.    Dispense:  30 tablet    Refill:  3    Signed, Park Liter, MD, [facility] 05/06/2017 8:53 AM    [place]

## 2017-05-06 NOTE — Patient Instructions (Signed)
Medication Instructions:  Your physician recommends that you continue on your current medications as directed. Please refer to the Current Medication list given to you today.  Labwork: None ordered  Testing/Procedures: None ordered  Follow-Up: Your physician recommends that you schedule a follow-up appointment in: 6 months   Any Other Special Instructions Will Be Listed Below (If Applicable).     If you need a refill on your cardiac medications before your next appointment, please call your pharmacy.

## 2017-05-07 ENCOUNTER — Other Ambulatory Visit: Payer: Self-pay | Admitting: Family Medicine

## 2017-05-19 MED FILL — LOSARTAN POTASSIUM 25 MG TA: 25 | 90 days supply | Qty: 90 | Fill #0

## 2017-07-08 ENCOUNTER — Ambulatory Visit: Payer: BLUE CROSS/BLUE SHIELD | Admitting: Family Medicine

## 2017-07-08 ENCOUNTER — Encounter: Payer: Self-pay | Admitting: Family Medicine

## 2017-07-08 ENCOUNTER — Other Ambulatory Visit: Payer: Self-pay

## 2017-07-08 VITALS — BP 136/82 | HR 75 | Temp 98.3°F | Ht 66.0 in | Wt 188.4 lb

## 2017-07-08 DIAGNOSIS — R109 Unspecified abdominal pain: Secondary | ICD-10-CM | POA: Diagnosis not present

## 2017-07-08 DIAGNOSIS — I10 Essential (primary) hypertension: Secondary | ICD-10-CM | POA: Diagnosis not present

## 2017-07-08 DIAGNOSIS — R1031 Right lower quadrant pain: Secondary | ICD-10-CM | POA: Insufficient documentation

## 2017-07-08 DIAGNOSIS — Z125 Encounter for screening for malignant neoplasm of prostate: Secondary | ICD-10-CM | POA: Diagnosis not present

## 2017-07-08 DIAGNOSIS — R1032 Left lower quadrant pain: Secondary | ICD-10-CM | POA: Insufficient documentation

## 2017-07-08 NOTE — Assessment & Plan Note (Signed)
Recurrent.  No red flags.  Mostly likely dyspepsia variant.  Will check labs.  No signs of acute abdomen or weight loss

## 2017-07-08 NOTE — Patient Instructions (Signed)
Good to see you today!  Thanks for coming in.  For the heel pain - continue to work on weight control.  Eat three times a day and smaller portions  For the abdomen pain - will check labs   I will call you if your lab tests are not normal.  Otherwise we will discuss them at your next visit.  Come back in one month.  Aim to lose about 5 lbs

## 2017-07-08 NOTE — Assessment & Plan Note (Signed)
BP Readings from Last 3 Encounters:  07/08/17 136/82  05/06/17 122/88  02/26/17 126/72   Is at goal. Check labs

## 2017-07-08 NOTE — Progress Notes (Signed)
Subjective  Patient is presenting with the following illnesses  ABDOMINAL PAIN  Pain began many days ago.  Hard to describe an achy pain in mid abdomen.  Is constant  Medications tried: none Similar pain before:yes in past comes and goes Prior abdominal surgeries: no  Symptoms Nausea/vomiting: no Diarrhea: no Constipation: no Blood in stool: no Blood in vomit: no Fever: no Dysuria: no Does have rectal pain - constant achy.  No change with bowel movement or urination Loss of appetite: no Weight loss: no  Review of Symptoms - see HPI PMH - Smoking status noted.      HYPERTENSION Disease Monitoring  Home BP Monitoring (Severity) not checking Symptoms - Chest pain- no    Dyspnea- no Medications (Modifying factors) Compliance-  Daily losartan. Lightheadedness-  no  Edema- no Timing - continuous  Duration - years ROS - See HPI  PMH Lab Review   Potassium  Date Value Ref Range Status  07/24/2016 4.2 3.5 - 5.2 mmol/L Final   Sodium  Date Value Ref Range Status  07/24/2016 142 134 - 144 mmol/L Final   Creat  Date Value Ref Range Status  06/09/2015 1.03 0.70 - 1.33 mg/dL Final   Creatinine, Ser  Date Value Ref Range Status  07/24/2016 0.84 0.76 - 1.27 mg/dL Final      Chief Complaint noted Review of Symptoms - see HPI PMH - Smoking status noted.     Objective Vital Signs reviewed Heart - Regular rate and rhythm.  No murmurs, gallops or rubs.    Lungs:  Normal respiratory effort, chest expands symmetrically. Lungs are clear to auscultation, no crackles or wheezes. Abdomen: soft and mostly non-tender without masses, organomegaly or hernias noted.  No guarding or rebound. On deep palpation a little discomfort in midline.  No deficits  Rectal: normal size firm prostate without masses or irregularities     Assessments/Plans  Abdominal pain Recurrent.  No red flags.  Mostly likely dyspepsia variant.  Will check labs.  No signs of acute abdomen or weight loss    HYPERTENSION, BENIGN ESSENTIAL BP Readings from Last 3 Encounters:  07/08/17 136/82  05/06/17 122/88  02/26/17 126/72   Is at goal. Check labs    See after visit summary for details of patient instuctions

## 2017-07-09 ENCOUNTER — Encounter: Payer: Self-pay | Admitting: Family Medicine

## 2017-07-09 LAB — CMP14+EGFR
ALT: 19 IU/L (ref 0–44)
AST: 16 IU/L (ref 0–40)
Albumin/Globulin Ratio: 1.8 (ref 1.2–2.2)
Albumin: 4.4 g/dL (ref 3.5–5.5)
Alkaline Phosphatase: 60 IU/L (ref 39–117)
BUN/Creatinine Ratio: 13 (ref 9–20)
BUN: 14 mg/dL (ref 6–24)
CHLORIDE: 104 mmol/L (ref 96–106)
CO2: 23 mmol/L (ref 20–29)
CREATININE: 1.09 mg/dL (ref 0.76–1.27)
Calcium: 9.6 mg/dL (ref 8.7–10.2)
GFR calc Af Amer: 87 mL/min/{1.73_m2} (ref >59)
GFR calc non Af Amer: 75 mL/min/{1.73_m2} (ref >59)
GLUCOSE: 95 mg/dL (ref 65–99)
Globulin, Total: 2.5 g/dL (ref 1.5–4.5)
Potassium: 4.5 mmol/L (ref 3.5–5.2)
SODIUM: 142 mmol/L (ref 134–144)
Total Protein: 6.9 g/dL (ref 6.0–8.5)

## 2017-07-09 LAB — PSA: Prostate Specific Ag, Serum: 1.4 ng/mL (ref 0.0–4.0)

## 2017-07-09 LAB — CBC
HEMATOCRIT: 45.5 % (ref 37.5–51.0)
Hemoglobin: 15 g/dL (ref 13.0–17.7)
MCH: 28.6 pg (ref 26.6–33.0)
MCHC: 33 g/dL (ref 31.5–35.7)
MCV: 87 fL (ref 79–97)
PLATELETS: 274 10*3/uL (ref 150–379)
RBC: 5.25 x10E6/uL (ref 4.14–5.80)
RDW: 14.5 % (ref 12.3–15.4)
WBC: 7.9 10*3/uL (ref 3.4–10.8)

## 2017-07-15 ENCOUNTER — Other Ambulatory Visit: Payer: Self-pay

## 2017-07-15 ENCOUNTER — Encounter: Payer: Self-pay | Admitting: Family Medicine

## 2017-07-15 ENCOUNTER — Ambulatory Visit (INDEPENDENT_AMBULATORY_CARE_PROVIDER_SITE_OTHER): Payer: BLUE CROSS/BLUE SHIELD | Admitting: Family Medicine

## 2017-07-15 DIAGNOSIS — R109 Unspecified abdominal pain: Secondary | ICD-10-CM | POA: Diagnosis not present

## 2017-07-15 DIAGNOSIS — G4485 Primary stabbing headache: Secondary | ICD-10-CM

## 2017-07-15 LAB — POCT SEDIMENTATION RATE: POCT SED RATE: 3 mm/hr (ref 0–22)

## 2017-07-15 NOTE — Assessment & Plan Note (Signed)
New I think this is more stress related with low probability for temporal arteritis since no temporal artery tenderness but given age and possible optometry concerns will check ESR. I have considered and concluded the patient has a very low likelihood of having a CNS bleed (subdural or subarachnoid): Infection (meningitis/encephalitis) or  Space occupying lesion (tumor or abscess):

## 2017-07-15 NOTE — Progress Notes (Signed)
Subjective  Patient is presenting with the following illnesses  HEADACHE For the last few weeks.  Mostly left sided.  Improves some with ibuprofen and tylenol.  Has chronic L sided mild weakness but not other weakness.  No vision loss.  Saw eye doctor last week who suggested urgent follow up to test for something he is not sure what.  No fever or tender areas around eye.  No rashes or swollen joints  ABDOMEN PAIN Has been worried about his headache and this feels better.  No nausea and vomiting    Chief Complaint noted Review of Symptoms - see HPI PMH - Smoking status noted.     Objective Vital Signs reviewed Alert nad worried Eye - Pupils Equal Round Reactive to light, Extraocular movements intact, Fundi without hemorrhage or visible lesions, Conjunctiva without redness or discharge Neck:  No deformities, thyromegaly, masses, or tenderness noted.   Supple with full range of motion without pain. Mouth - no lesions, mucous membranes are moist, no decaying teeth   Face - no tenderness around temporal area on either side Neurologic exam : Cn 2-7 intact Strength equal & normal in  lower extremities Able to walk on heels and toes.   Balance normal  Romberg normal, finger to nose normal   Assessments/Plans  Headache New I think this is more stress related with low probability for temporal arteritis since no temporal artery tenderness but given age and possible optometry concerns will check ESR. I have considered and concluded the patient has a very low likelihood of having a CNS bleed (subdural or subarachnoid): Infection (meningitis/encephalitis) or  Space occupying lesion (tumor or abscess):    Abdominal pain Seems improved    See after visit summary for details of patient instructions

## 2017-07-15 NOTE — Assessment & Plan Note (Signed)
Seems improved ?

## 2017-07-15 NOTE — Patient Instructions (Signed)
Good to see you today!  Thanks for coming in.  We will get this Sedimentation Rate - If it is high we may need to do a biopsy  Use the tylenol and ibpurofen as needed   If you loose vision or have new one side weakness then call us immediately  Come in one month for a check of the headache and the abdomen pain

## 2017-08-11 MED FILL — TACROLIMUS 0.1 % OINT: 0.1 | 30 days supply | Qty: 60 | Fill #0

## 2017-08-11 MED FILL — LOSARTAN POTASSIUM 25 MG TA: 25 | 90 days supply | Qty: 90 | Fill #1

## 2017-09-23 ENCOUNTER — Other Ambulatory Visit: Payer: Self-pay

## 2017-09-23 ENCOUNTER — Encounter: Payer: Self-pay | Admitting: Family Medicine

## 2017-09-23 ENCOUNTER — Ambulatory Visit: Payer: BLUE CROSS/BLUE SHIELD | Admitting: Family Medicine

## 2017-09-23 DIAGNOSIS — R109 Unspecified abdominal pain: Secondary | ICD-10-CM

## 2017-09-23 NOTE — Progress Notes (Signed)
Subjective  Colbey S Massman is a 57 y.o. male is presenting with the following  ABDOMINAL PAIN Had for last 10 days went away yesterday.  Today feels back to normal.  No fever or nausea and vomiting or bleeding.  Pain was in epigastric area.  Perhaps like heartburn.  No change in medications or activities  Chief Complaint noted Review of Symptoms - see HPI PMH - Smoking status noted.    Objective Vital Signs reviewed BP 124/72   Pulse 68   Temp 97.7 F (36.5 C) (Oral)   Ht 5\' 5"  (1.651 m)   Wt 183 lb 9.6 oz (83.3 kg)   SpO2 95%   BMI 30.55 kg/m  Abdomen: soft and non-tender without masses, organomegaly or hernias noted.  No guarding or rebound Heart - Regular rate and rhythm.  No murmurs, gallops or rubs.    Lungs:  Normal respiratory effort, chest expands symmetrically. Lungs are clear to auscultation, no crackles or wheezes.  Assessments/Plans  See after visit summary for details of patient instuctions  Abdominal pain Has had before. Given transient nature may have been GERD vs GB disease.  If happens again will check labs

## 2017-09-23 NOTE — Assessment & Plan Note (Signed)
Has had before. Given transient nature may have been GERD vs GB disease.  If happens again will check labs

## 2017-09-23 NOTE — Patient Instructions (Signed)
Good to see you today!  Thanks for coming in.  If the pain comes back  - Try mylanta or Maalox - antiacids as well as Omeprazole - OTC to see if that helps.  -If these do not help then send me a message and I will order blood tests   - If any bleeding or severe pain or fever or rash then come back

## 2017-11-18 ENCOUNTER — Other Ambulatory Visit: Payer: Self-pay | Admitting: Family Medicine

## 2017-11-18 MED FILL — LOSARTAN POTASSIUM 25 MG TA: 25 | 90 days supply | Qty: 90 | Fill #0

## 2018-01-27 ENCOUNTER — Ambulatory Visit: Payer: BLUE CROSS/BLUE SHIELD | Admitting: Cardiology

## 2018-01-27 ENCOUNTER — Encounter: Payer: Self-pay | Admitting: Cardiology

## 2018-01-27 VITALS — BP 134/80 | HR 81 | Ht 66.0 in | Wt 185.0 lb

## 2018-01-27 DIAGNOSIS — E786 Lipoprotein deficiency: Secondary | ICD-10-CM

## 2018-01-27 DIAGNOSIS — I10 Essential (primary) hypertension: Secondary | ICD-10-CM | POA: Diagnosis not present

## 2018-01-27 DIAGNOSIS — R0789 Other chest pain: Secondary | ICD-10-CM | POA: Diagnosis not present

## 2018-01-27 NOTE — Patient Instructions (Signed)
Medication Instructions:  Your physician recommends that you continue on your current medications as directed. Please refer to the Current Medication list given to you today.  If you need a refill on your cardiac medications before your next appointment, please call your pharmacy.   Lab work: Your physician recommends that you have lab work today: Lipid panel  If you have labs (blood work) drawn today and your tests are completely normal, you will receive your results only by: Marland Kitchen MyChart Message (if you have MyChart) OR . A paper copy in the mail If you have any lab test that is abnormal or we need to change your treatment, we will call you to review the results.  Testing/Procedures: None ordered  Follow-Up: At [facility], you and your health needs are our priority.  As part of our continuing mission to provide you with exceptional heart care, we have created designated Provider Care Teams.  These Care Teams include your primary Cardiologist (physician) and Advanced Practice Providers (APPs -  Physician Assistants and Nurse Practitioners) who all work together to provide you with the care you need, when you need it. You will need a follow up appointment in 6 months.  Please call our office 2 months in advance to schedule this appointment.  You may see Jenne Campus or another member of our [employer] Provider Team in [place]: Shirlee More, MD . Jyl Heinz, MD  Any Other Special Instructions Will Be Listed Below (If Applicable).

## 2018-01-27 NOTE — Progress Notes (Signed)
Cardiology Office Note:    Date:  01/27/2018   ID:  [name], DOB 28-Sep-1960, MRN [id number]  PCP:  Lind Covert, MD  Cardiologist:  Jenne Campus, MD    Referring MD: Lind Covert, *   Chief Complaint  Patient presents with  . Follow-up  Doing fair  History of Present Illness:    [name] is a 57 y.o. male with essential hypertension.  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  Does not have time to exercise though we discussed many times that would be very beneficial to him.  Past Medical History:  Diagnosis Date  . Abdominal pain 2009   CT abd pelvis   . RECTAL PAIN 12/08/2007   Chronic - had evaluation by Dr Sharlett Iles in 2009 including EGD, colonscopy, biopsy and abdomen CT.   Repeat abdomen CT in 06/2010 also negative       Past Surgical History:  Procedure Laterality Date  . CIRCUMCISION     Foreskin still partially attached to glans  . COLONOSCOPY  2009   [facility]  . HEMORRHOID SURGERY  2007    [facility]  . UPPER GASTROINTESTINAL ENDOSCOPY  2009   [facility]    Current Medications: Current Meds  Medication Sig  . aspirin 81 MG EC tablet Take 81 mg by mouth daily.    Marland Kitchen losartan (COZAAR) 25 MG tablet TAKE 1 TABLET BY MOUTH DAILY.  . nitroGLYCERIN (NITROSTAT) 0.4 MG SL tablet Place 1 tablet (0.4 mg total) under the tongue every 5 (five) minutes as needed for chest pain.     Allergies:   Patient has no known allergies.   Social History   Socioeconomic History  . Marital status: Married    Spouse name: Not on file  . Number of children: Not on file  . Years of education: Not on file  . Highest education level: Not on file  Occupational History  . Not on file    Social Needs  . Financial resource strain: Not on file  . Food insecurity:    Worry: Not on file    Inability: Not on file  . Transportation needs:    Medical: Not on file    Non-medical: Not on file  Tobacco Use  . Smoking status: Former Smoker    Packs/day: 2.00    Years: 28.00    Pack years: 56.00    Types: Cigarettes  . Smokeless tobacco: Never Used  . Tobacco comment: smoked for 28 yr- smoked up to 2-3ppd. quit smoking 30yr ago  Substance and Sexual Activity  . Alcohol use: No  . Drug use: No  . Sexual activity: Yes    Partners: Female  Lifestyle  . Physical activity:    Days per week: Not on file    Minutes per session: Not on file  . Stress: Not on file  Relationships  . Social connections:    Talks on phone: Not on file    Gets together: Not on file    Attends religious service: Not on file    Active member of club or organization: Not on file    Attends meetings of clubs or organizations: Not on file    Relationship status: Not on file  Other Topics Concern  . Not on file  Social History Narrative   Born [place];   Brother is a physician in middle [place]   Has several children near by   Works as a Geophysicist/field seismologist   No other real hobbies    April 2018     Family History: The patient's family history includes Diabetes in his brother and mother; Hypertension in his mother; Sudden death in his father. ROS:   Please see the history of present illness.    All 14 point review of systems negative except as described per history of present illness  EKGs/Labs/Other Studies Reviewed:      Recent Labs: 07/08/2017: ALT 19; BUN 14; Creatinine, Ser 1.09; Hemoglobin 15.0; Platelets 274; Potassium 4.5; Sodium 142  Recent Lipid Panel    Component Value Date/Time   CHOL 172 11/12/2016 0952   TRIG 215 (H) 11/12/2016 0952   HDL 38 (L) 11/12/2016 0952   CHOLHDL 4.5 11/12/2016 0952   CHOLHDL 4.9 07/19/2013 1012   VLDL 26 07/19/2013 1012   LDLCALC 91 11/12/2016 0952     Physical Exam:    VS:  BP 134/80   Pulse 81   Ht 5\' 6"  (1.676 m)   Wt 185 lb (83.9 kg)   SpO2 97%   BMI 29.86 kg/m     Wt Readings from Last 3 Encounters:  01/27/18 185 lb (83.9 kg)  09/23/17 183 lb 9.6 oz (83.3 kg)  07/15/17 185 lb 9.6 oz (84.2 kg)     GEN:  Well nourished, well developed in no acute distress HEENT: Normal NECK: No JVD; No carotid bruits LYMPHATICS: No lymphadenopathy CARDIAC: RRR, no murmurs, no rubs, no gallops RESPIRATORY:  Clear to auscultation without rales, wheezing or rhonchi  ABDOMEN: Soft, non-tender, non-distended MUSCULOSKELETAL:  No edema; No deformity  SKIN: Warm and dry LOWER EXTREMITIES: no swelling NEUROLOGIC:  Alert and oriented x 3 PSYCHIATRIC:  Normal affect   ASSESSMENT:    1. Low HDL (under 40)   2. HYPERTENSION, BENIGN ESSENTIAL   3. Atypical chest pain    PLAN:    In order of problems listed above:  1. Low HDL I told him the best way to improve it is to exercise on the regular basis we will schedule him to have fasting lipid profile done. 2. Essential hypertension blood pressure appears to be controlled with all medication he is on which I will continue I stressed importance of avoiding salty food and regular exercises as well as relaxation technique. 3. Atypical chest pain denies having any.  It looks like a major problem with him is very stressful life that he has he does not know how to do with the stress therefore we spent some time talking about exercises relaxation technique optimistic thinking.  Hopefully build to comply with this advice and feel better.   Medication Adjustments/Labs and Tests Ordered: Current medicines are reviewed at length with the patient today.  Concerns regarding medicines are outlined above.  Orders Placed This Encounter  Procedures  . Lipid panel   Medication changes: No orders of the defined types were placed in this encounter.   Signed, Park Liter, MD, [facility] 01/27/2018  11:28 AM    [place]

## 2018-01-28 LAB — LIPID PANEL
CHOLESTEROL TOTAL: 200 mg/dL — AB (ref 100–199)
Chol/HDL Ratio: 5.6 ratio — ABNORMAL HIGH (ref 0.0–5.0)
HDL: 36 mg/dL — ABNORMAL LOW (ref >39)
LDL CALC: 123 mg/dL — AB (ref 0–99)
Triglycerides: 204 mg/dL — ABNORMAL HIGH (ref 0–149)
VLDL Cholesterol Cal: 41 mg/dL — ABNORMAL HIGH (ref 5–40)

## 2018-02-05 ENCOUNTER — Other Ambulatory Visit: Payer: Self-pay

## 2018-02-05 DIAGNOSIS — E7800 Pure hypercholesterolemia, unspecified: Secondary | ICD-10-CM

## 2018-02-05 DIAGNOSIS — E786 Lipoprotein deficiency: Secondary | ICD-10-CM

## 2018-02-05 MED ORDER — ATORVASTATIN CALCIUM 10 MG PO TABS: 10.0000 mg | ORAL_TABLET | Freq: Every day | ORAL | 1 refills | Status: DC

## 2018-02-16 MED FILL — LOSARTAN POTASSIUM 25 MG TA: 25 | 90 days supply | Qty: 90 | Fill #1

## 2018-02-17 ENCOUNTER — Other Ambulatory Visit: Payer: Self-pay

## 2018-02-17 ENCOUNTER — Ambulatory Visit (INDEPENDENT_AMBULATORY_CARE_PROVIDER_SITE_OTHER): Payer: BLUE CROSS/BLUE SHIELD | Admitting: Family Medicine

## 2018-02-17 ENCOUNTER — Encounter: Payer: Self-pay | Admitting: Family Medicine

## 2018-02-17 VITALS — BP 126/70 | HR 77 | Temp 98.7°F | Ht 66.0 in | Wt 184.0 lb

## 2018-02-17 DIAGNOSIS — Z23 Encounter for immunization: Secondary | ICD-10-CM | POA: Diagnosis not present

## 2018-02-17 DIAGNOSIS — R350 Frequency of micturition: Secondary | ICD-10-CM

## 2018-02-17 DIAGNOSIS — I10 Essential (primary) hypertension: Secondary | ICD-10-CM

## 2018-02-17 DIAGNOSIS — Z1211 Encounter for screening for malignant neoplasm of colon: Secondary | ICD-10-CM

## 2018-02-17 LAB — POCT URINALYSIS DIP (MANUAL ENTRY)
BILIRUBIN UA: NEGATIVE
GLUCOSE UA: NEGATIVE mg/dL
Ketones, POC UA: NEGATIVE mg/dL
LEUKOCYTES UA: NEGATIVE
NITRITE UA: NEGATIVE
Protein Ur, POC: NEGATIVE mg/dL
RBC UA: NEGATIVE
Spec Grav, UA: 1.01 (ref 1.010–1.025)
Urobilinogen, UA: 0.2 E.U./dL
pH, UA: 6.5 (ref 5.0–8.0)

## 2018-02-17 NOTE — Progress Notes (Signed)
Subjective  [name] is a 57 y.o. male is presenting with the following  URINARY FREQUENCY A long standing problem that is worsening.  Goes to urinate many times a day with small amounts and is up many times at night.  No cloudiness or bleeding or fever.  He does feel like he is not working well from the waist down with sometimes frequent bms (no incontience) and pain in his lower legs.  No definite weakness  HYPERTENSION Disease Monitoring  Home BP Monitoring (Severity) not checking  Symptoms - Chest pain- no    Dyspnea- no Medications (Modifying factors) Compliance-  daily. Lightheadedness-  no  Edema- no Timing - continuous  Duration - years ROS - See HPI  PMH Lab Review   Potassium  Date Value Ref Range Status  07/08/2017 4.5 3.5 - 5.2 mmol/L Final   Sodium  Date Value Ref Range Status  07/08/2017 142 134 - 144 mmol/L Final   Creat  Date Value Ref Range Status  06/09/2015 1.03 0.70 - 1.33 mg/dL Final   Creatinine, Ser  Date Value Ref Range Status  07/08/2017 1.09 0.76 - 1.27 mg/dL Final     Chief Complaint noted Review of Symptoms - see HPI PMH - Smoking status noted.    Objective Vital Signs reviewed BP 126/70 (BP Location: Right Arm, Patient Position: Sitting, Cuff Size: Large)   Pulse 77   Temp 98.7 F (37.1 C) (Oral)   Ht 5\' 6"  (1.676 m)   Wt 184 lb (83.5 kg)   SpO2 98%   BMI 29.70 kg/m  Neurologic exam : Strength equal & normal in  lower extremities Able to walk on heels and toes.   Balance normal  Pulses - normal post tib and dors pedis Normal hair on toes Foot Check -  Appearance - no lesions, ulcers or calluses Skin - no unusual pallor or redness Monofilament testing -  Right - Great toe, medial, central, lateral ball and posterior foot intact Left - Great toe, medial, central, lateral ball and posterior foot intact Normal testis penis exam without masses or skin changes   Assessments/Plans  See after visit summary for details of  patient instuctions  Urinary frequency Worsened.  Unsure of cause.  Does not seem to have lower extrem weakness or neuropathy so probably localized to the bladder.  May benefit from urometry studies and bladder training.  Will refer   HYPERTENSION, BENIGN ESSENTIAL Well controlled.  Will stop asa given latest data

## 2018-02-17 NOTE — Patient Instructions (Signed)
Good to see you today!  Thanks for coming in.  Will refer you to Urology for workup of urinary frequency.   Keep a diary of how often and much you urinate and how often you get up a night for 24hours before you see them  If you do not hear from them in 2 weeks then give me a call  I sent a referral to Dr Sharlett Iles for a repeat colonoscopy

## 2018-02-17 NOTE — Assessment & Plan Note (Signed)
Well controlled.  Will stop asa given latest data

## 2018-02-17 NOTE — Assessment & Plan Note (Signed)
Worsened.  Unsure of cause.  Does not seem to have lower extrem weakness or neuropathy so probably localized to the bladder.  May benefit from urometry studies and bladder training.  Will refer

## 2018-02-24 ENCOUNTER — Encounter: Payer: Self-pay | Admitting: Gastroenterology

## 2018-03-10 ENCOUNTER — Ambulatory Visit (AMBULATORY_SURGERY_CENTER): Payer: Self-pay

## 2018-03-10 ENCOUNTER — Encounter: Payer: Self-pay | Admitting: Gastroenterology

## 2018-03-10 VITALS — Ht 66.0 in | Wt 184.0 lb

## 2018-03-10 DIAGNOSIS — Z800 Family history of malignant neoplasm of digestive organs: Secondary | ICD-10-CM

## 2018-03-10 MED ORDER — PEG 3350-KCL-NA BICARB-NACL 420 G PO SOLR: 4000.0000 mL | Freq: Once | ORAL | 0 refills | Status: AC

## 2018-03-10 NOTE — Progress Notes (Signed)
Per pt, no allergies to soy or egg products.Pt not taking any weight loss meds or using  O2 at home.  Pt refused emmi video. 

## 2018-03-20 ENCOUNTER — Encounter: Payer: Self-pay | Admitting: Gastroenterology

## 2018-03-20 ENCOUNTER — Ambulatory Visit (AMBULATORY_SURGERY_CENTER): Payer: BLUE CROSS/BLUE SHIELD | Admitting: Gastroenterology

## 2018-03-20 VITALS — BP 115/74 | HR 70 | Temp 99.1°F | Resp 18 | Ht 66.0 in | Wt 184.0 lb

## 2018-03-20 DIAGNOSIS — D125 Benign neoplasm of sigmoid colon: Secondary | ICD-10-CM

## 2018-03-20 DIAGNOSIS — Z1211 Encounter for screening for malignant neoplasm of colon: Secondary | ICD-10-CM

## 2018-03-20 DIAGNOSIS — D122 Benign neoplasm of ascending colon: Secondary | ICD-10-CM | POA: Diagnosis not present

## 2018-03-20 MED ORDER — SODIUM CHLORIDE 0.9 % IV SOLN: 500.0000 mL | Freq: Once | INTRAVENOUS | Status: DC

## 2018-03-20 NOTE — Patient Instructions (Signed)
YOU HAD AN ENDOSCOPIC PROCEDURE TODAY AT THE [facility]:   Refer to the procedure report that was given to you for any specific questions about what was found during the examination.  If the procedure report does not answer your questions, please call your gastroenterologist to clarify.  If you requested that your care partner not be given the details of your procedure findings, then the procedure report has been included in a sealed envelope for you to review at your convenience later.  YOU SHOULD EXPECT: Some feelings of bloating in the abdomen. Passage of more gas than usual.  Walking can help get rid of the air that was put into your GI tract during the procedure and reduce the bloating. If you had a lower endoscopy (such as a colonoscopy or flexible sigmoidoscopy) you may notice spotting of blood in your stool or on the toilet paper. If you underwent a bowel prep for your procedure, you may not have a normal bowel movement for a few days.  Please Note:  You might notice some irritation and congestion in your nose or some drainage.  This is from the oxygen used during your procedure.  There is no need for concern and it should clear up in a day or so.  SYMPTOMS TO REPORT IMMEDIATELY:   Following lower endoscopy (colonoscopy or flexible sigmoidoscopy):  Excessive amounts of blood in the stool  Significant tenderness or worsening of abdominal pains  Swelling of the abdomen that is new, acute  Fever of 100F or higher   Following upper endoscopy (EGD)  Vomiting of blood or coffee ground material  New chest pain or pain under the shoulder blades  Painful or persistently difficult swallowing  New shortness of breath  Fever of 100F or higher  Black, tarry-looking stools  For urgent or emergent issues, a gastroenterologist can be reached at any hour by calling [phone].   DIET:  We do recommend a small meal at first, but then you may proceed to your regular diet.  Drink  plenty of fluids but you should avoid alcoholic beverages for 24 hours.  ACTIVITY:  You should plan to take it easy for the rest of today and you should NOT DRIVE or use heavy machinery until tomorrow (because of the sedation medicines used during the test).    FOLLOW UP: Our staff will call the number listed on your records the next business day following your procedure to check on you and address any questions or concerns that you may have regarding the information given to you following your procedure. If we do not reach you, we will leave a message.  However, if you are feeling well and you are not experiencing any problems, there is no need to return our call.  We will assume that you have returned to your regular daily activities without incident.  If any biopsies were taken you will be contacted by phone or by letter within the next 1-3 weeks.  Please call us at [phone] if you have not heard about the biopsies in 3 weeks.    SIGNATURES/CONFIDENTIALITY: You and/or your care partner have signed paperwork which will be entered into your electronic medical record.  These signatures attest to the fact that that the information above on your After Visit Summary has been reviewed and is understood.  Full responsibility of the confidentiality of this discharge information lies with you and/or your care-partner.  Polyp information given. 

## 2018-03-20 NOTE — Progress Notes (Signed)
Called to room to assist during endoscopic procedure.  Patient ID and intended procedure confirmed with present staff. Received instructions for my participation in the procedure from the performing physician.  

## 2018-03-20 NOTE — Progress Notes (Signed)
PT taken to PACU. Monitors in place. VSS. Report given to RN. 

## 2018-03-20 NOTE — Op Note (Signed)
[facility] Patient Name: [name] Procedure Date: 03/20/2018 2:02 PM MRN: [id number] Endoscopist: Milus Banister , MD Age: 57 Referring MD:  Date of Birth: 11-21-1960 Gender: Male Account #: [id number] Procedure:                Colonoscopy Indications:              Screening for colorectal malignant neoplasm Medicines:                Monitored Anesthesia Care Procedure:                Pre-Anesthesia Assessment:                           - Prior to the procedure, a History and Physical                            was performed, and patient medications and                            allergies were reviewed. The patient's tolerance of                            previous anesthesia was also reviewed. The risks                            and benefits of the procedure and the sedation                            options and risks were discussed with the patient.                            All questions were answered, and informed consent                            was obtained. Prior Anticoagulants: The patient has                            taken no previous anticoagulant or antiplatelet                            agents. ASA Grade Assessment: II - A patient with                            mild systemic disease. After reviewing the risks                            and benefits, the patient was deemed in                            satisfactory condition to undergo the procedure.                           After obtaining informed consent, the colonoscope  was passed under direct vision. Throughout the                            procedure, the patient's blood pressure, pulse, and                            oxygen saturations were monitored continuously. The                            Colonoscope was introduced through the anus and                            advanced to the the cecum, identified by                            appendiceal orifice and  ileocecal valve. The                            colonoscopy was performed without difficulty. The                            patient tolerated the procedure well. The quality                            of the bowel preparation was good. The ileocecal                            valve, appendiceal orifice, and rectum were                            photographed. Scope In: 2:10:48 PM Scope Out: 2:23:36 PM Scope Withdrawal Time: 0 hours 10 minutes 51 seconds  Total Procedure Duration: 0 hours 12 minutes 48 seconds  Findings:                 Four sessile polyps were found in the sigmoid colon                            and ascending colon. The polyps were 2 to 3 mm in                            size. These polyps were removed with a cold snare.                            Resection and retrieval were complete.                           Minor submucosal bulge in distal rectum near                            hemorrhoidal staple line (unchanged from 10 years                            ago).  The exam was otherwise without abnormality on                            direct and retroflexion views. Complications:            No immediate complications. Estimated blood loss:                            None. Estimated Blood Loss:     Estimated blood loss: none. Impression:               - Four 2 to 3 mm polyps in the sigmoid colon and in                            the ascending colon, removed with a cold snare.                            Resected and retrieved.                           - Minor submucosal bulge in distal rectum near                            hemorrhoidal staple line (unchanged from 10 years                            ago).                           - The examination was otherwise normal on direct                            and retroflexion views. Recommendation:           - Patient has a contact number available for                            emergencies. The  signs and symptoms of potential                            delayed complications were discussed with the                            patient. Return to normal activities tomorrow.                            Written discharge instructions were provided to the                            patient.                           - Resume previous diet.                           - Continue present medications.  You will receive a letter within 2-3 weeks with the                            pathology results and my final recommendations.                           If the polyp(s) is proven to be 'pre-cancerous' on                            pathology, you will need repeat colonoscopy in 3-5                            years. If the polyp(s) is NOT 'precancerous' on                            pathology then you should repeat colon cancer                            screening in 10 years with colonoscopy without need                            for colon cancer screening by any method prior to                            then (including stool testing). Milus Banister, MD 03/20/2018 2:26:40 PM This report has been signed electronically.

## 2018-03-23 ENCOUNTER — Telehealth: Payer: Self-pay

## 2018-03-23 NOTE — Telephone Encounter (Signed)
Attempted to reach pt. With follow-up call following endoscopic procedure 03/20/2018.  LM On pt. Mail.  Will try to reach pt. Again later today.

## 2018-03-23 NOTE — Telephone Encounter (Signed)
  Follow up Call-  Call back number 03/20/2018  Post procedure Call Back phone  # [phone]  Permission to leave phone message Yes  Some recent data might be hidden     Patient questions:  Do you have a fever, pain , or abdominal swelling? No. Pain Score  0 *  Have you tolerated food without any problems? Yes.    Have you been able to return to your normal activities? Yes.    Do you have any questions about your discharge instructions: Diet   No. Medications  No. Follow up visit  No.  Do you have questions or concerns about your Care? No.  Actions: * If pain score is 4 or above: No action needed, pain <4.

## 2018-03-30 ENCOUNTER — Encounter: Payer: Self-pay | Admitting: Gastroenterology

## 2018-05-14 DIAGNOSIS — G5793 Unspecified mononeuropathy of bilateral lower limbs: Secondary | ICD-10-CM | POA: Insufficient documentation

## 2018-05-14 DIAGNOSIS — E785 Hyperlipidemia, unspecified: Secondary | ICD-10-CM | POA: Insufficient documentation

## 2018-05-14 MED FILL — TAMSULOSIN HCL 0.4 MG CAP: 0.4 | 30 days supply | Qty: 30 | Fill #0

## 2018-05-19 MED FILL — LOSARTAN POTASSIUM 25 MG TA: 25 | 90 days supply | Qty: 90 | Fill #2

## 2018-06-23 MED FILL — GABAPENTIN 300 MG CAPSULE: 300 | 30 days supply | Qty: 90 | Fill #0

## 2018-08-17 MED FILL — LOSARTAN POTASSIUM 25 MG TA: 25 | 30 days supply | Qty: 30 | Fill #0

## 2018-09-09 MED FILL — FINASTERIDE 1 MG TABLET: 1 | 30 days supply | Qty: 30 | Fill #0

## 2018-09-09 MED FILL — LOSARTAN POTASSIUM 50 MG TA: 50 | 30 days supply | Qty: 30 | Fill #0

## 2018-09-09 MED FILL — ATORVASTATIN 10 MG TABLET: 10 | 90 days supply | Qty: 90 | Fill #0

## 2018-09-11 MED FILL — GABAPENTIN 300 MG CAPSULE: 300 | 30 days supply | Qty: 90 | Fill #1

## 2018-10-07 MED FILL — MELOXICAM 7.5 MG TABLET: 7.5 | 30 days supply | Qty: 30 | Fill #0

## 2018-10-07 MED FILL — LOSARTAN POTASSIUM 50 MG TA: 50 | 30 days supply | Qty: 30 | Fill #1

## 2018-10-08 MED FILL — FINASTERIDE 1 MG TABLET: 1 | 30 days supply | Qty: 30 | Fill #0

## 2018-11-05 MED FILL — MELOXICAM 7.5 MG TABLET: 7.5 | 30 days supply | Qty: 30 | Fill #1

## 2018-11-05 MED FILL — LOSARTAN POTASSIUM 50 MG TA: 50 | 30 days supply | Qty: 30 | Fill #2

## 2018-12-10 MED FILL — MELOXICAM 7.5 MG TABLET: 7.5 | 30 days supply | Qty: 30 | Fill #0

## 2018-12-10 MED FILL — GABAPENTIN 300 MG CAPSULE: 300 | 30 days supply | Qty: 90 | Fill #0

## 2018-12-10 MED FILL — FINASTERIDE 1 MG TABLET: 1 | 30 days supply | Qty: 30 | Fill #0

## 2018-12-10 MED FILL — LOSARTAN POTASSIUM 50 MG TA: 50 | 90 days supply | Qty: 90 | Fill #0

## 2018-12-10 MED FILL — ATORVASTATIN 10 MG TABLET: 10 | 90 days supply | Qty: 90 | Fill #0

## 2019-02-18 MED FILL — LOSARTAN POTASSIUM 50 MG TA: 50 | 90 days supply | Qty: 90 | Fill #1

## 2019-02-18 MED FILL — GABAPENTIN 300 MG CAPSULE: 300 | 30 days supply | Qty: 90 | Fill #1

## 2019-03-31 DIAGNOSIS — M4802 Spinal stenosis, cervical region: Secondary | ICD-10-CM | POA: Insufficient documentation

## 2019-03-31 MED FILL — FINASTERIDE 1 MG TABLET: 1 | 90 days supply | Qty: 90 | Fill #0

## 2019-03-31 MED FILL — ATORVASTATIN 10 MG TABLET: 10 | 90 days supply | Qty: 90 | Fill #0

## 2019-04-15 ENCOUNTER — Ambulatory Visit: Payer: 59 | Attending: Internal Medicine

## 2019-04-15 DIAGNOSIS — Z20822 Contact with and (suspected) exposure to covid-19: Secondary | ICD-10-CM | POA: Insufficient documentation

## 2019-04-17 LAB — NOVEL CORONAVIRUS, NAA: SARS-CoV-2, NAA: NOT DETECTED

## 2019-05-20 ENCOUNTER — Encounter: Payer: Self-pay | Admitting: Family Medicine

## 2019-05-20 ENCOUNTER — Other Ambulatory Visit: Payer: Self-pay

## 2019-05-20 ENCOUNTER — Ambulatory Visit (INDEPENDENT_AMBULATORY_CARE_PROVIDER_SITE_OTHER): Payer: 59 | Admitting: Family Medicine

## 2019-05-20 DIAGNOSIS — G629 Polyneuropathy, unspecified: Secondary | ICD-10-CM | POA: Diagnosis not present

## 2019-05-20 DIAGNOSIS — R5382 Chronic fatigue, unspecified: Secondary | ICD-10-CM

## 2019-05-20 DIAGNOSIS — I10 Essential (primary) hypertension: Secondary | ICD-10-CM

## 2019-05-20 NOTE — Patient Instructions (Signed)
Good to see you today!  Thanks for coming in.  For the fatigue I will refer you to a sleep study.  They should call you to set up an appointment   I will call you if your tests are not good.  Otherwise I will send you a letter.  If you do not hear from me with in 2 weeks please call our office.    Depending on the sleep study we will refer you as needed to neurology

## 2019-05-20 NOTE — Assessment & Plan Note (Signed)
BP Readings from Last 3 Encounters:  05/20/19 136/78  03/20/18 115/74  02/17/18 126/70   Under control Check labs

## 2019-05-20 NOTE — Assessment & Plan Note (Signed)
>>  ASSESSMENT AND PLAN FOR NEUROPATHY WRITTEN ON 05/20/2019  4:13 PM BY Raphaela Cannaday L, MD  Not well controlled.  May need NCS.  Decided to test for sleep apnea first then persue this

## 2019-05-20 NOTE — Assessment & Plan Note (Signed)
History sounds consistent with sleep apnea.  Will send for sleep study

## 2019-05-20 NOTE — Assessment & Plan Note (Signed)
Not well controlled.  May need NCS.  Decided to test for sleep apnea first then persue this

## 2019-05-20 NOTE — Progress Notes (Signed)
   CHIEF COMPLAINT / HPI:  FATIGUE Feels tired a lot with achiness all over.  His wife says he snores a lot and has periods of stopping breathing.  Never been tested for SA  NEUROPATHY Pain in both lower limbs and numbness tingling in feet for years.  Was made an appointment when seeing a [facility] PCP but did not go.  Gabapentin helps some.  No sores or lesions  PERTINENT  PMH / PSH: Urinary frequency   OBJECTIVE: BP 136/78   Pulse 78   Wt 180 lb 3.2 oz (81.7 kg)   SpO2 99%   BMI 29.09 kg/m   Psych:  Cognition and judgment appear intact. Alert, communicative  and cooperative with normal attention span and concentration. No apparent delusions, illusions, hallucinations   ASSESSMENT / PLAN:  HYPERTENSION, BENIGN ESSENTIAL BP Readings from Last 3 Encounters:  05/20/19 136/78  03/20/18 115/74  02/17/18 126/70   Under control Check labs  Neuropathy Not well controlled.  May need NCS.  Decided to test for sleep apnea first then persue this   Fatigue History sounds consistent with sleep apnea.  Will send for sleep study      Lind Covert, [place]

## 2019-05-21 ENCOUNTER — Encounter: Payer: Self-pay | Admitting: Family Medicine

## 2019-05-21 LAB — CMP14+EGFR
ALT: 21 IU/L (ref 0–44)
AST: 19 IU/L (ref 0–40)
Albumin/Globulin Ratio: 2.4 — ABNORMAL HIGH (ref 1.2–2.2)
Albumin: 4.7 g/dL (ref 3.8–4.9)
Alkaline Phosphatase: 47 IU/L (ref 39–117)
BUN/Creatinine Ratio: 14 (ref 9–20)
BUN: 14 mg/dL (ref 6–24)
Bilirubin Total: 0.3 mg/dL (ref 0.0–1.2)
CO2: 24 mmol/L (ref 20–29)
Calcium: 10 mg/dL (ref 8.7–10.2)
Chloride: 101 mmol/L (ref 96–106)
Creatinine, Ser: 1.02 mg/dL (ref 0.76–1.27)
GFR calc Af Amer: 93 mL/min/{1.73_m2} (ref >59)
GFR calc non Af Amer: 80 mL/min/{1.73_m2} (ref >59)
Globulin, Total: 2 g/dL (ref 1.5–4.5)
Glucose: 93 mg/dL (ref 65–99)
Potassium: 4.6 mmol/L (ref 3.5–5.2)
Sodium: 139 mmol/L (ref 134–144)
Total Protein: 6.7 g/dL (ref 6.0–8.5)

## 2019-05-21 LAB — CBC
Hematocrit: 44.8 % (ref 37.5–51.0)
Hemoglobin: 14.8 g/dL (ref 13.0–17.7)
MCH: 28.4 pg (ref 26.6–33.0)
MCHC: 33 g/dL (ref 31.5–35.7)
MCV: 86 fL (ref 79–97)
Platelets: 255 10*3/uL (ref 150–450)
RBC: 5.22 x10E6/uL (ref 4.14–5.80)
RDW: 13.9 % (ref 11.6–15.4)
WBC: 6.4 10*3/uL (ref 3.4–10.8)

## 2019-06-01 ENCOUNTER — Encounter: Payer: Self-pay | Admitting: Neurology

## 2019-06-01 ENCOUNTER — Ambulatory Visit (INDEPENDENT_AMBULATORY_CARE_PROVIDER_SITE_OTHER): Payer: 59 | Admitting: Neurology

## 2019-06-01 ENCOUNTER — Other Ambulatory Visit: Payer: Self-pay

## 2019-06-01 VITALS — BP 136/80 | HR 82 | Temp 97.2°F | Ht 66.0 in | Wt 180.3 lb

## 2019-06-01 DIAGNOSIS — R0683 Snoring: Secondary | ICD-10-CM

## 2019-06-01 DIAGNOSIS — R0681 Apnea, not elsewhere classified: Secondary | ICD-10-CM

## 2019-06-01 DIAGNOSIS — E663 Overweight: Secondary | ICD-10-CM

## 2019-06-01 DIAGNOSIS — R351 Nocturia: Secondary | ICD-10-CM | POA: Diagnosis not present

## 2019-06-01 DIAGNOSIS — R519 Headache, unspecified: Secondary | ICD-10-CM

## 2019-06-01 NOTE — Progress Notes (Signed)
wSubjective:    Patient ID: [name] is a 59 y.o. male.  HPI     Star Age, MD, PhD [facility] [address] P.O. [place],  [address]  Dear [name],   I saw your patient, [name], upon your kind request in my sleep clinic today for initial consultation of his sleep disorder, in particular, concern for underlying obstructive sleep apnea.  The patient is unaccompanied today.  As you know, [name] is a 59 year old right-handed gentleman with an underlying medical history of hypertension, leg pain, and overweight state, who reports snoring and witnessed apneas per wife's report.  I reviewed her office note from 05/20/2019.  His Epworth sleepiness score is 3 out of 24.  He occasionally wakes up with a headache in the back of his head.  Sometimes he takes ibuprofen, he does have significant nocturia, 2-3 times per average night.  He has some difficulty falling asleep and staying asleep.  Bedtime is currently past midnight, rise time around 8.  He works as a Geophysicist/field seismologist, Clinical research associate.  He is a non-smoker and does not drink alcohol, drinks caffeine in the form of tea, about 3 cups/day, no pets in the home.  Lives with his wife and youngest child who is in middle school, he has 2 in college.  He has a TV in the bedroom but turns it off when he is ready to fall asleep.   His Past Medical History Is Significant For: Past Medical History:  Diagnosis Date  . Abdominal pain 2009   CT abd pelvis /chronic  . Allergy    seasonal  . Hypertension   . RECTAL PAIN 12/08/2007   Chronic - had evaluation by Justin Sharlett [name] in 2009 including EGD, colonscopy, biopsy and abdomen CT.   Repeat abdomen CT in 06/2010 also negative       His Past Surgical History Is Significant For: Past Surgical History:  Procedure Laterality Date  . CIRCUMCISION     Foreskin still partially attached to glans  . COLONOSCOPY  2009   [facility]  . HEMORRHOID  SURGERY  2007    [facility]  . UPPER GASTROINTESTINAL ENDOSCOPY  2009   [facility]    His Family History Is Significant For: Family History  Problem Relation Age of Onset  . Diabetes Brother   . Sudden death Father   . Hypertension Mother   . Diabetes Mother   . Diabetes Sister   . Diabetes Cousin   . Colon cancer Sister   . Brain cancer Sister   . Pneumonia Brother     His Social History Is Significant For: Social History   Socioeconomic History  . Marital status: Married    Spouse name: Not on file  . Number of children: Not on file  . Years of education: Not on file  . Highest education level: Not on file  Occupational History  . Not on file  Tobacco Use  . Smoking status: Former Smoker    Packs/day: 2.00    Years: 28.00    Pack years: 56.00    Types: Cigarettes    Quit date: 2004    Years since quitting: 17.1  . Smokeless tobacco: Never Used  . Tobacco comment: smoked for 28 yr- smoked up to 2-3ppd. quit smoking 68yr ago  Substance and Sexual Activity  . Alcohol use: No  . Drug use: No  . Sexual activity: Yes    Partners: Female  Other Topics Concern  .  Not on file  Social History Narrative   Born [name];   Brother is a physician in middle [place]   Has several children near by   Works as a Geophysicist/field seismologist   No other real hobbies    April 2018   Social Determinants of Health   Financial Resource Strain:   . Difficulty of Paying Living Expenses: Not on file  Food Insecurity:   . Worried About Charity fundraiser in the Last Year: Not on file  . Ran Out of Food in the Last Year: Not on file  Transportation Needs:   . Lack of Transportation (Medical): Not on file  . Lack of Transportation (Non-Medical): Not on file  Physical Activity:   . Days of Exercise per Week: Not on file  . Minutes of Exercise per Session: Not on file  Stress:   . Feeling of Stress : Not on file  Social Connections:   . Frequency of Communication with Friends and Family: Not on file   . Frequency of Social Gatherings with Friends and Family: Not on file  . Attends Religious Services: Not on file  . Active Member of Clubs or Organizations: Not on file  . Attends Archivist Meetings: Not on file  . Marital Status: Not on file    His Allergies Are:  No Known Allergies:   His Current Medications Are:  Outpatient Encounter Medications as of 06/01/2019  Medication Sig  . aspirin 81 MG EC tablet Take 81 mg by mouth daily.    . finasteride (PROPECIA) 1 MG tablet Take 1 mg by mouth daily.  Marland Kitchen gabapentin (NEURONTIN) 300 MG capsule Take 300 mg by mouth 3 (three) times daily.  Marland Kitchen losartan (COZAAR) 25 MG tablet TAKE 1 TABLET BY MOUTH DAILY. (Patient taking differently: at bedtime. )  . Multiple Vitamins-Minerals (MENS 50+ MULTI VITAMIN/MIN PO) Take by mouth daily.  Marland Kitchen atorvastatin (LIPITOR) 10 MG tablet Take 1 tablet (10 mg total) by mouth daily.   No facility-administered encounter medications on file as of 06/01/2019.  :  Review of Systems:  Out of a complete 14 point review of systems, all are reviewed and negative with the exception of these symptoms as listed below:  Review of Systems  Neurological:       Here for sleep consult.  No prior sleep study, snoring is present.  Epworth Sleepiness Scale 0= would never doze 1= slight chance of dozing 2= moderate chance of dozing 3= high chance of dozing  Sitting and reading:0 Watching TV:1 Sitting inactive in a public place (ex. Theater or meeting):0 As a passenger in a car for an hour without a break:0 Lying down to rest in the afternoon:2 Sitting and talking to someone:0 Sitting quietly after lunch (no alcohol):0 In a car, while stopped in traffic:0 Total:3     Objective:  Neurological Exam Physical Exam Physical Examination:   Vitals:   06/01/19 1545  BP: 136/80  Pulse: 82  Temp: (!) 97.2 F (36.2 C)  SpO2: 96%    General Examination: The patient is a very pleasant 59 y.o. male in no acute  distress. He appears well-developed and well-nourished and well groomed.   HEENT: Normocephalic, atraumatic, pupils are equal, round and reactive to light, extraocular tracking is good without limitation to gaze excursion or nystagmus noted. [name] is grossly intact. Face is symmetric with normal facial animation. Speech is clear with no dysarthria noted. There is no hypophonia. There is no lip, neck/head, jaw or voice  tremor. Neck is supple with full range of passive and active motion. There are no carotid bruits on auscultation. Oropharynx exam reveals: mild mouth dryness, adequate dental hygiene and moderate airway crowding, due to Tonsillar size of 1+, larger uvula, Mallampati class II, neck circumference of 16-5/8 inches.  Tongue protrudes centrally in palate elevates symmetrically.  Chest: Clear to auscultation without wheezing, rhonchi or crackles noted.  Heart: S1+S2+0, regular and normal without murmurs, rubs or gallops noted.   Abdomen: Soft, non-tender and non-distended with normal bowel sounds appreciated on auscultation.  Extremities: There is no pitting edema in the distal lower extremities bilaterally.   Skin: Warm and dry without trophic changes noted.   Musculoskeletal: exam reveals no obvious joint deformities, tenderness or joint swelling or erythema.   Neurologically:  Mental status: The patient is awake, alert and oriented in all 4 spheres. His immediate and remote memory, attention, language skills and fund of knowledge are appropriate. There is no evidence of aphasia, agnosia, apraxia or anomia. Speech is clear with normal prosody and enunciation. Thought process is linear. Mood is normal and affect is normal.  Cranial nerves II - XII are as described above under HEENT exam.  Motor exam: Normal bulk, strength and tone is noted. There is no tremor, Romberg is negative. Fine motor skills and coordination: grossly intact.  Cerebellar testing: No dysmetria or intention tremor.  There is no truncal or gait ataxia.  Sensory exam: intact to light touch in the upper and lower extremities.  Gait, station and balance: He stands easily. No veering to one side is noted. No leaning to one side is noted. Posture is age-appropriate and stance is narrow based. Gait shows normal stride length and normal pace. No problems turning are noted. Tandem walk is Challenging for him.            Assessment and Plan:   In summary, Kwali S Austerman is a very pleasant 59 y.o.-year old male with an underlying medical history of hypertension, leg pain, and overweight state, whose history and physical exam are concerning for obstructive sleep apnea (OSA). I had a long chat with the patient about my findings and the diagnosis of OSA, its prognosis and treatment options. We talked about medical treatments, surgical interventions and non-pharmacological approaches. I explained in particular the risks and ramifications of untreated moderate to severe OSA, especially with respect to developing cardiovascular disease down the Road, including congestive heart failure, difficult to treat hypertension, cardiac arrhythmias, or stroke. Even type 2 diabetes has, in part, been linked to untreated OSA. Symptoms of untreated OSA include daytime sleepiness, memory problems, mood irritability and mood disorder such as depression and anxiety, lack of energy, as well as recurrent headaches, especially morning headaches. We talked about trying to maintain a healthy lifestyle in general, as well as the importance of weight control. We also talked about the importance of good sleep hygiene. I recommended the following at this time: sleep study.   I explained the sleep test procedure to the patient and also outlined possible surgical and non-surgical treatment options of OSA, including the use of a custom-made dental device (which would require a referral to a specialist dentist or oral surgeon), upper airway surgical options,  such as traditional UPPP or a novel less invasive surgical option in the form of Inspire hypoglossal nerve stimulation (which would involve a referral to an ENT surgeon). I also explained the CPAP treatment option to the patient, who indicated that he would be willing to try  CPAP if the need arises. I explained the importance of being compliant with PAP treatment, not only for insurance purposes but primarily to improve His symptoms, and for the patient's long term health benefit, including to reduce His cardiovascular risks. I answered all his questions today and the patient was in agreement. I plan to see him back after the sleep study is completed and encouraged him to call with any interim questions, concerns, problems or updates.   Thank you very much for allowing me to participate in the care of this nice patient. If I can be of any further assistance to you please do not hesitate to call me at [phone].  Sincerely,   Star Age, MD, PhD

## 2019-06-01 NOTE — Patient Instructions (Signed)

## 2019-06-07 ENCOUNTER — Encounter: Payer: Self-pay | Admitting: Neurology

## 2019-06-07 ENCOUNTER — Encounter: Payer: Self-pay | Admitting: Family Medicine

## 2019-06-09 ENCOUNTER — Telehealth: Payer: Self-pay | Admitting: Neurology

## 2019-06-09 NOTE — Telephone Encounter (Signed)
I send pt my chart message.

## 2019-06-09 NOTE — Telephone Encounter (Signed)
Please call patient and advise him that I could not play the video or audio file he is sent me through my chart messaging.  Nevertheless, as discussed, we will proceed with a sleep study testing to evaluate him for underlying sleep apnea.

## 2019-06-16 MED FILL — GABAPENTIN 300 MG CAPSULE: 300 | 30 days supply | Qty: 90 | Fill #2

## 2019-06-16 MED FILL — LOSARTAN POTASSIUM 50 MG TA: 50 | 90 days supply | Qty: 90 | Fill #0

## 2019-06-28 ENCOUNTER — Ambulatory Visit (INDEPENDENT_AMBULATORY_CARE_PROVIDER_SITE_OTHER): Payer: 59 | Admitting: Neurology

## 2019-06-28 ENCOUNTER — Other Ambulatory Visit: Payer: Self-pay

## 2019-06-28 DIAGNOSIS — R519 Headache, unspecified: Secondary | ICD-10-CM

## 2019-06-28 DIAGNOSIS — R0681 Apnea, not elsewhere classified: Secondary | ICD-10-CM

## 2019-06-28 DIAGNOSIS — G4733 Obstructive sleep apnea (adult) (pediatric): Secondary | ICD-10-CM

## 2019-06-28 DIAGNOSIS — R351 Nocturia: Secondary | ICD-10-CM

## 2019-06-28 DIAGNOSIS — R0683 Snoring: Secondary | ICD-10-CM

## 2019-06-28 DIAGNOSIS — E663 Overweight: Secondary | ICD-10-CM

## 2019-06-30 ENCOUNTER — Encounter: Payer: Self-pay | Admitting: Neurology

## 2019-06-30 ENCOUNTER — Telehealth: Payer: Self-pay | Admitting: Neurology

## 2019-06-30 NOTE — Telephone Encounter (Signed)
I called pt. I advised pt that Dr. Rexene Alberts reviewed their sleep study results and found that pt has mild sleep apnea. Dr. Rexene Alberts recommends that pt starts auto CPAP. I reviewed PAP compliance expectations with the pt. Pt is agreeable to starting a CPAP. I advised pt that an order will be sent to a DME, [facility], and [facility] will call the pt within about one week after they file with the pt's insurance. [facility] will show the pt how to use the machine, fit for masks, and troubleshoot the CPAP if needed. A follow up appt was made for insurance purposes with Dr. Rexene Alberts on May 25,2021 at 8:30 am. Pt verbalized understanding to arrive 15 minutes early and bring their CPAP. A letter with all of this information in it will be mailed to the pt as a reminder. I verified with the pt that the address we have on file is correct. Pt verbalized understanding of results. Pt had no questions at this time but was encouraged to call back if questions arise. I have sent the order to [facility] and have received confirmation that they have received the order.

## 2019-06-30 NOTE — Telephone Encounter (Signed)
-----   Message from Star Age, MD sent at 06/30/2019  7:57 AM EDT ----- Patient referred by Dr. Erin Hearing, seen by me on 06/01/19, HST on 06/29/19.    Please call and notify the patient that the recent home sleep test showed obstructive sleep apnea. OSA is in the moderate range and I recommend treatment in the form of autoPAP, which means, that we don't have to bring him in for a sleep study with CPAP, but will let him start using a so called autoPAP machine at home, through a DME company (of his choice, or as per insurance requirement). The DME representative will educate him on how to use the machine, how to put the mask on, etc. I have placed an order in the chart. Please send referral, talk to patient, send report to referring MD. We will need a FU in sleep clinic for 10 weeks post-PAP set up, please arrange that with me or one of our NPs. Thanks,   Star Age, MD, PhD [facility])

## 2019-06-30 NOTE — Addendum Note (Signed)
Addended by: Star Age on: 06/30/2019 07:57 AM   Modules accepted: Orders

## 2019-06-30 NOTE — Procedures (Signed)
Patient Information     First Name: Justin Last Name: [name] ID: [id number]  Birth Date: [birth year] Age: 59 Gender:  Referring Provider: Lind Covert, MD BMI: 29.1 (W=181 lb, H=5' 6'')  Neck Circ.:  16 '' Epworth:  3/24   Sleep Study Information    Study Date: Jun 29, 2019 S/H/A Version: 001.001.001.001 / 4.1.1528 / 51  History:    59 year old man with a history of hypertension, leg pain, and overweight state, who reports snoring and witnessed apneas per wife's report. Summary & Diagnosis:     OSA Recommendations:     This home sleep test demonstrates moderate obstructive sleep apnea with a total AHI of 16.5/hour and O2 nadir of 84%. Treatment with positive airway pressure is recommended. This can be achieved in the form of autoPAP trial/titration at home. A full night CPAP titration study will help with proper treatment settings and mask fitting if needed. Alternative treatments may include weight loss along with avoidance of the supine sleep position, or an oral appliance, in appropriate candidates. Please note that untreated obstructive sleep apnea may carry additional perioperative morbidity. Patients with significant obstructive sleep apnea should receive perioperative PAP therapy and the surgeons and particularly the anesthesiologist should be informed of the diagnosis and the severity of the sleep disordered breathing. The patient should be cautioned not to drive, work at heights, or operate dangerous or heavy equipment when tired or sleepy. Review and reiteration of good sleep hygiene measures should be pursued with any patient. Other causes of the patient's symptoms, including circadian rhythm disturbances, an underlying mood disorder, medication effect and/or an underlying medical problem cannot be ruled out based on this test. Clinical correlation is recommended.  The patient and his referring provider will be notified of the test results. The patient will be seen in follow up in  sleep clinic at [facility].  I certify that I have reviewed the raw data recording prior to the issuance of this report in accordance with the standards of the American Academy of Sleep Medicine (AASM).  Star Age, MD, PhD [facility]) Diplomat, ABPN (Neurology and Sleep)             Sleep Summary  Oxygen Saturation Statistics   Start Study Time: End Study Time: Total Recording Time:          12:08:53 AM   8:00:19 AM   7 h, 51 min  Total Sleep Time % REM of Sleep Time:  6 h, 52 min  15.9    Mean: 93 Minimum: 84 Maximum: 98  Mean of Desaturations Nadirs (%):   92  Oxygen Desaturation. %:   4-9 10-20 >20 Total  Events Number Total    26  1 96.3 3.7  0 0.0  27 100.0  Oxygen Saturation: <90 <=88 <85 <80 <70  Duration (minutes): Sleep % 0.4 0.1  0.3 0.0  0.1 0.0 0.0 0.0 0.0 0.0     Respiratory Indices      Total Events REM NREM All Night  pRDI:  131  pAHI:  95 ODI:  27  pAHIc:  9  % CSR: 0.0 25.5 21.0 10.5 4.5 22.4 15.9 3.9 1.2 22.8 16.5 4.7 1.6       Pulse Rate Statistics during Sleep (BPM)      Mean: 62 Minimum: 50 Maximum: 107    Indices are calculated using technically valid sleep time of 5 h, 45 min. pRDI/pAHI are calculated using oxi desaturations ? 3%  Body  Position Statistics  Position Supine Prone Right Left Non-Supine  Sleep (min) 266.4 13.0 0.0 133.5 146.5  Sleep % 64.5 3.1 0.0 32.3 35.5  pRDI 26.9 N/A N/A 14.4 14.8  pAHI 19.5 N/A N/A 10.0 10.7  ODI 5.3 N/A N/A 3.3 3.6     Snoring Statistics Snoring Level (dB) >40 >50 >60 >70 >80 >Threshold (45)  Sleep (min) 123.0 14.0 3.0 0.0 0.0 30.0  Sleep % 29.8 3.4 0.7 0.0 0.0 7.3    Mean: 41 dB Sleep Stages Chart                         pAHI=16.5                                     Mild              Moderate                    Severe                                                 5              15                    30

## 2019-06-30 NOTE — Progress Notes (Signed)
Patient referred by Dr. Erin Hearing, seen by me on 06/01/19, HST on 06/29/19.    Please call and notify the patient that the recent home sleep test showed obstructive sleep apnea. OSA is in the moderate range and I recommend treatment in the form of autoPAP, which means, that we don't have to bring him in for a sleep study with CPAP, but will let him start using a so called autoPAP machine at home, through a DME company (of his choice, or as per insurance requirement). The DME representative will educate him on how to use the machine, how to put the mask on, etc. I have placed an order in the chart. Please send referral, talk to patient, send report to referring MD. We will need a FU in sleep clinic for 10 weeks post-PAP set up, please arrange that with me or one of our NPs. Thanks,   Star Age, MD, PhD [facility])

## 2019-06-30 NOTE — Telephone Encounter (Signed)
Called patient to discuss sleep study results. No answer at this time. Unable to LVM for the patient to call back due to mailbox being full.  I will send the patient a mychart message as well.

## 2019-08-24 ENCOUNTER — Telehealth: Payer: Self-pay

## 2019-08-24 ENCOUNTER — Other Ambulatory Visit: Payer: Self-pay

## 2019-08-24 NOTE — Telephone Encounter (Signed)
Attempted to call pt, [name] with daughter for pt to call back to change appt with MD to NP

## 2019-08-25 ENCOUNTER — Encounter: Payer: Self-pay | Admitting: Cardiology

## 2019-08-25 ENCOUNTER — Other Ambulatory Visit: Payer: Self-pay

## 2019-08-25 ENCOUNTER — Ambulatory Visit (INDEPENDENT_AMBULATORY_CARE_PROVIDER_SITE_OTHER): Payer: 59 | Admitting: Cardiology

## 2019-08-25 VITALS — BP 124/80 | HR 61 | Ht 66.0 in | Wt 180.0 lb

## 2019-08-25 DIAGNOSIS — I10 Essential (primary) hypertension: Secondary | ICD-10-CM

## 2019-08-25 DIAGNOSIS — R0789 Other chest pain: Secondary | ICD-10-CM | POA: Diagnosis not present

## 2019-08-25 DIAGNOSIS — E786 Lipoprotein deficiency: Secondary | ICD-10-CM

## 2019-08-25 DIAGNOSIS — R5382 Chronic fatigue, unspecified: Secondary | ICD-10-CM | POA: Diagnosis not present

## 2019-08-25 NOTE — Patient Instructions (Signed)
Medication Instructions:  Your physician recommends that you continue on your current medications as directed. Please refer to the Current Medication list given to you today.  *If you need a refill on your cardiac medications before your next appointment, please call your pharmacy*   Lab Work: None If you have labs (blood work) drawn today and your tests are completely normal, you will receive your results only by: . MyChart Message (if you have MyChart) OR . A paper copy in the mail If you have any lab test that is abnormal or we need to change your treatment, we will call you to review the results.   Testing/Procedures: Your physician has requested that you have an echocardiogram. Echocardiography is a painless test that uses sound waves to create images of your heart. It provides your doctor with information about the size and shape of your heart and how well your heart's chambers and valves are working. This procedure takes approximately one hour. There are no restrictions for this procedure.     Follow-Up: At [facility], you and your health needs are our priority.  As part of our continuing mission to provide you with exceptional heart care, we have created designated Provider Care Teams.  These Care Teams include your primary Cardiologist (physician) and Advanced Practice Providers (APPs -  Physician Assistants and Nurse Practitioners) who all work together to provide you with the care you need, when you need it.  We recommend signing up for the patient portal called "MyChart".  Sign up information is provided on this After Visit Summary.  MyChart is used to connect with patients for Virtual Visits (Telemedicine).  Patients are able to view lab/test results, encounter notes, upcoming appointments, etc.  Non-urgent messages can be sent to your provider as well.   To learn more about what you can do with MyChart, go to [email or web address].    Your next appointment:   1  year(s)  The format for your next appointment:   In Person  Provider:   Robert Krasowski, MD   Other Instructions   Echocardiogram An echocardiogram is a procedure that uses painless sound waves (ultrasound) to produce an image of the heart. Images from an echocardiogram can provide important information about:  Signs of coronary artery disease (CAD).  Aneurysm detection. An aneurysm is a weak or damaged part of an artery wall that bulges out from the normal force of blood pumping through the body.  Heart size and shape. Changes in the size or shape of the heart can be associated with certain conditions, including heart failure, aneurysm, and CAD.  Heart muscle function.  Heart valve function.  Signs of a past heart attack.  Fluid buildup around the heart.  Thickening of the heart muscle.  A tumor or infectious growth around the heart valves. Tell a health care provider about:  Any allergies you have.  All medicines you are taking, including vitamins, herbs, eye drops, creams, and over-the-counter medicines.  Any blood disorders you have.  Any surgeries you have had.  Any medical conditions you have.  Whether you are pregnant or may be pregnant. What are the risks? Generally, this is a safe procedure. However, problems may occur, including:  Allergic reaction to dye (contrast) that may be used during the procedure. What happens before the procedure? No specific preparation is needed. You may eat and drink normally. What happens during the procedure?   An IV tube may be inserted into one of your veins.  You may receive   contrast through this tube. A contrast is an injection that improves the quality of the pictures from your heart.  A gel will be applied to your chest.  A wand-like tool (transducer) will be moved over your chest. The gel will help to transmit the sound waves from the transducer.  The sound waves will harmlessly bounce off of your heart to  allow the heart images to be captured in real-time motion. The images will be recorded on a computer. The procedure may vary among health care providers and hospitals. What happens after the procedure?  You may return to your normal, everyday life, including diet, activities, and medicines, unless your health care provider tells you not to do that. Summary  An echocardiogram is a procedure that uses painless sound waves (ultrasound) to produce an image of the heart.  Images from an echocardiogram can provide important information about the size and shape of your heart, heart muscle function, heart valve function, and fluid buildup around your heart.  You do not need to do anything to prepare before this procedure. You may eat and drink normally.  After the echocardiogram is completed, you may return to your normal, everyday life, unless your health care provider tells you not to do that. This information is not intended to replace advice given to you by your health care provider. Make sure you discuss any questions you have with your health care provider. Document Revised: 07/16/2018 Document Reviewed: 04/27/2016 Elsevier Patient Education  2020 Elsevier Inc.   

## 2019-08-25 NOTE — Progress Notes (Signed)
Cardiology Office Note:    Date:  08/25/2019   ID:  [name], DOB 28-Apr-1960, MRN [id number]  PCP:  Lind Covert, MD  Cardiologist:  Jenne Campus, MD    Referring MD: Lind Covert, *   No chief complaint on file. I get short of breath easily but no chest pain  History of Present Illness:    [name] is a 59 y.o. male with past medical history significant for essential hypertension, obstructive sleep apnea, dyslipidemia.  Comes today to my office for of follow-up.  Overall he is doing well but complain of having difficulty exercising a lot because of shortness of breath.  There is no chest pain tightness squeezing pressure burning chest.  He complained of having left elbow pain when he tried to lift some heavy objects, he also complained of having difficulty sleeping.  He was find to have significant obstructive sleep apnea, however have difficulty using CPAP mask.  He tried to walk on the regular basis and have no difficulty doing it except for shortness of breath  Past Medical History:  Diagnosis Date  . Abdominal pain 2009   CT abd pelvis /chronic  . Allergy    seasonal  . Hypertension   . RECTAL PAIN 12/08/2007   Chronic - had evaluation by Dr Sharlett Iles in 2009 including EGD, colonscopy, biopsy and abdomen CT.   Repeat abdomen CT in 06/2010 also negative       Past Surgical History:  Procedure Laterality Date  . CIRCUMCISION     Foreskin still partially attached to glans  . COLONOSCOPY  2009   [place]  . HEMORRHOID SURGERY  2007    [place]  . UPPER GASTROINTESTINAL ENDOSCOPY  2009   Sharlett Iles    Current Medications: Current Meds  Medication Sig  . atorvastatin (LIPITOR) 10 MG tablet Take 1 tablet (10 mg total) by mouth daily.  . finasteride (PROPECIA) 1 MG tablet Take 1 mg by mouth daily.  Marland Kitchen gabapentin (NEURONTIN) 300 MG capsule Take 300 mg by mouth 3 (three) times daily.  Marland Kitchen losartan (COZAAR) 50 MG tablet Take 50 mg by mouth  daily.     Allergies:   Patient has no known allergies.   Social History   Socioeconomic History  . Marital status: Married    Spouse name: Not on file  . Number of children: Not on file  . Years of education: Not on file  . Highest education level: Not on file  Occupational History  . Not on file  Tobacco Use  . Smoking status: Former Smoker    Packs/day: 2.00    Years: 28.00    Pack years: 56.00    Types: Cigarettes    Quit date: 2004    Years since quitting: 17.3  . Smokeless tobacco: Never Used  . Tobacco comment: smoked for 28 yr- smoked up to 2-3ppd. quit smoking 45yr ago  Substance and Sexual Activity  . Alcohol use: No  . Drug use: No  . Sexual activity: Yes    Partners: Female  Other Topics Concern  . Not on file  Social History Narrative   [place];   Brother is a physician in middle [place]   Has several children near by   Works as a Geophysicist/field seismologist   No other real hobbies    April 2018   Social Determinants of Radio broadcast assistant Strain:   . Difficulty of Paying Living Expenses:   Food Insecurity:   .  Worried About Charity fundraiser in the Last Year:   . Arboriculturist in the Last Year:   Transportation Needs:   . Film/video editor (Medical):   Marland Kitchen Lack of Transportation (Non-Medical):   Physical Activity:   . Days of Exercise per Week:   . Minutes of Exercise per Session:   Stress:   . Feeling of Stress :   Social Connections:   . Frequency of Communication with Friends and Family:   . Frequency of Social Gatherings with Friends and Family:   . Attends Religious Services:   . Active Member of Clubs or Organizations:   . Attends Archivist Meetings:   Marland Kitchen Marital Status:      Family History: The patient's family history includes Brain cancer in his sister; Colon cancer in his sister; Diabetes in his brother, cousin, mother, and sister; Hypertension in his mother; Pneumonia in his brother; Sudden death in his father. ROS:    Please see the history of present illness.    All 14 point review of systems negative except as described per history of present illness  EKGs/Labs/Other Studies Reviewed:      Recent Labs: 05/20/2019: ALT 21; BUN 14; Creatinine, Ser 1.02; Hemoglobin 14.8; Platelets 255; Potassium 4.6; Sodium 139  Recent Lipid Panel    Component Value Date/Time   CHOL 200 (H) 01/27/2018 1030   TRIG 204 (H) 01/27/2018 1030   HDL 36 (L) 01/27/2018 1030   CHOLHDL 5.6 (H) 01/27/2018 1030   CHOLHDL 4.9 07/19/2013 1012   VLDL 26 07/19/2013 1012   LDLCALC 123 (H) 01/27/2018 1030    Physical Exam:    VS:  BP 124/80   Pulse 61   Ht 5\' 6"  (1.676 m)   Wt 180 lb (81.6 kg)   SpO2 98%   BMI 29.05 kg/m     Wt Readings from Last 3 Encounters:  08/25/19 180 lb (81.6 kg)  06/01/19 180 lb 5 oz (81.8 kg)  05/20/19 180 lb 3.2 oz (81.7 kg)     GEN:  Well nourished, well developed in no acute distress HEENT: Normal NECK: No JVD; No carotid bruits LYMPHATICS: No lymphadenopathy CARDIAC: RRR, no murmurs, no rubs, no gallops RESPIRATORY:  Clear to auscultation without rales, wheezing or rhonchi  ABDOMEN: Soft, non-tender, non-distended MUSCULOSKELETAL:  No edema; No deformity  SKIN: Warm and dry LOWER EXTREMITIES: no swelling NEUROLOGIC:  Alert and oriented x 3 PSYCHIATRIC:  Normal affect   ASSESSMENT:    1. Essential hypertension   2. Low HDL (under 40)   3. Atypical chest pain   4. Chronic fatigue    PLAN:    In order of problems listed above:  1. Essential hypertension blood pressure well controlled continue present management. 2. Dyslipidemia he is on Lipitor 10 I will continue present management.  Will call his primary care physician to get fasting lipid profile.  I did check his fasting lipid profile I have the results from 23 December of last year showing HDL of 44 and LDL of 101.  That is acceptable cholesterol for 4.  Atypical chest pain denies having any 3. Chronic fatigue with  dyspnea on exertion I will ask him to have an echocardiogram to check left ventricle ejection fraction.   Medication Adjustments/Labs and Tests Ordered: Current medicines are reviewed at length with the patient today.  Concerns regarding medicines are outlined above.  No orders of the defined types were placed in this encounter.  Medication changes: No  orders of the defined types were placed in this encounter.   Signed, Park Liter, MD, [facility] 08/25/2019 1:57 PM    [place]

## 2019-08-26 ENCOUNTER — Ambulatory Visit (INDEPENDENT_AMBULATORY_CARE_PROVIDER_SITE_OTHER): Payer: 59 | Admitting: Adult Health

## 2019-08-26 ENCOUNTER — Encounter: Payer: Self-pay | Admitting: Adult Health

## 2019-08-26 VITALS — BP 127/73 | HR 62 | Ht 66.0 in | Wt 181.0 lb

## 2019-08-26 DIAGNOSIS — Z9989 Dependence on other enabling machines and devices: Secondary | ICD-10-CM | POA: Diagnosis not present

## 2019-08-26 DIAGNOSIS — G4733 Obstructive sleep apnea (adult) (pediatric): Secondary | ICD-10-CM | POA: Diagnosis not present

## 2019-08-26 NOTE — Patient Instructions (Addendum)
Your Plan:  Continue current use of CPAP at same settings  Follow up with your DME company [employer] regarding mask refitting  If you continue to have difficutly tolerating may consider treatment with dental device or Inspire device     Follow up in 6 months or call earlier if needed     Thank you for coming to see [place] at [facility]. I hope we have been able to provide you high quality care today.  You may receive a patient satisfaction survey over the next few weeks. We would appreciate your feedback and comments so that we may continue to improve ourselves and the health of our patients.

## 2019-08-26 NOTE — Progress Notes (Addendum)
[facility] [address] [place]. [place] [address] [phone]       OFFICE FOLLOW UP NOTE  [name] Date of Birth:  18-Dec-1960 Medical Record Number:  [id number]   Reason for visit: Initial CPAP visit    SUBJECTIVE:   CHIEF COMPLAINT:  Chief Complaint  Patient presents with  . Follow-up    cpap fu, rm 9, alone, pt reports difficulty sleeping with cpap, states uncomfortable     HPI:   [name] is a 59 year old male with underlying medical history of HTN, fibromyalgia and overweight state who was evaluated by Dr. Rexene Alberts on 06/01/2019 for possible sleep apnea.  Underwent home sleep test on 06/28/2019 which showed moderate obstructive sleep apnea with total AHI of 16.5/h and O2 nadir of 84%.  Recommended initiating AutoPap for treatment.   CPAP compliance report from 07/26/2019 -08/24/2019 shows 29 out of 30 usage days with 29 days greater than 4 hours for 97% compliance.  Average usage 5 hours and 36 minutes.  Residual AHI 0.6.  Leaks in the 95th percentile 16.3 L/min.  Pressure in the 95th percentile 12.5 with min pressure 7 and max pressure 13 with a EPR level 3.  He reports mask being uncomfortable especially because he usually turns from side to side to fall asleep. He has not spoke to [place] yet about this concern.  Otherwise, he is tolerating pressure fine without difficulty.  He is planning to travel to [place] for 1 month in the near future and is questioning if he needs to bring CPAP machine.  No further concerns at this time    ROS:   14 system review of systems performed and negative with exception of apnea  PMH:  Past Medical History:  Diagnosis Date  . Abdominal pain 2009   CT abd pelvis /chronic  . Allergy    seasonal  . Hypertension   . RECTAL PAIN 12/08/2007   Chronic - had evaluation by Dr Sharlett Iles in 2009 including EGD, colonscopy, biopsy and abdomen CT.   Repeat abdomen CT in 06/2010 also negative       PSH:    Past Surgical History:  Procedure Laterality Date  . CIRCUMCISION     Foreskin still partially attached to glans  . COLONOSCOPY  2009   [facility]  . HEMORRHOID SURGERY  2007    [facility]  . UPPER GASTROINTESTINAL ENDOSCOPY  2009   Sharlett Iles    Social History:  Social History   Socioeconomic History  . Marital status: Married    Spouse name: Not on file  . Number of children: Not on file  . Years of education: Not on file  . Highest education level: Not on file  Occupational History  . Not on file  Tobacco Use  . Smoking status: Former Smoker    Packs/day: 2.00    Years: 28.00    Pack years: 56.00    Types: Cigarettes    Quit date: 2004    Years since quitting: 17.3  . Smokeless tobacco: Never Used  . Tobacco comment: smoked for 28 yr- smoked up to 2-3ppd. quit smoking 72yr ago  Substance and Sexual Activity  . Alcohol use: No  . Drug use: No  . Sexual activity: Yes    Partners: Female  Other Topics Concern  . Not on file  Social History Narrative   [place];   Brother is a Engineer, drilling in middle [place]   Has several children near by   Works  as a driver   No other real hobbies    April 2018   Social Determinants of Health   Financial Resource Strain:   . Difficulty of Paying Living Expenses:   Food Insecurity:   . Worried About Charity fundraiser in the Last Year:   . Arboriculturist in the Last Year:   Transportation Needs:   . Film/video editor (Medical):   Marland Kitchen Lack of Transportation (Non-Medical):   Physical Activity:   . Days of Exercise per Week:   . Minutes of Exercise per Session:   Stress:   . Feeling of Stress :   Social Connections:   . Frequency of Communication with Friends and Family:   . Frequency of Social Gatherings with Friends and Family:   . Attends Religious Services:   . Active Member of Clubs or Organizations:   . Attends Archivist Meetings:   Marland Kitchen Marital Status:   Intimate Partner Violence:   . Fear of  Current or Ex-Partner:   . Emotionally Abused:   Marland Kitchen Physically Abused:   . Sexually Abused:     Family History:  Family History  Problem Relation Age of Onset  . Diabetes Brother   . Sudden death Father   . Hypertension Mother   . Diabetes Mother   . Diabetes Sister   . Diabetes Cousin   . Colon cancer Sister   . Brain cancer Sister   . Pneumonia Brother     Medications:   Current Outpatient Medications on File Prior to Visit  Medication Sig Dispense Refill  . aspirin 81 MG EC tablet Take 81 mg by mouth daily.      . finasteride (PROPECIA) 1 MG tablet Take 1 mg by mouth daily.    Marland Kitchen gabapentin (NEURONTIN) 300 MG capsule Take 300 mg by mouth 3 (three) times daily.    Marland Kitchen losartan (COZAAR) 50 MG tablet Take 50 mg by mouth daily.    Marland Kitchen atorvastatin (LIPITOR) 10 MG tablet Take 1 tablet (10 mg total) by mouth daily. 90 tablet 1   No current facility-administered medications on file prior to visit.    Allergies:  No Known Allergies    OBJECTIVE:  Physical Exam  Vitals:   08/26/19 1010  BP: 127/73  Pulse: 62  Weight: 181 lb (82.1 kg)  Height: 5\' 6"  (1.676 m)   Body mass index is 29.21 kg/m. No exam data present  General: well developed, well nourished, pleasant middle-age male, seated, in no evident distress Head: head normocephalic and atraumatic.   Neck: supple with no carotid or supraclavicular bruits Cardiovascular: regular rate and rhythm, no murmurs Musculoskeletal: no deformity Skin:  no rash/petichiae Vascular:  Normal pulses all extremities   Neurologic Exam Mental Status: Awake and fully alert. Oriented to place and time. Recent and remote memory intact. Attention span, concentration and fund of knowledge appropriate. Mood and affect appropriate.  Cranial Nerves: Pupils equal, briskly reactive to light. Extraocular movements full without nystagmus. Visual fields full to confrontation. Hearing intact. Facial sensation intact. Face, tongue, palate moves  normally and symmetrically.  Motor: Normal bulk and tone. Normal strength in all tested extremity muscles. Sensory.: intact to touch , pinprick , position and vibratory sensation.  Coordination: Rapid alternating movements normal in all extremities. Finger-to-nose and heel-to-shin performed accurately bilaterally. Gait and Station: Arises from chair without difficulty. Stance is normal. Gait demonstrates normal stride length and balance Reflexes: 1+ and symmetric. Toes downgoing.  ASSESSMENT/PLAN: [name] is a 59 y.o. year old male with ongoing medical history of HLD and leg pain evaluated by Dr. Rexene Alberts for possible sleep apnea undergoing HST on 06/26/2019 which showed moderate sleep apnea with AutoPap initiated.  Here for initial compliance visit with satisfactory usage and optimal residual AHI of 0.6.  Complains of difficulty tolerating mask due to difficulty turning from side to side while trying to fall asleep  Advised to follow-up with DME company [employer] to discuss different mask options.  Continue current settings and discussion regarding importance of ongoing compliance.  If he continues to have difficulty tolerating, may consider pursuing dental device or hypoglossal nerve stimulator which was discussed with patient.  Advised that he should bring machine with him on vacation as he should be using at all times.  He is concerned regarding compatibility of CPAP plug and outlets and advised to speak to DME company further as he may be better able to assist.    Follow up in 6 months or call earlier if needed   I spent 25 minutes of face-to-face and non-face-to-face time with patient.  This included previsit chart review, lab review, study review, order entry, electronic health record documentation, patient education      Frann Rider, [facility]  [facility] [address] [place], [place] [address]  Phone [phone] Fax  [phone] Note: This document was prepared with digital dictation and possible smart phrase technology. Any transcriptional errors that result from this process are unintentional.    I reviewed the above note and documentation by the Nurse Practitioner and agree with the history, exam, assessment and plan as outlined above. I was available for consultation. Star Age, MD, PhD [facility])

## 2019-08-31 ENCOUNTER — Ambulatory Visit: Payer: Self-pay | Admitting: Neurology

## 2019-08-31 NOTE — Progress Notes (Signed)
Order for Cpap supplies sent to [employer] via community msg. Confirmation received that the order transmitted was successful. 

## 2019-09-07 ENCOUNTER — Encounter: Payer: Self-pay | Admitting: Family Medicine

## 2019-09-07 ENCOUNTER — Other Ambulatory Visit: Payer: Self-pay

## 2019-09-07 ENCOUNTER — Ambulatory Visit (INDEPENDENT_AMBULATORY_CARE_PROVIDER_SITE_OTHER): Payer: 59 | Admitting: Family Medicine

## 2019-09-07 DIAGNOSIS — G5793 Unspecified mononeuropathy of bilateral lower limbs: Secondary | ICD-10-CM

## 2019-09-07 DIAGNOSIS — M79672 Pain in left foot: Secondary | ICD-10-CM

## 2019-09-07 DIAGNOSIS — R350 Frequency of micturition: Secondary | ICD-10-CM

## 2019-09-07 DIAGNOSIS — S46212A Strain of muscle, fascia and tendon of other parts of biceps, left arm, initial encounter: Secondary | ICD-10-CM

## 2019-09-07 DIAGNOSIS — M79671 Pain in right foot: Secondary | ICD-10-CM | POA: Diagnosis not present

## 2019-09-07 NOTE — Patient Instructions (Signed)
Good to see you today!  Thanks for coming in.  For the feet pain.  Will refer you for nerve conduction studies.  They will contact you  For the Left arm.  You have torn one of the heads of your biceps muscle.  Nothing needs to be done  Urination  Keep a diary  Write down how much you drink - ounces - any liquid  How frequently you urinate - ounces  Do this for three days   Come back with your readings

## 2019-09-08 DIAGNOSIS — S46219A Strain of muscle, fascia and tendon of other parts of biceps, unspecified arm, initial encounter: Secondary | ICD-10-CM | POA: Insufficient documentation

## 2019-09-08 NOTE — Assessment & Plan Note (Signed)
Worsening.  Apparently never was seen by urology.  Will have him keep a dairy - see after visit summary

## 2019-09-08 NOTE — Progress Notes (Signed)
    SUBJECTIVE:   CHIEF COMPLAINT / HPI:   FOOT LEG PAIN This continues and is worsening.  Mostly at night but sometimes when sitting during the day.  No sores or lesions.  Gabapentin helps some but seems to be waning.  No lower extremity weakness  OSA He is working to wear the CPAP  L ARM SWELLING After lifting a heavy object felt a mild pain and now has a lump over his biceps.  Happened several months ago.  No distal weakness or loss of sensation  FREQUENT URINATION For a long time has to urinate frequently.  Does decrease if he restricts fluid   PERTINENT  PMH / PSH: seeing cardiology scheduled for echo   OBJECTIVE:   BP 126/74   Pulse 67   Ht 5\' 6"  (1.676 m)   Wt 182 lb 3.2 oz (82.6 kg)   SpO2 97%   BMI 29.41 kg/m   L Bicep - lump consistent with ruptured head.  Distal strenght and sensation is intact Feet - intact sensation to light touch.  No lesions.  Pulses and hair normal   ASSESSMENT/PLAN:   Foot pain, bilateral Worsening with gabapentin.  Will refer for to neurology for probable NCS  Increased frequency of urination Worsening.  Apparently never was seen by urology.  Will have him keep a dairy - see after visit summary   Biceps rupture, distal No affect on function.  Reassured.  No treatment needed     Lind Covert, MD [place]

## 2019-09-08 NOTE — Assessment & Plan Note (Signed)
No affect on function.  Reassured.  No treatment needed

## 2019-09-08 NOTE — Assessment & Plan Note (Signed)
Worsening with gabapentin.  Will refer for to neurology for probable NCS

## 2019-09-10 ENCOUNTER — Ambulatory Visit (HOSPITAL_BASED_OUTPATIENT_CLINIC_OR_DEPARTMENT_OTHER)
Admission: RE | Admit: 2019-09-10 | Discharge: 2019-09-10 | Disposition: A | Discharge status: Home / Self Care | Payer: 59 | Source: Ambulatory Visit | Attending: Cardiology | Admitting: Cardiology

## 2019-09-10 ENCOUNTER — Other Ambulatory Visit: Payer: Self-pay

## 2019-09-10 DIAGNOSIS — I10 Essential (primary) hypertension: Secondary | ICD-10-CM | POA: Insufficient documentation

## 2019-09-10 DIAGNOSIS — E786 Lipoprotein deficiency: Secondary | ICD-10-CM | POA: Diagnosis not present

## 2019-09-10 DIAGNOSIS — R0789 Other chest pain: Secondary | ICD-10-CM | POA: Diagnosis present

## 2019-10-28 ENCOUNTER — Ambulatory Visit (INDEPENDENT_AMBULATORY_CARE_PROVIDER_SITE_OTHER): Payer: 59 | Admitting: Family Medicine

## 2019-10-28 ENCOUNTER — Other Ambulatory Visit: Payer: Self-pay

## 2019-10-28 VITALS — BP 122/62 | HR 75 | Ht 67.0 in | Wt 184.1 lb

## 2019-10-28 DIAGNOSIS — K5904 Chronic idiopathic constipation: Secondary | ICD-10-CM

## 2019-10-28 DIAGNOSIS — R1031 Right lower quadrant pain: Secondary | ICD-10-CM | POA: Diagnosis not present

## 2019-10-28 DIAGNOSIS — R1084 Generalized abdominal pain: Secondary | ICD-10-CM

## 2019-10-28 DIAGNOSIS — R1032 Left lower quadrant pain: Secondary | ICD-10-CM | POA: Diagnosis not present

## 2019-10-28 LAB — POCT URINALYSIS DIP (CLINITEK)
Bilirubin, UA: NEGATIVE
Blood, UA: NEGATIVE
Glucose, UA: NEGATIVE mg/dL
Ketones, POC UA: NEGATIVE mg/dL
Leukocytes, UA: NEGATIVE
Nitrite, UA: NEGATIVE
POC PROTEIN,UA: NEGATIVE
Spec Grav, UA: 1.02 (ref 1.010–1.025)
Urobilinogen, UA: 0.2 E.U./dL
pH, UA: 6 (ref 5.0–8.0)

## 2019-10-28 MED ORDER — POLYETHYLENE GLYCOL 3350 17 G PO PACK: PACK | ORAL | 3 refills | Status: DC

## 2019-10-28 NOTE — Progress Notes (Signed)
    SUBJECTIVE:   CHIEF COMPLAINT / HPI:   [name] is a pleasant 59 year old gentleman with history of BPH and HTN presenting for abdominal pain. He has no other concerns today.  [name] reports a 6-7 day history of abdominal pain. This is crampy, unchanged by eating, and does not localize. It is located in each abdominal quadrant. Associated with constipation, which he has had 'on and off' for years. He reports a personal and strong family history of what he describes as irritable bowel disease. He reports in the past [place]friends [place] via mail). He took two tabs two days without relief. He denies nausea, lack of appetite, emesis, hematochezia, melena, dysuria, hematuria. He is eating normally but changed his diet in hopes of improving the pain (eating bread and honey). Drinking plenty of water. He has 1-2 bowel movements per day which he describes as pellets. Last colonoscopy in 03/1028 which demonstrated polyps, follow 3 years was recommended. Denies difficulty with urination or back pain. No other OTC medications.  He denies prior abdominal surgery. Has a history of hemorrhoid removal.    PERTINENT  PMH / PSH:  Reviewed and noted. Had CT in 2012 which was unremarkable. Has previously had issues with constipation.  BPH and HTN Non-smoker No aneurysm on prior CT   OBJECTIVE:   BP (!) 122/62   Pulse 75   Ht 5\' 7"  (1.702 m)   Wt 184 lb 2 oz (83.5 kg)   SpO2 97%   BMI 28.84 kg/m   Cardiac: Regular rate and rhythm. Normal S1/S2. No murmurs, rubs, or gallops appreciated. Lungs: Clear bilaterally to ascultation.  Abdomen: Normoactive bowel sounds. No tenderness to light palpation. Mild tenderness in LLQ on deep palpation, some tenderness along LUQ area well. No rebound or guarding.   Weight stable   ASSESSMENT/PLAN:   Abdominal pain Most likely related to constipation in the setting of dietary changes, prior reported history  of IBS. Recommended miralax twice per day for 3 days then titrate, he has found this helpful in the past. Discussed limited evidence for antibiotics and side effects, recommended he avoid. Discussed increasing dietary fibers, vegetables.  Also considered appendicitis, less likely given location, associated symptoms, still hungry, no emesis. Less likely AAA, kidney stone, or UTI given negative urine. H. Pylori could be considered but does not have reflux symptoms or epigastric pain. If persists, recommend consideration of colonoscopy earlier than 3 year mark as this could be considered a change in stool habits, will discuss with PCP.  Return precautions discussed and written on AVS.        [place], [place]

## 2019-10-28 NOTE — Patient Instructions (Addendum)
It was wonderful to see you today.  Please bring ALL of your medications with you to every visit.   Today we talked about:  - Your abdominal pain--- I think this is due to your constipation---I will talk with Dr. Erin Hearing about an early colonoscopy - Try Miralax twice per day for the three days then go to once per day   Thank you for choosing [facility].   Please call [phone] with any questions about today's appointment.  Please be sure to schedule follow up at the front  desk before you leave today.   Dorris Singh, MD  Family Medicine

## 2019-10-28 NOTE — Assessment & Plan Note (Signed)
Most likely related to constipation in the setting of dietary changes, prior reported history of IBS. Recommended miralax twice per day for 3 days then titrate, he has found this helpful in the past. Discussed limited evidence for antibiotics and side effects, recommended he avoid. Discussed increasing dietary fibers, vegetables.  Also considered appendicitis, less likely given location, associated symptoms, still hungry, no emesis. Less likely AAA, kidney stone, or UTI given negative urine. H. Pylori could be considered but does not have reflux symptoms or epigastric pain. If persists, recommend consideration of colonoscopy earlier than 3 year mark as this could be considered a change in stool habits, will discuss with PCP.  Return precautions discussed and written on AVS.

## 2019-11-01 ENCOUNTER — Institutional Professional Consult (permissible substitution): Payer: 59 | Admitting: Neurology

## 2019-12-14 ENCOUNTER — Encounter: Payer: Self-pay | Admitting: Neurology

## 2019-12-14 ENCOUNTER — Ambulatory Visit (INDEPENDENT_AMBULATORY_CARE_PROVIDER_SITE_OTHER): Payer: 59 | Admitting: Neurology

## 2019-12-14 ENCOUNTER — Other Ambulatory Visit: Payer: Self-pay

## 2019-12-14 VITALS — BP 132/80 | HR 55 | Ht 66.0 in | Wt 185.0 lb

## 2019-12-14 DIAGNOSIS — R200 Anesthesia of skin: Secondary | ICD-10-CM

## 2019-12-14 DIAGNOSIS — G4733 Obstructive sleep apnea (adult) (pediatric): Secondary | ICD-10-CM | POA: Diagnosis not present

## 2019-12-14 DIAGNOSIS — Z9989 Dependence on other enabling machines and devices: Secondary | ICD-10-CM

## 2019-12-14 DIAGNOSIS — R202 Paresthesia of skin: Secondary | ICD-10-CM

## 2019-12-14 NOTE — Progress Notes (Signed)
Subjective:    Patient ID: [name] is a 59 y.o. male.  HPI     Interim history:   Dear Dr. Erin Hearing,    I saw your patient, [name], upon your kind request in my Neurologic clinic today for initial consultation of his bilateral foot pain, concern for neuropathy.  The patient is unaccompanied today.  As you know, [name] is a 59 year old right-handed gentleman with an underlying medical history of hypertension, sleep apnea, allergies, and overweight state, who reports a several year history of paresthesias affecting both feet.  The symptoms are primarily at the bottom of both feet and include a painful sensation, not so much burning but a discomfort, also some degree of numbness, not complete numbness.  He has no symptoms in the upper extremities.  He has had fairly stable symptoms for years, he has been on gabapentin for 7 or 8 years with some decrease in discomfort noted.  He has seen a podiatrist.  He reports that he has had injections into both feet.  He used to run quite a bit, 2 to 3 miles per day, also running on the treadmill.  He is wondering if he has done some damage over time to his feet.  He denies any radiating back pain.  He denies any significant progression or upper extremity involvement.  He has not had any recent falls. No family history of neuropathy, no history of alcohol consumption, no chemotherapy history, no exposure to any known chemicals.  He was diagnosed with moderate obstructive sleep apnea with a home sleep test in March 2021.  He had a total AHI of 16.5, O2 nadir of 84%.  He saw a nurse practitioner, Frann Rider on 08/26/2019, at which time he was compliant with his AutoPap he was still struggling with tolerance.   As far as his AutoPap, he reports that he has done better with it.  Previously:   06/01/19: 59 year old right-handed gentleman with an underlying medical history of hypertension, leg pain, and overweight state, who reports snoring  and witnessed apneas per wife's report.  I reviewed her office note from 05/20/2019.  His Epworth sleepiness score is 3 out of 24.  He occasionally wakes up with a headache in the back of his head.  Sometimes he takes ibuprofen, he does have significant nocturia, 2-3 times per average night.  He has some difficulty falling asleep and staying asleep.  Bedtime is currently past midnight, rise time around 8.  He works as a Geophysicist/field seismologist, Clinical research associate.  He is a non-smoker and does not drink alcohol, drinks caffeine in the form of tea, about 3 cups/day, no pets in the home.  Lives with his wife and youngest child who is in middle school, he has 2 in college.  He has a TV in the bedroom but turns it off when he is ready to fall asleep.  His Past Medical History Is Significant For: Past Medical History:  Diagnosis Date  . Abdominal pain 2009   CT abd pelvis /chronic  . Allergy    seasonal  . Hypertension   . RECTAL PAIN 12/08/2007   Chronic - had evaluation by Dr Sharlett Iles in 2009 including EGD, colonscopy, biopsy and abdomen CT.   Repeat abdomen CT in 06/2010 also negative       His Past Surgical History Is Significant For: Past Surgical History:  Procedure Laterality Date  . CIRCUMCISION     Foreskin still partially attached to glans  . COLONOSCOPY  2009  [facility]  . HEMORRHOID SURGERY  2007    [facility]  . UPPER GASTROINTESTINAL ENDOSCOPY  2009   [facility]    His Family History Is Significant For: Family History  Problem Relation Age of Onset  . Diabetes Brother   . Sudden death Father   . Hypertension Mother   . Diabetes Mother   . Diabetes Sister   . Diabetes Cousin   . Colon cancer Sister   . Brain cancer Sister   . Pneumonia Brother     His Social History Is Significant For: Social History   Socioeconomic History  . Marital status: Married    Spouse name: Not on file  . Number of children: Not on file  . Years of education: Not on file  . Highest education level: Not on  file  Occupational History  . Not on file  Tobacco Use  . Smoking status: Former Smoker    Packs/day: 2.00    Years: 28.00    Pack years: 56.00    Types: Cigarettes    Quit date: 2004    Years since quitting: 17.6  . Smokeless tobacco: Never Used  . Tobacco comment: smoked for 28 yr- smoked up to 2-3ppd. quit smoking 30yr ago  Vaping Use  . Vaping Use: Never used  Substance and Sexual Activity  . Alcohol use: No  . Drug use: No  . Sexual activity: Yes    Partners: Female  Other Topics Concern  . Not on file  Social History Narrative   Born [name];   Brother is a physician in middle [place]   Has several children near by   Works as a Geophysicist/field seismologist   No other real hobbies    April 2018   Social Determinants of Health   Financial Resource Strain:   . Difficulty of Paying Living Expenses: Not on file  Food Insecurity:   . Worried About Charity fundraiser in the Last Year: Not on file  . Ran Out of Food in the Last Year: Not on file  Transportation Needs:   . Lack of Transportation (Medical): Not on file  . Lack of Transportation (Non-Medical): Not on file  Physical Activity:   . Days of Exercise per Week: Not on file  . Minutes of Exercise per Session: Not on file  Stress:   . Feeling of Stress : Not on file  Social Connections:   . Frequency of Communication with Friends and Family: Not on file  . Frequency of Social Gatherings with Friends and Family: Not on file  . Attends Religious Services: Not on file  . Active Member of Clubs or Organizations: Not on file  . Attends Archivist Meetings: Not on file  . Marital Status: Not on file    His Allergies Are:  No Known Allergies:   His Current Medications Are:  Outpatient Encounter Medications as of 12/14/2019  Medication Sig  . aspirin 81 MG EC tablet Take 81 mg by mouth daily.    . finasteride (PROPECIA) 1 MG tablet Take 1 mg by mouth daily.  Marland Kitchen gabapentin (NEURONTIN) 300 MG capsule Take 300 mg by mouth 3  (three) times daily.  Marland Kitchen losartan (COZAAR) 50 MG tablet Take 50 mg by mouth daily.  . polyethylene glycol (MIRALAX / GLYCOLAX) 17 g packet Mix 1 packet with 8 ounce of water and take twice per day for three days to titrate to soft stools  . atorvastatin (LIPITOR) 10 MG tablet Take 1 tablet (10  mg total) by mouth daily.   No facility-administered encounter medications on file as of 12/14/2019.  :  Review of Systems:  Out of a complete 14 point review of systems, all are reviewed and negative with the exception of these symptoms as listed below: Review of Systems  Neurological:       Here to discuss worsening pain/numbness in the bottom of his feet.  He is on gabapentin 300 mg tid and med has not helped with his sx. He also reports intermittent numbness of the left ear.    Objective:  Neurological Exam  Physical Exam Physical Examination:   Vitals:   12/14/19 1146  BP: 132/80  Pulse: (!) 55  SpO2: 97%    General Examination: The patient is a very pleasant 59 y.o. male in no acute distress. He appears well-developed and well-nourished and well groomed.   HEENT: Normocephalic, atraumatic, pupils are equal, round and reactive to light, extraocular tracking is good without limitation to gaze excursion or nystagmus noted. Hearing is grossly intact. Face is symmetric with normal facial animation. Speech is clear with no dysarthria noted. There is no hypophonia. There is no lip, neck/head, jaw or voice tremor. Neck is supple with full range of passive and active motion. There are no carotid bruits on auscultation. Oropharynx exam reveals: mild mouth dryness, adequate dental hygiene and moderate airway crowding.  Tongue protrudes centrally in palate elevates symmetrically.  Chest: Clear to auscultation without wheezing, rhonchi or crackles noted.  Heart: S1+S2+0, regular and normal without murmurs, rubs or gallops noted.   Abdomen: Soft, non-tender and non-distended with normal bowel  sounds appreciated on auscultation.  Extremities: There is no pitting edema in the distal lower extremities bilaterally.   Skin: Warm and dry without trophic changes noted.   Musculoskeletal: exam reveals no obvious joint deformities, tenderness or joint swelling or erythema.   Neurologically:  Mental status: The patient is awake, alert and oriented in all 4 spheres. His immediate and remote memory, attention, language skills and fund of knowledge are appropriate. There is no evidence of aphasia, agnosia, apraxia or anomia. Speech is clear with normal prosody and enunciation. Thought process is linear. Mood is normal and affect is normal.  Cranial nerves II - XII are as described above under HEENT exam.  Motor exam: Normal bulk, strength and tone is noted. There is no tremor, Romberg is negative. Fine motor skills and coordination: grossly intact.  Cerebellar testing: No dysmetria or intention tremor. There is no truncal or gait ataxia.  Heel-to-shin unremarkable bilaterally, finger-to-nose unremarkable bilaterally. Sensory exam: Intact to light touch, pinprick, vibration and temperature sense, no significant decrease in sensation in the distal lower extremities compared to upper extremities or more proximally.  Reflexes are 1+ in the upper extremities and trace in the knees, trace or possibly absent in both ankles.  Toes are downgoing bilaterally. Gait, station and balance: He stands easily. No veering to one side is noted. No leaning to one side is noted. Posture is age-appropriate and stance is narrow based. Gait shows normal stride length and normal pace. No problems turning are noted. Tandem walk is Challenging for him in the beginning but doable well afterwards.           Assessment and Plan:   In summary, [name] is a very pleasant 59 year old male with an underlying medical history of hypertension, sleep apnea, allergies, and overweight state, who presents for evaluation of  his discomfort affecting both feet of several years  duration, paresthesias reported and some numbness as well.  He has not had severe progression, denies any severe pain or complete numbness.  He has had some symptomatic benefit from gabapentin which is currently 300 mg 3 times daily.  He has been on gabapentin for 7 or 8 years.  His history is suggestive of neuropathy, exam is not telltale with any obvious discrepancies noted on examination.  Fine motor skills are good, balance is good.  He is reassured but is advised that we should look for any treatable causes of neuropathy and proceed with blood work today.  In addition, I would like to proceed with electrophysiological testing in the form of EMG nerve conduction velocity testing through our office.  He is advised to continue with his healthy lifestyle, stay well-hydrated, continue with his AutoPap therapy and we will call him with the test results and take it from there.  I did not suggest any new medications at this time.  He is agreeable with this approach.  We will schedule his EMG/nerve conduction velocity test through our office and call him as soon as we have blood test results available.  I answered all his questions today and he was in agreement.   Thank you very much for allowing me to participate in the care of this nice patient. If I can be of any further assistance to you please do not hesitate to call me at [phone].  Sincerely,   Star Age, MD, PhD

## 2019-12-14 NOTE — Patient Instructions (Signed)
You may have a condition called peripheral neuropathy, i. e. nerve damage. Unfortunately, as I mentioned, there is no specific treatment for most neuropathies. The most common cause for neuropathy is diabetes in this [place], in which case, tight glucose control is key.  Some studies suggest that obesity and prediabetes can also cause nerve damage even in the absence of a formal diagnosis of diabetes.    Other causes include thyroid disease, and some vitamin deficiencies. Certain medications such as chemotherapy agents and other chemicals or toxins including alcohol can cause neuropathy. There are some genetic conditions or hereditary neuropathies. Typically patients will report a family history of neuropathy in those conditions. There are cases associated with cancers and autoimmune conditions. Most neuropathies are progressive unless a root cause can be found and treated, which is rare, as I explained. For most neuropathies there is no actual cure or reversing of symptoms. Painful neuropathy can be difficult to treat symptomatically, but there are some medications available to ease the symptoms.  Thankfully, you have no severe pain and have had some relief from the gabapentin.   Electrophysiologic testing with nerve conduction velocity studies and EMG (muscle testing) do not always pick up neuropathies that affect the smallest fibers. Other common tests include different type of blood work, and rarely, spinal fluid testing, and sometimes we resort to asking for a nerve and muscle biopsy.  We can also consider specialist input from a neuromuscular specialist, sometimes we make referrals to [facility] or [facility] or [facility].  For now, as discussed, we will proceed with further work-up from my end of things: We will check blood work today and call you with the test results.  We will do an EMG and nerve conduction velocity test, which is an electrical nerve and muscle test, which we will schedule. We  will call you with the results.

## 2019-12-16 MED FILL — FINASTERIDE 1 MG TABLET: 1 | 90 days supply | Qty: 90 | Fill #2

## 2019-12-17 MED FILL — ATORVASTATIN CALCIUM 10 MG: 10 | 30 days supply | Qty: 30 | Fill #0

## 2019-12-17 MED FILL — LOSARTAN POTASSIUM 50 MG TA: 50 | 30 days supply | Qty: 30 | Fill #0

## 2019-12-19 LAB — MULTIPLE MYELOMA PANEL, SERUM
Albumin SerPl Elph-Mcnc: 4.2 g/dL (ref 2.9–4.4)
Albumin/Glob SerPl: 1.5 (ref 0.7–1.7)
Alpha 1: 0.2 g/dL (ref 0.0–0.4)
Alpha2 Glob SerPl Elph-Mcnc: 0.6 g/dL (ref 0.4–1.0)
B-Globulin SerPl Elph-Mcnc: 1.2 g/dL (ref 0.7–1.3)
Gamma Glob SerPl Elph-Mcnc: 0.9 g/dL (ref 0.4–1.8)
Globulin, Total: 2.9 g/dL (ref 2.2–3.9)
IgA/Immunoglobulin A, Serum: 278 mg/dL (ref 90–386)
IgG (Immunoglobin G), Serum: 926 mg/dL (ref 603–1613)
IgM (Immunoglobulin M), Srm: 53 mg/dL (ref 20–172)
Total Protein: 7.1 g/dL (ref 6.0–8.5)

## 2019-12-19 LAB — C-REACTIVE PROTEIN: CRP: <1 mg/L (ref 0–10)

## 2019-12-19 LAB — RHEUMATOID FACTOR: Rheumatoid fact SerPl-aCnc: <10 IU/mL (ref 0.0–13.9)

## 2019-12-19 LAB — HGB A1C W/O EAG: Hgb A1c MFr Bld: 6.4 % — ABNORMAL HIGH (ref 4.8–5.6)

## 2019-12-19 LAB — TSH: TSH: 1.13 u[IU]/mL (ref 0.450–4.500)

## 2019-12-19 LAB — VITAMIN B6: Vitamin B6: 7.3 ug/L (ref 5.3–46.7)

## 2019-12-19 LAB — HEAVY METALS PROFILE II, BLOOD
Arsenic: <1 ug/L — ABNORMAL LOW (ref 2–23)
Cadmium: <0.5 ug/L (ref 0.0–1.2)
Lead, Blood: <1 ug/dL (ref 0–4)
Mercury: <1 ug/L (ref 0.0–14.9)

## 2019-12-19 LAB — SEDIMENTATION RATE: Sed Rate: 2 mm/hr (ref 0–30)

## 2019-12-19 LAB — ANA W/REFLEX: Anti Nuclear Antibody (ANA): NEGATIVE

## 2019-12-19 LAB — CK: Total CK: 131 U/L (ref 41–331)

## 2019-12-20 ENCOUNTER — Telehealth: Payer: Self-pay

## 2019-12-20 NOTE — Telephone Encounter (Signed)
I called pt. No answer, left a message asking pt to call me back.   

## 2019-12-20 NOTE — Telephone Encounter (Signed)
-----   Message from Star Age, MD sent at 12/20/2019  1:10 PM EDT ----- Please call patient and advise him that his labs were fine with the exception of his hemoglobin A1c, which is a marker for diabetes.  It was in the upper limit of prediabetes, above that he would be considered diabetic.  Please advise him that prediabetes has been associated with nerve damage or "neuropathy" and could therefore be an explanation for his longstanding symptoms concerning for neuropathy.  He is advised to follow-up with his primary care physician regarding management of prediabetes.  We will proceed with electrical nerve and muscle testing through our office as scheduled.

## 2019-12-20 NOTE — Progress Notes (Signed)
Please call patient and advise him that his labs were fine with the exception of his hemoglobin A1c, which is a marker for diabetes.  It was in the upper limit of prediabetes, above that he would be considered diabetic.  Please advise him that prediabetes has been associated with nerve damage or "neuropathy" and could therefore be an explanation for his longstanding symptoms concerning for neuropathy.  He is advised to follow-up with his primary care physician regarding management of prediabetes.  We will proceed with electrical nerve and muscle testing through our office as scheduled.

## 2019-12-21 NOTE — Telephone Encounter (Signed)
I called the pt and we were able to discuss lab findings. Report has been sent to PCP and pt will keep never conduction study as scheduled for October with our office.

## 2019-12-29 ENCOUNTER — Telehealth: Payer: Self-pay

## 2019-12-29 NOTE — Telephone Encounter (Signed)
Patient calls nurse line reporting a positive covid test today. Patient started feeling "bad" on Sunday, mostly regular cold like symptoms. Patient is fully vaccinated. Patient advised conservative measures and CDC quidelines for quarantine were given, as well as ED precautions.

## 2020-01-13 ENCOUNTER — Encounter (INDEPENDENT_AMBULATORY_CARE_PROVIDER_SITE_OTHER): Payer: 59 | Admitting: Diagnostic Neuroimaging

## 2020-01-13 ENCOUNTER — Other Ambulatory Visit: Payer: Self-pay

## 2020-01-13 ENCOUNTER — Ambulatory Visit (INDEPENDENT_AMBULATORY_CARE_PROVIDER_SITE_OTHER): Payer: 59 | Admitting: Diagnostic Neuroimaging

## 2020-01-13 DIAGNOSIS — Z0289 Encounter for other administrative examinations: Secondary | ICD-10-CM

## 2020-01-13 DIAGNOSIS — R202 Paresthesia of skin: Secondary | ICD-10-CM

## 2020-01-13 DIAGNOSIS — R200 Anesthesia of skin: Secondary | ICD-10-CM

## 2020-01-13 NOTE — Progress Notes (Signed)
Please call and advise the patient that the recent EMG and nerve conduction velocity test, which is the electrical nerve and muscle test we we performed, was reported as within normal limits. We checked for abnormal electrical discharges in the muscles or nerves and the report suggested normal findings. No further action is required on this test at this time. Please remind patient to keep any upcoming appointments or tests and to call us with any interim questions, concerns, problems or updates.  We can continue to monitor his symptoms and his exam. Thanks,  Star Age, MD, PhD

## 2020-01-13 NOTE — Procedures (Signed)
[facility]  NCS (NERVE CONDUCTION STUDY) WITH EMG (ELECTROMYOGRAPHY) REPORT   STUDY DATE: 01/13/20 PATIENT NAME: [name] DOB: [birth year] MRN: [id number]  ORDERING CLINICIAN: Star Age, MD PhD   TECHNOLOGIST: Sherre Scarlet ELECTROMYOGRAPHER: Earlean Polka. Kristofer Schaffert, MD  CLINICAL INFORMATION: 58 year old male with bilateral leg numbness.   FINDINGS: NERVE CONDUCTION STUDY: Bilateral tibial and motor responses are normal.  Bilateral sural and superficial peroneal sensory responses are normal.  Bilateral tibial F wave latencies are normal.   NEEDLE ELECTROMYOGRAPHY:  Needle examination of left lower extremity is normal.    IMPRESSION:   This is a normal study.  No electrodiagnostic evidence of large fiber neuropathy at this time.    INTERPRETING PHYSICIAN:  Penni Bombard, MD Certified in Neurology, Neurophysiology and Neuroimaging  [facility] [address], [place], [place] [address] [phone]  [facility]    Nerve / Sites Muscle Latency Ref. Amplitude Ref. Rel Amp Segments Distance Velocity Ref. Area    ms ms mV mV %  cm m/s m/s mVms  L Peroneal - EDB     Ankle EDB 4.6 ?6.5 5.2 ?2.0 100 Ankle - EDB 9   15.3     Fib head EDB 10.8  4.4  84.6 Fib head - Ankle 29 47 ?44 14.2     Pop fossa EDB 12.9  4.1  93.8 Pop fossa - Fib head 10 47 ?44 13.8         Pop fossa - Ankle      R Peroneal - EDB     Ankle EDB 4.4 ?6.5 7.7 ?2.0 100 Ankle - EDB 9   19.1     Fib head EDB 10.6  6.3  81.4 Fib head - Ankle 29 47 ?44 17.8     Pop fossa EDB 12.7  6.1  96.6 Pop fossa - Fib head 10 47 ?44 18.2         Pop fossa - Ankle      L Tibial - AH     Ankle AH 5.1 ?5.8 11.4 ?4.0 100 Ankle - AH 9   21.6     Pop fossa AH 13.6  9.4  83 Pop fossa - Ankle 38 45 ?41 19.9  R Tibial - AH     Ankle AH 5.6 ?5.8 11.8 ?4.0 100 Ankle - AH 9   23.2     Pop fossa AH 14.2  10.3  87.2 Pop fossa - Ankle 39 45 ?41 24.0             SNC    Nerve /  Sites Rec. Site Peak Lat Ref.  Amp Ref. Segments Distance    ms ms V V  cm  L Sural - Ankle (Calf)     Calf Ankle 4.4 ?4.4 10 ?6 Calf - Ankle 14  R Sural - Ankle (Calf)     Calf Ankle 4.1 ?4.4 12 ?6 Calf - Ankle 14  L Superficial peroneal - Ankle     Lat leg Ankle 3.7 ?4.4 6 ?6 Lat leg - Ankle 14  R Superficial peroneal - Ankle     Lat leg Ankle 4.1 ?4.4 7 ?6 Lat leg - Ankle 14             F  Wave    Nerve F Lat Ref.   ms ms  L Tibial - AH 51.9 ?56.0  R Tibial - AH 52.2 ?56.0  EMG Summary Table    Spontaneous MUAP Recruitment  Muscle IA Fib PSW Fasc Other Amp Dur. Poly Pattern  L. Vastus medialis Normal None None None _______ Normal Normal Normal Normal  L. Tibialis anterior Normal None None None _______ Normal Normal Normal Normal  L. Gastrocnemius (Medial head) Normal None None None _______ Normal Normal Normal Normal

## 2020-01-17 ENCOUNTER — Telehealth: Payer: Self-pay

## 2020-01-17 NOTE — Telephone Encounter (Signed)
I called the pt and advised of results via vm( ok per dpr) advising of results.  Pt advised to call back if he had any questions and to keep November appt as scheduled.

## 2020-01-17 NOTE — Telephone Encounter (Signed)
-----   Message from Star Age, MD sent at 01/13/2020  5:55 PM EDT ----- Please call and advise the patient that the recent EMG and nerve conduction velocity test, which is the electrical nerve and muscle test we we performed, was reported as within normal limits. We checked for abnormal electrical discharges in the muscles or nerves and the report suggested normal findings. No further action is required on this test at this time. Please remind patient to keep any upcoming appointments or tests and to call us with any interim questions, concerns, problems or updates.  We can continue to monitor his symptoms and his exam. Thanks,  Star Age, MD, PhD

## 2020-01-20 ENCOUNTER — Other Ambulatory Visit (HOSPITAL_COMMUNITY): Payer: Self-pay | Admitting: Family Medicine

## 2020-01-20 ENCOUNTER — Other Ambulatory Visit: Payer: Self-pay

## 2020-01-20 MED ORDER — LOSARTAN POTASSIUM 50 MG PO TABS
50.0000 mg | ORAL_TABLET | Freq: Every day | ORAL | 1 refills | Status: DC
Start: 2020-01-20 — End: 2020-11-08

## 2020-01-20 MED FILL — LOSARTAN POTASSIUM 50 MG TA: 50 | 90 days supply | Qty: 90 | Fill #0

## 2020-01-20 NOTE — Telephone Encounter (Signed)
Patient calls nurse line requesting urgent refill on BP medication. Patient states that he has been out of medication for the last few days. Patient is now complaining of headache, slight chest pain and left arm pain. Advised patient due to his symptoms that ED evaluation would be indicated. Patient declines at this time stating that he is recovering from [place] and that is what the pain is coming from. Will send urgent message to Dr. Erin Hearing regarding this matter. ED precautions reinforced.   To PCP  Talbot Grumbling, RN

## 2020-01-26 ENCOUNTER — Other Ambulatory Visit: Payer: Self-pay

## 2020-01-26 ENCOUNTER — Ambulatory Visit (INDEPENDENT_AMBULATORY_CARE_PROVIDER_SITE_OTHER): Payer: 59 | Admitting: Family Medicine

## 2020-01-26 ENCOUNTER — Encounter: Payer: Self-pay | Admitting: Family Medicine

## 2020-01-26 ENCOUNTER — Other Ambulatory Visit (HOSPITAL_COMMUNITY): Payer: Self-pay | Admitting: Family Medicine

## 2020-01-26 DIAGNOSIS — R5382 Chronic fatigue, unspecified: Secondary | ICD-10-CM | POA: Diagnosis not present

## 2020-01-26 DIAGNOSIS — G4700 Insomnia, unspecified: Secondary | ICD-10-CM

## 2020-01-26 DIAGNOSIS — R350 Frequency of micturition: Secondary | ICD-10-CM

## 2020-01-26 MED ORDER — GABAPENTIN 100 MG PO CAPS: 200.0000 mg | ORAL_CAPSULE | Freq: Every day | ORAL | 0 refills | Status: DC

## 2020-01-26 MED FILL — GABAPENTIN 100 MG CAPSULE: 100 | 30 days supply | Qty: 60 | Fill #0

## 2020-01-26 NOTE — Assessment & Plan Note (Signed)
Worsened.  Will refer to follow up with already established urologist

## 2020-01-26 NOTE — Patient Instructions (Addendum)
Good to see you today!  Thanks for coming in.  I will call you if your tests are not good.  Otherwise, I will send you a message on MyChart (if it is active) or a letter in the mail..  If you do not hear from me with in 2 weeks please call our office.    I would strongly advise exercise.   Start walking about 5-10 minutes every day move up to 30 minutes a day  Gabapentin take 200 mg at night for one week then cut down to 100 mg at night for one week then stop.  I will refer you to urology - they should contact you in 1-2 weeks  Come back in 3 weeks to see how things are going

## 2020-01-26 NOTE — Assessment & Plan Note (Signed)
Likely partially related to stopping gabapentin.  Will restart and wean (see after visit summary)

## 2020-01-26 NOTE — Assessment & Plan Note (Signed)
Post covid.  Likely due to residua from a viral infection.  Will check labs for liver or electrolyte problems or anemia.  Encourage resumption of exercise gradually

## 2020-01-26 NOTE — Progress Notes (Signed)
    SUBJECTIVE:   CHIEF COMPLAINT / HPI:   FATIGUE Had covid one month ago and since has been tired and listless.  Had been vaccinated and did not go to hospital.  Currently no shortness of breath or cough or fever or weight loss or adenopathy or abdomen pain.  Not exercising  FREQUENT URINATION Has had for years but recently over months worsened.  Interferes with his sleep getting up frequently to urinate small amounts.  No pain or bleeding.  Has seen Urology in past   INSOMNIA Not sleeping well since abruptly stopped his gabapentin 300 mg at night.  Ear Numbness   PERTINENT  PMH / PSH: seeing neurology for work up of neuropathy negative so far  OBJECTIVE:   BP 134/80   Pulse 71   Wt 186 lb 3.2 oz (84.5 kg)   SpO2 97%   BMI 30.05 kg/m   Heart - Regular rate and rhythm.  No murmurs, gallops or rubs.    Lungs:  Normal respiratory effort, chest expands symmetrically. Lungs are clear to auscultation, no crackles or wheezes. Neck:  No deformities, thyromegaly, masses, or tenderness noted.   Supple with full range of motion without pain. Abdomen: soft and non-tender without masses, organomegaly or hernias noted.  No guarding or rebound Extremities:  No cyanosis, edema, or deformity noted with good range of motion of all major joints.     ASSESSMENT/PLAN:   Fatigue Post covid.  Likely due to residua from a viral infection.  Will check labs for liver or electrolyte problems or anemia.  Encourage resumption of exercise gradually   Increased frequency of urination Worsened.  Will refer to follow up with already established urologist   Insomnia Likely partially related to stopping gabapentin.  Will restart and wean (see after visit summary)      Lind Covert, [place]

## 2020-01-27 LAB — CMP14+EGFR
ALT: 24 IU/L (ref 0–44)
AST: 16 IU/L (ref 0–40)
Albumin/Globulin Ratio: 2 (ref 1.2–2.2)
Albumin: 4.5 g/dL (ref 3.8–4.9)
Alkaline Phosphatase: 51 IU/L (ref 44–121)
BUN/Creatinine Ratio: 9 (ref 9–20)
BUN: 10 mg/dL (ref 6–24)
Bilirubin Total: 0.2 mg/dL (ref 0.0–1.2)
CO2: 24 mmol/L (ref 20–29)
Calcium: 9.5 mg/dL (ref 8.7–10.2)
Chloride: 104 mmol/L (ref 96–106)
Creatinine, Ser: 1.08 mg/dL (ref 0.76–1.27)
GFR calc Af Amer: 86 mL/min/{1.73_m2} (ref >59)
GFR calc non Af Amer: 75 mL/min/{1.73_m2} (ref >59)
Globulin, Total: 2.3 g/dL (ref 1.5–4.5)
Glucose: 89 mg/dL (ref 65–99)
Potassium: 4.1 mmol/L (ref 3.5–5.2)
Sodium: 137 mmol/L (ref 134–144)
Total Protein: 6.8 g/dL (ref 6.0–8.5)

## 2020-01-27 LAB — CBC
Hematocrit: 42.5 % (ref 37.5–51.0)
Hemoglobin: 14.2 g/dL (ref 13.0–17.7)
MCH: 28.6 pg (ref 26.6–33.0)
MCHC: 33.4 g/dL (ref 31.5–35.7)
MCV: 86 fL (ref 79–97)
Platelets: 277 10*3/uL (ref 150–450)
RBC: 4.97 x10E6/uL (ref 4.14–5.80)
RDW: 13.6 % (ref 11.6–15.4)
WBC: 6.7 10*3/uL (ref 3.4–10.8)

## 2020-02-22 ENCOUNTER — Other Ambulatory Visit: Payer: Self-pay

## 2020-02-22 ENCOUNTER — Encounter: Payer: Self-pay | Admitting: Family Medicine

## 2020-02-22 ENCOUNTER — Ambulatory Visit (INDEPENDENT_AMBULATORY_CARE_PROVIDER_SITE_OTHER): Payer: 59 | Admitting: Family Medicine

## 2020-02-22 ENCOUNTER — Other Ambulatory Visit (HOSPITAL_COMMUNITY): Payer: Self-pay | Admitting: Family Medicine

## 2020-02-22 VITALS — BP 136/74 | HR 86 | Wt 188.8 lb

## 2020-02-22 DIAGNOSIS — Z23 Encounter for immunization: Secondary | ICD-10-CM | POA: Diagnosis not present

## 2020-02-22 DIAGNOSIS — R5382 Chronic fatigue, unspecified: Secondary | ICD-10-CM

## 2020-02-22 DIAGNOSIS — H9202 Otalgia, left ear: Secondary | ICD-10-CM | POA: Insufficient documentation

## 2020-02-22 DIAGNOSIS — G4700 Insomnia, unspecified: Secondary | ICD-10-CM

## 2020-02-22 MED ORDER — ATORVASTATIN CALCIUM 10 MG PO TABS: 10.0000 mg | ORAL_TABLET | Freq: Every day | ORAL | 1 refills | Status: DC

## 2020-02-22 MED FILL — ATORVASTATIN CALCIUM 10 MG: 10 | 90 days supply | Qty: 90 | Fill #0

## 2020-02-22 NOTE — Assessment & Plan Note (Signed)
Improved now off gabapentin

## 2020-02-22 NOTE — Assessment & Plan Note (Signed)
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  No signs of infection or mass lesion.  Try topical capsacin and monitor.  He wanted me to mention to his neurologist Dr Rexene Alberts

## 2020-02-22 NOTE — Patient Instructions (Signed)
Good to see you today!  Thanks for coming in.  For the Ear - if getting worse of especially if you have any sores or blisters let us know immediately - Try zostrix capsacin - OTC use twice a day a day around the ear - Do NOT get in your eye  Should decrease the tingling in a few weeks I will send Dr Rexene Alberts a message  Increase exercise to 40 minutes every day  Take the lipitor every day

## 2020-02-22 NOTE — Progress Notes (Signed)
    SUBJECTIVE:   CHIEF COMPLAINT / HPI:   FATIGUE Is improved after weaned off gabapentin.  Is sleeping some better  LEFT EAR NUMBNESS Has had for years he thnks since had a colonoscopy.  No pain but a tingling sensation that bothers him in the evening.  No skin changes or hearing changes or swelling   PERTINENT  PMH / PSH: Has an appointment to follow up with urology for urgency  OBJECTIVE:   BP 136/74   Pulse 86   Wt 188 lb 12.8 oz (85.6 kg)   SpO2 97%   BMI 30.47 kg/m   L Ear - normal exam internal and external with normal skin.   Ears:  External ear exam shows no significant lesions or deformities.  Otoscopic examination reveals clear canals, tympanic membranes are intact bilaterally without bulging, retraction, inflammation or discharge. Hearing is grossly normal bilaterall   ASSESSMENT/PLAN:   Insomnia Improved now off gabapentin  Fatigue Improved with normal lab workup, normal weight.  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  No signs of infection or mass lesion.  Try topical capsacin and monitor.  He wanted me to mention to his neurologist Dr Rexene Alberts      Lind Covert, [place]

## 2020-02-22 NOTE — Assessment & Plan Note (Signed)
Improved with normal lab workup, normal weight.  Likely due to insomnia

## 2020-02-28 ENCOUNTER — Ambulatory Visit (INDEPENDENT_AMBULATORY_CARE_PROVIDER_SITE_OTHER): Payer: 59 | Admitting: Adult Health

## 2020-02-28 ENCOUNTER — Encounter: Payer: Self-pay | Admitting: Adult Health

## 2020-02-28 VITALS — BP 138/78 | HR 88 | Ht 66.0 in | Wt 186.0 lb

## 2020-02-28 DIAGNOSIS — G4733 Obstructive sleep apnea (adult) (pediatric): Secondary | ICD-10-CM | POA: Diagnosis not present

## 2020-02-28 DIAGNOSIS — R519 Headache, unspecified: Secondary | ICD-10-CM

## 2020-02-28 DIAGNOSIS — Z789 Other specified health status: Secondary | ICD-10-CM | POA: Diagnosis not present

## 2020-02-28 DIAGNOSIS — R6889 Other general symptoms and signs: Secondary | ICD-10-CM

## 2020-02-28 DIAGNOSIS — R29818 Other symptoms and signs involving the nervous system: Secondary | ICD-10-CM

## 2020-02-28 DIAGNOSIS — G8929 Other chronic pain: Secondary | ICD-10-CM

## 2020-02-28 NOTE — Progress Notes (Signed)
[facility] [address] [place]. [place] [address] [phone]       OFFICE FOLLOW UP NOTE  [name] Date of Birth:  11-12-1960 Medical Record Number:  [id number]   Reason for visit: CPAP visit [facility] provider: Dr. Rexene Alberts   SUBJECTIVE:   CHIEF COMPLAINT:  Chief Complaint  Patient presents with  . Follow-up    rm 9, alone, Pt currently not using mask due to nasal congestion     HPI:   [name] is a 59 year old male with underlying medical history of HTN, fibromyalgia and overweight state who was evaluated by Dr. Rexene Alberts on 06/01/2019 for possible sleep apnea.  Underwent home sleep test on 06/28/2019 which showed moderate obstructive sleep apnea with total AHI of 16.5/h and O2 nadir of 84%.  Recommended initiating AutoPap for treatment.  Also evaluated on 12/14/2019 by Dr. Rexene Alberts for paresthesias of BLE.  Previously seen for CPAP compliance visit on 08/26/2019 with difficulty tolerating mask.  He does report improvement of mask tolerance.  He reports recent difficulty tolerating mask due to nasal congestion and increased mucous.  He has not used since 11/14. He questions possible further treatment options as he has had continued difficulty tolerating CPAP. CPAP compliance report from 01/28/2020 -02/26/2020 showed 23 out of 30 usage days for 77% compliance in 13 days greater than 4 hours for 43% compliance. Average usage 4 hours and 13 minutes. Residual AHI 0.7 with min pressure 7 and max pressure 13 and EPR level 3. Leaks in the 95th percentile 1.0 and pressure in the 95th percentile 11.5.   In regards to BLE paresthesias, lab work largely unremarkable except elevated A1c at 6.4. Per Dr. Rexene Alberts, this could potentially be explanation for his longstanding symptoms of neuropathy.  NCV/EMG 01/13/2020 unremarkable.  He has since stopped gabapentin per PCP due to lack of benefit as he has been on over the past 10 years. These have been stable without  worsening.  He also reports left ear numbness which has been present over the past 3 years. Numbness typically around the outer portion of ear and at times can radiate down mid neck area. Denies associated nerve pain. He will attempt to pull on ear lobe and message and this will help. Denies tinnitus, inner ear pain or hearing loss. Denies vertigo or dizziness. Denies visual changes. PCP recommended use of zostrix capasasian topical cream but has not trialed yet. He also reports long standing history of left leg weakness with sensory impairment and occasional left sided headaches. Hx of cervical issues previously seen by ortho in 2018 with complaints of left arm radiculopathy. This has since been stable. Denies history of stroke or prior brain imaging.      ROS:   14 system review of systems performed and negative with exception of those listed in HPI  PMH:  Past Medical History:  Diagnosis Date  . Abdominal pain 2009   CT abd pelvis /chronic  . Allergy    seasonal  . Hypertension   . RECTAL PAIN 12/08/2007   Chronic - had evaluation by Dr Sharlett Iles in 2009 including EGD, colonscopy, biopsy and abdomen CT.   Repeat abdomen CT in 06/2010 also negative       PSH:  Past Surgical History:  Procedure Laterality Date  . CIRCUMCISION     Foreskin still partially attached to glans  . COLONOSCOPY  2009   [place]  . HEMORRHOID SURGERY  2007    [place]  . UPPER GASTROINTESTINAL ENDOSCOPY  2009   Sharlett Iles    Social History:  Social History   Socioeconomic History  . Marital status: Married    Spouse name: Not on file  . Number of children: Not on file  . Years of education: Not on file  . Highest education level: Not on file  Occupational History  . Not on file  Tobacco Use  . Smoking status: Former Smoker    Packs/day: 2.00    Years: 28.00    Pack years: 56.00    Types: Cigarettes    Quit date: 2004    Years since quitting: 17.9  . Smokeless tobacco: Never Used  .  Tobacco comment: smoked for 28 yr- smoked up to 2-3ppd. quit smoking 80yr ago  Vaping Use  . Vaping Use: Never used  Substance and Sexual Activity  . Alcohol use: No  . Drug use: No  . Sexual activity: Yes    Partners: Female  Other Topics Concern  . Not on file  Social History Narrative   Born [place];   Brother is a physician in middle [place]   Has several children near by   Works as a Geophysicist/field seismologist   No other real hobbies    April 2018   Social Determinants of Health   Financial Resource Strain:   . Difficulty of Paying Living Expenses: Not on file  Food Insecurity:   . Worried About Charity fundraiser in the Last Year: Not on file  . Ran Out of Food in the Last Year: Not on file  Transportation Needs:   . Lack of Transportation (Medical): Not on file  . Lack of Transportation (Non-Medical): Not on file  Physical Activity:   . Days of Exercise per Week: Not on file  . Minutes of Exercise per Session: Not on file  Stress:   . Feeling of Stress : Not on file  Social Connections:   . Frequency of Communication with Friends and Family: Not on file  . Frequency of Social Gatherings with Friends and Family: Not on file  . Attends Religious Services: Not on file  . Active Member of Clubs or Organizations: Not on file  . Attends Archivist Meetings: Not on file  . Marital Status: Not on file  Intimate Partner Violence:   . Fear of Current or Ex-Partner: Not on file  . Emotionally Abused: Not on file  . Physically Abused: Not on file  . Sexually Abused: Not on file    Family History:  Family History  Problem Relation Age of Onset  . Diabetes Brother   . Sudden death Father   . Hypertension Mother   . Diabetes Mother   . Diabetes Sister   . Diabetes Cousin   . Colon cancer Sister   . Brain cancer Sister   . Pneumonia Brother     Medications:   Current Outpatient Medications on File Prior to Visit  Medication Sig Dispense Refill  . atorvastatin (LIPITOR)  10 MG tablet Take 1 tablet (10 mg total) by mouth daily. 90 tablet 1  . finasteride (PROPECIA) 1 MG tablet Take 1 mg by mouth daily.    Marland Kitchen losartan (COZAAR) 50 MG tablet Take 1 tablet (50 mg total) by mouth daily. 90 tablet 1   No current facility-administered medications on file prior to visit.    Allergies:  No Known Allergies    OBJECTIVE:  Physical Exam  Vitals:   02/28/20 0839  BP: 138/78  Pulse: 88  Weight:  186 lb (84.4 kg)  Height: 5\' 6"  (1.676 m)   Body mass index is 30.02 kg/m. No exam data present  General: well developed, well nourished, pleasant middle-age male, seated, in no evident distress Head: head normocephalic and atraumatic.   Neck: supple with no carotid or supraclavicular bruits Cardiovascular: regular rate and rhythm, no murmurs Musculoskeletal: no deformity Skin:  no rash/petichiae Vascular:  Normal pulses all extremities   Neurologic Exam Mental Status: Awake and fully alert. Oriented to place and time. Recent and remote memory intact. Attention span, concentration and fund of knowledge appropriate. Mood and affect appropriate.  Cranial Nerves: Pupils equal, briskly reactive to light. Extraocular movements full without nystagmus. Visual fields full to confrontation. Hearing intact. Normal Weber and rinne test. decreased sensory left lower face and outer ear compared to right side. Very slight left lower facial asymmetry when smiling. Face, tongue, palate moves normally and symmetrically.   Motor: Normal bulk and tone. Normal strength in all tested extremity muscles. Sensory.: Decreased light touch LLE weakness distally otherwise intact sensory Coordination: Rapid alternating movements normal in all extremities. Finger-to-nose performed accurately bilaterally and heel-to-shin showed mild left leg ataxia Gait and Station: Arises from chair without difficulty. Stance is normal. Gait demonstrates normal stride length and balance without use of assistive  device. Able to tandem walk and heel toe with mild difficulty. Romberg negative. Reflexes: 1+ and symmetric. Toes downgoing.       ASSESSMENT/PLAN: [name] is a 59 y.o. year old male here for follow-up regarding OSA on CPAP, BLE paresthesias and new complaints of left ear numbness although chronic symptom over the past 3-years.   OSA -Continue difficulty tolerating CPAP -Patient interested in further evaluation of hypoglossal nerve stimulator therefore referral placed to ENT Dr. Redmond Baseman or Dr. Wilburn Cornelia for further evaluation -Advised ongoing use of CPAP until further evaluation  BLE paresthesias -Initially evaluated by Dr. Rexene Alberts on 12/14/2019 with EMG/NCV negative and labs largely unremarkable except elevated A1c at 6.4 indicating prediabetes possibly contributing to symptoms -Prior long-term use of gabapentin recently discontinued by PCP due to lack of benefit  L ear numbness -Unknown etiology -Chronic symptom over the past 3 years without worsening. No prior stroke history -Denies vertigo, tinnitus, hearing loss, pain or visual changes although reported prior complaint of dizziness -Concern for possible chronic right-sided posterior circulation stroke with sensory changes on left side (facial and LLE), left lower facial weakness, LLE ataxia and history of dizziness complaints. Recommend obtaining MRI w/wo contrast to evaluate for possible prior infarct. Also reports new onset left-sided occipital headaches around the onset of above symptoms therefore imaging with contrast to rule out underlying lesions or abnormalities -Based on results, further work-up will be completed if indicated    Follow-up in 6 months or call earlier if needed   CC:  [facility] provider: Dr. Verlon Au, Jeb Levering, MD     I spent 40 minutes of face-to-face and non-face-to-face time with patient.  This included previsit chart review, lab review, study review, order entry, electronic health record  documentation, patient education and discussion regarding history of sleep apnea with CPAP intolerance and further evaluation of hypoglossal nerve stimulator, prior evaluation of lower extremity paresthesias, left hand numbness with associated exam findings and further evaluation and answered all other questions to patient satisfaction   Frann Rider, AGNP-BC  [facility] [address] [place], [place] [address]  Phone [phone] Fax [phone] Note: This document was prepared with digital dictation and possible smart phrase  technology. Any transcriptional errors that result from this process are unintentional.    I reviewed the above note and documentation by the Nurse Practitioner and agree with the history, exam, assessment and plan as outlined above. I was available for consultation. Star Age, MD, PhD [facility])

## 2020-02-28 NOTE — Patient Instructions (Addendum)
Obtain MRI w and wo contrast of your brain to assess for possible underling causing of your left sided symptoms - you will be called to schedule imaging  Referral placed to ENT for further evaluation of having an inspire device placed due to continued difficulty tolerating CPAP for adequate sleep apnea     Follow up for CPAP in 6 months  We will decide if sooner follow up is needed with myself or Dr .Rexene Alberts after completion of imaging      Thank you for coming to see [place] at [facility]. I hope we have been able to provide you high quality care today.  You may receive a patient satisfaction survey over the next few weeks. We would appreciate your feedback and comments so that we may continue to improve ourselves and the health of our patients.

## 2020-02-29 ENCOUNTER — Telehealth: Payer: Self-pay | Admitting: Adult Health

## 2020-02-29 NOTE — Telephone Encounter (Signed)
[facility] auth: [id number] (exp. 02/29/20 to 05/29/20) order sent to GI. They will reach out to the patient to schedule.

## 2020-02-29 NOTE — Telephone Encounter (Signed)
[facility] pending

## 2020-03-07 ENCOUNTER — Ambulatory Visit
Admission: RE | Admit: 2020-03-07 | Discharge: 2020-03-07 | Disposition: A | Discharge status: Home / Self Care | Payer: 59 | Source: Ambulatory Visit | Attending: Adult Health | Admitting: Adult Health

## 2020-03-07 DIAGNOSIS — R6889 Other general symptoms and signs: Secondary | ICD-10-CM

## 2020-03-07 DIAGNOSIS — R519 Headache, unspecified: Secondary | ICD-10-CM

## 2020-03-07 MED ORDER — GADOBENATE DIMEGLUMINE 529 MG/ML IV SOLN
17.0000 mL | Freq: Once | INTRAVENOUS | Status: AC | PRN
  Administered 2020-03-07: 17 mL via INTRAVENOUS

## 2020-03-09 ENCOUNTER — Telehealth: Payer: Self-pay

## 2020-03-09 NOTE — Telephone Encounter (Signed)
Spoke with the patient and got consent to speek with the daughter re: results. Permission granted by the patient. She said she would relay the message below

## 2020-03-09 NOTE — Telephone Encounter (Signed)
-----   Message from Frann Rider, NP sent at 03/09/2020 11:27 AM EST ----- Please advise patient that recent MRI was negative for underlying abnormality or stroke possibly contributing to symptoms. Please advise to follow up with PCP for further evaluation if indicated

## 2020-04-04 ENCOUNTER — Other Ambulatory Visit (HOSPITAL_COMMUNITY): Payer: Self-pay | Admitting: Urology

## 2020-04-04 MED FILL — TAMSULOSIN HCL 0.4 MG CAP: 0.4 | 30 days supply | Qty: 30 | Fill #0

## 2020-04-04 MED FILL — NYSTATIN 100,000 UNIT/GM CR: 100000 | 30 days supply | Qty: 45 | Fill #0

## 2020-04-20 ENCOUNTER — Other Ambulatory Visit (HOSPITAL_COMMUNITY): Payer: Self-pay | Admitting: Family Medicine

## 2020-04-20 MED FILL — FINASTERIDE 1 MG TABLET: 1 | 90 days supply | Qty: 90 | Fill #0

## 2020-04-20 MED FILL — LOSARTAN POTASSIUM 50 MG TA: 50 | 30 days supply | Qty: 30 | Fill #1

## 2020-05-08 MED FILL — TAMSULOSIN HCL 0.4 MG CAP: 0.4 | 30 days supply | Qty: 30 | Fill #1

## 2020-05-19 MED FILL — ATORVASTATIN CALCIUM 10 MG: 10 | 90 days supply | Qty: 90 | Fill #1

## 2020-05-19 MED FILL — LOSARTAN POTASSIUM 50 MG TA: 50 | 30 days supply | Qty: 30 | Fill #2

## 2020-06-09 MED FILL — TAMSULOSIN HCL 0.4 MG CAP: 0.4 | 30 days supply | Qty: 30 | Fill #2

## 2020-07-04 ENCOUNTER — Other Ambulatory Visit (HOSPITAL_COMMUNITY): Payer: Self-pay | Admitting: Urology

## 2020-07-10 ENCOUNTER — Other Ambulatory Visit (HOSPITAL_COMMUNITY): Payer: Self-pay

## 2020-07-10 ENCOUNTER — Other Ambulatory Visit: Payer: Self-pay | Admitting: Family Medicine

## 2020-07-10 MED FILL — Tamsulosin HCl Cap 0.4 MG: ORAL | 30 days supply | Qty: 30 | Fill #0 | Status: AC

## 2020-07-11 ENCOUNTER — Other Ambulatory Visit (HOSPITAL_COMMUNITY): Payer: Self-pay

## 2020-07-11 MED FILL — Clobetasol Propionate Cream 0.05%: CUTANEOUS | 30 days supply | Qty: 45 | Fill #0 | Status: AC

## 2020-07-13 ENCOUNTER — Other Ambulatory Visit (HOSPITAL_COMMUNITY): Payer: Self-pay

## 2020-07-25 ENCOUNTER — Other Ambulatory Visit: Payer: Self-pay | Admitting: Family Medicine

## 2020-07-26 ENCOUNTER — Other Ambulatory Visit (HOSPITAL_COMMUNITY): Payer: Self-pay

## 2020-07-26 MED ORDER — LOSARTAN POTASSIUM 50 MG PO TABS
50.0000 mg | ORAL_TABLET | Freq: Every day | ORAL | 2 refills | Status: DC
Start: 2020-07-26 — End: 2020-08-15
  Filled 2020-07-26: qty 90, 90d supply, fill #0

## 2020-08-11 ENCOUNTER — Other Ambulatory Visit (HOSPITAL_COMMUNITY): Payer: Self-pay

## 2020-08-11 MED ORDER — BETAMETHASONE DIPROPIONATE 0.05 % EX CREA
1.0000 "application " | TOPICAL_CREAM | Freq: Two times a day (BID) | CUTANEOUS | 0 refills | Status: DC
  Filled 2020-08-11: qty 45, 20d supply, fill #0

## 2020-08-14 ENCOUNTER — Other Ambulatory Visit: Payer: Self-pay | Admitting: Family Medicine

## 2020-08-14 MED FILL — Tamsulosin HCl Cap 0.4 MG: ORAL | 30 days supply | Qty: 30 | Fill #1 | Status: AC

## 2020-08-15 ENCOUNTER — Encounter: Payer: Self-pay | Admitting: Family Medicine

## 2020-08-15 ENCOUNTER — Ambulatory Visit (INDEPENDENT_AMBULATORY_CARE_PROVIDER_SITE_OTHER): Payer: 59 | Admitting: Family Medicine

## 2020-08-15 ENCOUNTER — Other Ambulatory Visit: Payer: Self-pay

## 2020-08-15 ENCOUNTER — Other Ambulatory Visit (HOSPITAL_COMMUNITY): Payer: Self-pay

## 2020-08-15 VITALS — BP 148/78 | HR 61 | Wt 173.4 lb

## 2020-08-15 DIAGNOSIS — E785 Hyperlipidemia, unspecified: Secondary | ICD-10-CM

## 2020-08-15 DIAGNOSIS — K5901 Slow transit constipation: Secondary | ICD-10-CM

## 2020-08-15 DIAGNOSIS — I10 Essential (primary) hypertension: Secondary | ICD-10-CM | POA: Diagnosis not present

## 2020-08-15 DIAGNOSIS — R7303 Prediabetes: Secondary | ICD-10-CM | POA: Diagnosis not present

## 2020-08-15 DIAGNOSIS — Z125 Encounter for screening for malignant neoplasm of prostate: Secondary | ICD-10-CM | POA: Diagnosis not present

## 2020-08-15 DIAGNOSIS — K5900 Constipation, unspecified: Secondary | ICD-10-CM | POA: Insufficient documentation

## 2020-08-15 LAB — POCT GLYCOSYLATED HEMOGLOBIN (HGB A1C): Hemoglobin A1C: 5.9 % — AB (ref 4.0–5.6)

## 2020-08-15 NOTE — Patient Instructions (Addendum)
Good to see you today - Thank you for coming in  Things we discussed today:  For the bowels I would double up on the fiber and the miralax twice a day until you are having regular bowel movements   Your goal blood pressure is less than 140/85.  Check your blood pressure several times a week.  If regularly higher than this please let me know - either with MyChart or leaving a phone message. Next visit please bring in your blood pressure cuff.     You should exercise at least 20 minutes every day.    Choose something you like the most or hate the least.   Having a set time every day and having a partner will help you stick to it.    If you are too tired try to do at least 5 minutes, it often gets easier.   I will call you if your tests are not good.  Otherwise, I will send you a message on MyChart (if it is active) or a letter in the mail..  If you do not hear from me with in 2 weeks please call our office.     Please always bring your medication bottles  Come back to see me in 6 months

## 2020-08-15 NOTE — Assessment & Plan Note (Signed)
Not at goal.  Monitor at home.  See after visit summary

## 2020-08-15 NOTE — Assessment & Plan Note (Signed)
No red flags and current on cancer screening.  Try increased fiber and miralax - titrate to stool frequency and texture

## 2020-08-15 NOTE — Progress Notes (Signed)
    SUBJECTIVE:   CHIEF COMPLAINT / HPI:   Ear Numbness About the same.  Had full work up at neurologist without any specific findings  Hypertension Taking losartan regularly.  Has home blood pressure cuff but does not checking regularly.  No chest pain or lightheadness or edema  Constipation Bowel movements are irregular and hard.  Taking miralax and fiber (does not know name) once a day.  Last colonoscopy in 2019 had another in December.  No bleeding  Lipids Daily lipitor.  Lost weight during Ramadan  PERTINENT  PMH / PSH: seeing urology but they do not check labs  OBJECTIVE:   BP (!) 148/78   Pulse 61   Wt 173 lb 6.4 oz (78.7 kg)   SpO2 100%   BMI 27.99 kg/m   Abdomen: soft and non-tender without masses, organomegaly or hernias noted.  No guarding or rebound Heart - Regular rate and rhythm.  No murmurs, gallops or rubs.      ASSESSMENT/PLAN:   Essential hypertension Not at goal.  Monitor at home.  See after visit summary   Dyslipidemia, goal LDL below 100 Check labs today.  Continue lipitor   Constipation No red flags and current on cancer screening.  Try increased fiber and miralax - titrate to stool frequency and texture   Hyperglycemia Normal A!c   Lind Covert, MD [place]

## 2020-08-15 NOTE — Assessment & Plan Note (Signed)
Check labs today.  Continue lipitor

## 2020-08-16 ENCOUNTER — Other Ambulatory Visit (HOSPITAL_COMMUNITY): Payer: Self-pay

## 2020-08-16 ENCOUNTER — Other Ambulatory Visit: Payer: Self-pay | Admitting: Family Medicine

## 2020-08-16 LAB — LIPID PANEL
Chol/HDL Ratio: 3.2 ratio (ref 0.0–5.0)
Cholesterol, Total: 108 mg/dL (ref 100–199)
HDL: 34 mg/dL — ABNORMAL LOW
LDL Chol Calc (NIH): 58 mg/dL (ref 0–99)
Triglycerides: 80 mg/dL (ref 0–149)
VLDL Cholesterol Cal: 16 mg/dL (ref 5–40)

## 2020-08-16 LAB — PSA: Prostate Specific Ag, Serum: 0.8 ng/mL (ref 0.0–4.0)

## 2020-08-17 ENCOUNTER — Other Ambulatory Visit (HOSPITAL_COMMUNITY): Payer: Self-pay

## 2020-08-18 ENCOUNTER — Other Ambulatory Visit (HOSPITAL_COMMUNITY): Payer: Self-pay

## 2020-08-18 MED ORDER — ATORVASTATIN CALCIUM 10 MG PO TABS
10.0000 mg | ORAL_TABLET | Freq: Every day | ORAL | 3 refills | Status: DC
  Filled 2020-08-18 – 2020-09-29 (×3): qty 90, 90d supply, fill #0
  Filled 2020-12-28: qty 90, 90d supply, fill #1
  Filled 2021-03-15: qty 90, 90d supply, fill #2
  Filled 2021-06-14: qty 90, 90d supply, fill #3

## 2020-08-28 ENCOUNTER — Other Ambulatory Visit (HOSPITAL_COMMUNITY): Payer: Self-pay

## 2020-08-29 ENCOUNTER — Ambulatory Visit (INDEPENDENT_AMBULATORY_CARE_PROVIDER_SITE_OTHER): Payer: 59 | Admitting: Adult Health

## 2020-08-29 ENCOUNTER — Encounter: Payer: Self-pay | Admitting: Adult Health

## 2020-08-29 ENCOUNTER — Telehealth: Payer: Self-pay | Admitting: Adult Health

## 2020-08-29 VITALS — BP 132/83 | HR 60 | Ht 66.0 in | Wt 170.0 lb

## 2020-08-29 DIAGNOSIS — R202 Paresthesia of skin: Secondary | ICD-10-CM | POA: Diagnosis not present

## 2020-08-29 DIAGNOSIS — Z789 Other specified health status: Secondary | ICD-10-CM | POA: Diagnosis not present

## 2020-08-29 DIAGNOSIS — R6889 Other general symptoms and signs: Secondary | ICD-10-CM | POA: Diagnosis not present

## 2020-08-29 DIAGNOSIS — G4733 Obstructive sleep apnea (adult) (pediatric): Secondary | ICD-10-CM

## 2020-08-29 NOTE — Telephone Encounter (Signed)
Patient seen today for CPAP compliance visit.  He continues to struggle tolerating CPAP machine. Wondering if he will be appropriate to pursue hypoglossal nerve stimulator.

## 2020-08-29 NOTE — Progress Notes (Addendum)
[facility] [address] [place]. [place] [address] [phone]       OFFICE FOLLOW UP NOTE  [name] Date of Birth:  07/09/1960 Medical Record Number:  [id number]   Reason for visit: CPAP visit [place] provider: Dr. Rexene Alberts   SUBJECTIVE:   CHIEF COMPLAINT:  Chief Complaint  Patient presents with   Obstructive Sleep Apnea    Rm14 alone Pt is well, having trouble with CPAP, he sleeps better without it.     HPI:   [name] is a 60 year old male with underlying medical history of HTN, fibromyalgia and overweight state who was evaluated by Dr. Rexene Alberts on 06/01/2019 for possible sleep apnea.  Underwent home sleep test on 06/28/2019 which showed moderate obstructive sleep apnea with total AHI of 16.5/h and O2 nadir of 84%.  Recommended initiating AutoPap for treatment.  Also evaluated on 12/14/2019 by Dr. Rexene Alberts for paresthesias of BLE -work-up largely unremarkable and of unknown etiology except prediabetes possibly contributing.  Symptoms have been stable since that time without worsening and do not interfere with daily functioning.  Prior visit complaints of left ear numbness which continues - due to other neurological deficits such as mild left facial weakness when smiling and LLE dysmetria, MRI obtained which was negative for any abnormalities.  He continues to experience left ear numbness but denies worsening  Today, 08/29/2020, [name] returns for 72-month scheduled follow-up.  In regards to CPAP, he continues to struggle with adequate usage with 43% compliance and residual AHI 0.8.  He reports continued difficulty tolerating mask despite trialing different types.  He questions possible use of other treatment methods.  Referral placed to ENT for evaluation of inspire device but he denies being called to schedule.         ROS:   14 system review of systems performed and negative with exception of those listed in HPI  PMH:  Past Medical History:   Diagnosis Date   Abdominal pain 2009   CT abd pelvis /chronic   Allergy    seasonal   Hypertension    RECTAL PAIN 12/08/2007   Chronic - had evaluation by Dr Sharlett Iles in 2009 including EGD, colonscopy, biopsy and abdomen CT.   Repeat abdomen CT in 06/2010 also negative       PSH:  Past Surgical History:  Procedure Laterality Date   CIRCUMCISION     Foreskin still partially attached to glans   COLONOSCOPY  2009   [facility]   HEMORRHOID SURGERY  2007    [facility]   UPPER GASTROINTESTINAL ENDOSCOPY  2009   [facility] Iles    Social History:  Social History   Socioeconomic History   Marital status: Married    Spouse name: Not on file   Number of children: Not on file   Years of education: Not on file   Highest education level: Not on file  Occupational History   Not on file  Tobacco Use   Smoking status: Former Smoker    Packs/day: 2.00    Years: 28.00    Pack years: 56.00    Types: Cigarettes    Quit date: 2004    Years since quitting: 18.4   Smokeless tobacco: Never Used   Tobacco comment: smoked for 28 yr- smoked up to 2-3ppd. quit smoking 16yr ago  Vaping Use   Vaping Use: Never used  Substance and Sexual Activity   Alcohol use: No   Drug use: No   Sexual activity: Yes    Partners:  Female  Other Topics Concern   Not on file  Social History Narrative   [place];   Brother is a Engineer, drilling in [place]   Has several children near by   Works as a Geophysicist/field seismologist   No other real hobbies    April 2018   Social Determinants of Radio broadcast assistant Strain: Not on file  Food Insecurity: Not on file  Transportation Needs: Not on file  Physical Activity: Not on file  Stress: Not on file  Social Connections: Not on file  Intimate Partner Violence: Not on file    Family History:  Family History  Problem Relation Age of Onset   Diabetes Brother    Sudden death Father    Hypertension Mother    Diabetes Mother    Diabetes Sister    Diabetes Cousin     Colon cancer Sister    Brain cancer Sister    Pneumonia Brother     Medications:   Current Outpatient Medications on File Prior to Visit  Medication Sig Dispense Refill   atorvastatin (LIPITOR) 10 MG tablet Take 1 tablet (10 mg total) by mouth daily. 90 tablet 3   betamethasone dipropionate 0.05 % cream Apply a bead size amount topically 2 (two) times daily. 45 g 0   finasteride (PROPECIA) 1 MG tablet Take 1 mg by mouth daily.     finasteride (PROPECIA) 1 MG tablet TAKE 1 TABLET (1 MG TOTAL) BY MOUTH DAILY. **SCHEDULE F/U APPT. FOR ADDITIONAL REFILLS.** 90 tablet 0   losartan (COZAAR) 50 MG tablet Take 1 tablet (50 mg total) by mouth daily. 90 tablet 1   tamsulosin (FLOMAX) 0.4 MG CAPS capsule TAKE 1 CAPSULE BY MOUTH EVERY DAY 30 capsule 11   No current facility-administered medications on file prior to visit.    Allergies:  No Known Allergies    OBJECTIVE:  Physical Exam  Vitals:   08/29/20 0832  BP: 132/83  Pulse: 60  Weight: 170 lb (77.1 kg)  Height: 5\' 6"  (1.676 m)   Body mass index is 27.44 kg/m. No exam data present  General: well developed, well nourished, pleasant middle-age male, seated, in no evident distress Head: head normocephalic and atraumatic.   Neck: supple with no carotid or supraclavicular bruits Cardiovascular: regular rate and rhythm, no murmurs Musculoskeletal: no deformity Skin:  no rash/petichiae Vascular:  Normal pulses all extremities   Neurologic Exam Mental Status: Awake and fully alert. Oriented to place and time. Recent and remote memory intact. Attention span, concentration and fund of knowledge appropriate. Mood and affect appropriate.  Cranial Nerves: Pupils equal, briskly reactive to light. Extraocular movements full without nystagmus. Visual fields full to confrontation. Hearing intact. Normal Weber and rinne test. decreased sensory left lower face and outer ear compared to right side. Very slight left lower facial asymmetry when  smiling. Face, tongue, palate moves normally and symmetrically.   Motor: Normal bulk and tone. Normal strength in all tested extremity muscles. Sensory.: Decreased light touch proximal BLE otherwise intact throughout Coordination: Rapid alternating movements normal in all extremities. Finger-to-nose performed accurately bilaterally and heel-to-shin showed mild left leg dysmetria Gait and Station: Arises from chair without difficulty. Stance is normal. Gait demonstrates normal stride length and balance without use of assistive device. Able to tandem walk and heel toe with mild difficulty. Romberg negative. Reflexes: 1+ and symmetric. Toes downgoing.       ASSESSMENT/PLAN: [name] is a 60 y.o. year old male here for follow-up  regarding OSA on CPAP, BLE paresthesias and new complaints of left ear numbness although chronic symptom over the past 3-years.   OSA -Continued difficulty tolerating CPAP with a low compliance -will send message to Dr. Rexene Alberts to see if he would be appropriate to pursue hypoglossal nerve stimulator -Advised ongoing use of CPAP until further evaluation  BLE paresthesias -Initially evaluated by Dr. Rexene Alberts on 12/14/2019 with EMG/NCV negative and labs largely unremarkable except elevated A1c at 6.4 indicating prediabetes possibly contributing to symptoms -Symptoms stable without progressing and do not interfere with daily activity or functioning  L ear numbness -Unknown etiology -Chronic symptom over the past 3 years without worsening. No prior stroke history with MR brain 03/2020 unremarkable -Denies vertigo, tinnitus, hearing loss, pain or visual changes -Recommend follow-up with PCP to discuss possible evaluation by ENT    Follow-up in 1 year or call earlier if needed   CC:  [place] provider: Dr. Verlon Au, Jeb Levering, MD      Frann Rider, AGNP-BC  [facility] [address] [place], [place] [address]  Phone  [phone] Fax [phone] Note: This document was prepared with digital dictation and possible smart phrase technology. Any transcriptional errors that result from this process are unintentional.   I reviewed the above note and documentation by the Nurse Practitioner and agree with the history, exam, assessment and plan as outlined above. I was available for consultation. We will arrange for a brief office visit to discuss inspire candidacy and referral. Star Age, MD, PhD [facility] ([place])

## 2020-08-29 NOTE — Patient Instructions (Addendum)
Your Plan:  Continue use of CPAP but try to increase usage and length of time used as untreated sleep apnea can increase your risk of stroke and heart disease  Would recommend further evaluation for use of inspire device (hypoglossal nerve stimulator) which can be used as an alternative treatment for sleep apnea - if interested in additional information, please let me know and I will send a referral to ENT  Unknown cause of your continued left ear numbness -I would further discuss with your PCP and possibly consider evaluation with ENT to rule out any underlying causes    Follow-up in 1 year if you continue to use your CPAP or call earlier if needed     Thank you for coming to see [place] at [facility]. I hope we have been able to provide you high quality care today.  You may receive a patient satisfaction survey over the next few weeks. We would appreciate your feedback and comments so that we may continue to improve ourselves and the health of our patients.    Sleep Apnea Sleep apnea is a condition in which breathing pauses or becomes shallow during sleep. Episodes of sleep apnea usually last 10 seconds or longer, and they may occur as many as 20 times an hour. Sleep apnea disrupts your sleep and keeps your body from getting the rest that it needs. This condition can increase your risk of certain health problems, including:  Heart attack.  Stroke.  Obesity.  Diabetes.  Heart failure.  Irregular heartbeat. What are the causes? There are three kinds of sleep apnea:  Obstructive sleep apnea. This kind is caused by a blocked or collapsed airway.  Central sleep apnea. This kind happens when the part of the brain that controls breathing does not send the correct signals to the muscles that control breathing.  Mixed sleep apnea. This is a combination of obstructive and central sleep apnea. The most common cause of this condition is a collapsed or blocked airway.  An airway can collapse or become blocked if:  Your throat muscles are abnormally relaxed.  Your tongue and tonsils are larger than normal.  You are overweight.  Your airway is smaller than normal.   What increases the risk? You are more likely to develop this condition if you:  Are overweight.  Smoke.  Have a smaller than normal airway.  Are elderly.  Are male.  Drink alcohol.  Take sedatives or tranquilizers.  Have a family history of sleep apnea. What are the signs or symptoms? Symptoms of this condition include:  Trouble staying asleep.  Daytime sleepiness and tiredness.  Irritability.  Loud snoring.  Morning headaches.  Trouble concentrating.  Forgetfulness.  Decreased interest in sex.  Unexplained sleepiness.  Mood swings.  Personality changes.  Feelings of depression.  Waking up often during the night to urinate.  Dry mouth.  Sore throat. How is this diagnosed? This condition may be diagnosed with:  A medical history.  A physical exam.  A series of tests that are done while you are sleeping (sleep study). These tests are usually done in a sleep lab, but they may also be done at home. How is this treated? Treatment for this condition aims to restore normal breathing and to ease symptoms during sleep. It may involve managing health issues that can affect breathing, such as high blood pressure or obesity. Treatment may include:  Sleeping on your side.  Using a decongestant if you have nasal congestion.  Avoiding the  use of depressants, including alcohol, sedatives, and narcotics.  Losing weight if you are overweight.  Making changes to your diet.  Quitting smoking.  Using a device to open your airway while you sleep, such as: ? An oral appliance. This is a custom-made mouthpiece that shifts your lower jaw forward. ? A continuous positive airway pressure (CPAP) device. This device blows air through a mask when you breathe out  (exhale). ? A nasal expiratory positive airway pressure (EPAP) device. This device has valves that you put into each nostril. ? A bi-level positive airway pressure (BPAP) device. This device blows air through a mask when you breathe in (inhale) and breathe out (exhale).  Having surgery if other treatments do not work. During surgery, excess tissue is removed to create a wider airway. It is important to get treatment for sleep apnea. Without treatment, this condition can lead to:  High blood pressure.  Coronary artery disease.  In men, an inability to achieve or maintain an erection (impotence).  Reduced thinking abilities.   Follow these instructions at home: Lifestyle  Make any lifestyle changes that your health care provider recommends.  Eat a healthy, well-balanced diet.  Take steps to lose weight if you are overweight.  Avoid using depressants, including alcohol, sedatives, and narcotics.  Do not use any products that contain nicotine or tobacco, such as cigarettes, e-cigarettes, and chewing tobacco. If you need help quitting, ask your health care provider. General instructions  Take over-the-counter and prescription medicines only as told by your health care provider.  If you were given a device to open your airway while you sleep, use it only as told by your health care provider.  If you are having surgery, make sure to tell your health care provider you have sleep apnea. You may need to bring your device with you.  Keep all follow-up visits as told by your health care provider. This is important. Contact a health care provider if:  The device that you received to open your airway during sleep is uncomfortable or does not seem to be working.  Your symptoms do not improve.  Your symptoms get worse. Get help right away if:  You develop: ? Chest pain. ? Shortness of breath. ? Discomfort in your back, arms, or stomach.  You have: ? Trouble speaking. ? Weakness on  one side of your body. ? Drooping in your face. These symptoms may represent a serious problem that is an emergency. Do not wait to see if the symptoms will go away. Get medical help right away. Call your local emergency services (911 in the [place] Do not drive yourself to the hospital. Summary  Sleep apnea is a condition in which breathing pauses or becomes shallow during sleep.  The most common cause is a collapsed or blocked airway.  The goal of treatment is to restore normal breathing and to ease symptoms during sleep. This information is not intended to replace advice given to you by your health care provider. Make sure you discuss any questions you have with your health care provider. Document Revised: 09/09/2018 Document Reviewed: 11/18/2017 Elsevier Patient Education  2021 [employer].

## 2020-08-29 NOTE — Addendum Note (Signed)
Addended by: Mal Misty on: 08/29/2020 04:20 PM   Modules accepted: Orders

## 2020-09-14 NOTE — Telephone Encounter (Signed)
Please call patient and schedule a brief follow-up visit so we can discuss treatment with inspire as opposed to continuing with his CPAP.

## 2020-09-19 ENCOUNTER — Other Ambulatory Visit (HOSPITAL_COMMUNITY): Payer: Self-pay

## 2020-09-19 MED FILL — Tamsulosin HCl Cap 0.4 MG: ORAL | 30 days supply | Qty: 30 | Fill #2 | Status: CN

## 2020-09-27 ENCOUNTER — Other Ambulatory Visit (HOSPITAL_COMMUNITY): Payer: Self-pay

## 2020-09-29 ENCOUNTER — Other Ambulatory Visit (HOSPITAL_COMMUNITY): Payer: Self-pay

## 2020-09-29 MED FILL — Tamsulosin HCl Cap 0.4 MG: ORAL | 30 days supply | Qty: 30 | Fill #2 | Status: AC

## 2020-10-09 ENCOUNTER — Encounter: Payer: Self-pay | Admitting: Neurology

## 2020-10-11 ENCOUNTER — Encounter: Payer: Self-pay | Admitting: Neurology

## 2020-10-11 ENCOUNTER — Ambulatory Visit (INDEPENDENT_AMBULATORY_CARE_PROVIDER_SITE_OTHER): Payer: 59 | Admitting: Neurology

## 2020-10-11 ENCOUNTER — Other Ambulatory Visit: Payer: Self-pay

## 2020-10-11 VITALS — BP 118/74 | HR 70 | Ht 66.0 in | Wt 169.0 lb

## 2020-10-11 DIAGNOSIS — R202 Paresthesia of skin: Secondary | ICD-10-CM

## 2020-10-11 DIAGNOSIS — R634 Abnormal weight loss: Secondary | ICD-10-CM | POA: Diagnosis not present

## 2020-10-11 DIAGNOSIS — Z789 Other specified health status: Secondary | ICD-10-CM | POA: Diagnosis not present

## 2020-10-11 DIAGNOSIS — G4733 Obstructive sleep apnea (adult) (pediatric): Secondary | ICD-10-CM | POA: Diagnosis not present

## 2020-10-11 NOTE — Progress Notes (Signed)
Subjective:    Patient ID: [name] is a 60 y.o. male.  HPI    Interim history:   [name] is a 60 year old right-handed gentleman with an underlying medical history of hypertension, sleep apnea, allergies, and overweight state, who presents for follow-up consultation of his obstructive sleep apnea, to discuss the possibility of pursuing Inspire, hypoglossal nerve stimulator.  The patient is unaccompanied today.  I last saw him on 12/14/2019 at the request of his primary care physician for evaluation of his paresthesias.  He reported a longstanding history of discomfort and some numbness in both feet.  He had been on gabapentin for years.  We did blood work at the time, A1c was on the upper limit of prediabetes range.  He had an EMG and nerve conduction velocity test on 01/13/2020 which indicated benign findings.  He saw Frann Rider, NP in the interim on 02/28/2020 at which time he was not fully compliant with his AutoPap machine.  He saw Frann Rider, NP in follow-up on 08/29/2020 at which time he was struggling with his AutoPap machine.  Today, 10/11/2020: I reviewed his AutoPap compliance data for the past 90 days from 07/12/2020 through 10/09/2020, during which time he used his machine 35 out of 90 days with percent use days greater than 4 hours at 29%, indicating suboptimal compliance.  He is actually has had no usage since the last week of May 2022.  He reports ongoing difficulty tolerating AutoPap because of drainage and allergy symptoms, he has to wake up multiple times a night to take the mask off and either wipe it or blow his nose.  He is working on weight loss, in fact, he has lost over 10 to 12 pounds.  His current weight is 169 pounds and at the time of his home sleep test in March 2021 he weighed 181 pounds.  He would be interested in [place], or dental device.  He has spoken with a Psychiatric nurse as I understand for the possibility of surgery to remove some of the excess tissue  under his chin.  He had a recheck with his primary care physician and A1c had improved to 5.9 in May 2022.  He is no longer on gabapentin.  As he tapered off of it his discomfort in the feet did increase but he decided to continue to stay off of it.  He had been on the gabapentin for many years.   Previously:  He was diagnosed with moderate obstructive sleep apnea with a home sleep test in March 2021.  He had a total AHI of 16.5, O2 nadir of 84%.  He saw a nurse practitioner, Frann Rider on 08/26/2019, at which time he was compliant with his AutoPap he was still struggling with tolerance.     06/01/19: 60 year old right-handed gentleman with an underlying medical history of hypertension, leg pain, and overweight state, who reports snoring and witnessed apneas per wife's report.  I reviewed her office note from 05/20/2019.  His Epworth sleepiness score is 3 out of 24.  He occasionally wakes up with a headache in the back of his head.  Sometimes he takes ibuprofen, he does have significant nocturia, 2-3 times per average night.  He has some difficulty falling asleep and staying asleep.  Bedtime is currently past midnight, rise time around 8.  He works as a Geophysicist/field seismologist, Clinical research associate.  He is a non-smoker and does not drink alcohol, drinks caffeine in the form of tea, about 3 cups/day, no pets  in the home.  Lives with his wife and youngest child who is in middle school, he has 2 in college.  He has a TV in the bedroom but turns it off when he is ready to fall asleep.  His Past Medical History Is Significant For: Past Medical History:  Diagnosis Date   Abdominal pain 2009   CT abd pelvis /chronic   Allergy    seasonal   Hypertension    RECTAL PAIN 12/08/2007   Chronic - had evaluation by Dr Sharlett Iles in 2009 including EGD, colonscopy, biopsy and abdomen CT.   Repeat abdomen CT in 06/2010 also negative       His Past Surgical History Is Significant For: Past Surgical History:  Procedure Laterality Date    CIRCUMCISION     Foreskin still partially attached to glans   COLONOSCOPY  2009   [place]  2007    [place]   UPPER GASTROINTESTINAL ENDOSCOPY  2009   [place]    His Family History Is Significant For: Family History  Problem Relation Age of Onset   Diabetes Brother    Sudden death Father    Hypertension Mother    Diabetes Mother    Diabetes Sister    Diabetes Cousin    Colon cancer Sister    Brain cancer Sister    Pneumonia Brother     His Social History Is Significant For: Social History   Socioeconomic History   Marital status: Married    Spouse name: Not on file   Number of children: Not on file   Years of education: Not on file   Highest education level: Not on file  Occupational History   Not on file  Tobacco Use   Smoking status: Former    Packs/day: 2.00    Years: 28.00    Pack years: 56.00    Types: Cigarettes    Quit date: 2004    Years since quitting: 18.5   Smokeless tobacco: Never   Tobacco comments:    smoked for 28 yr- smoked up to 2-3ppd. quit smoking 34yr ago  Vaping Use   Vaping Use: Never used  Substance and Sexual Activity   Alcohol use: No   Drug use: No   Sexual activity: Yes    Partners: Female  Other Topics Concern   Not on file  Social History Narrative   [place];   Brother is a physician in middle [place]   Has several children near by   Works as a Geophysicist/field seismologist   No other real hobbies    April 2018      Caffeine: tea, minimum 4 cups/day   Social Determinants of Health   Financial Resource Strain: Not on file  Food Insecurity: Not on file  Transportation Needs: Not on file  Physical Activity: Not on file  Stress: Not on file  Social Connections: Not on file    His Allergies Are:  No Known Allergies:   His Current Medications Are:  Outpatient Encounter Medications as of 10/11/2020  Medication Sig   atorvastatin (LIPITOR) 10 MG tablet Take 1 tablet (10 mg total) by mouth daily.    betamethasone dipropionate 0.05 % cream Apply a bead size amount topically 2 (two) times daily.   finasteride (PROPECIA) 1 MG tablet Take 1 mg by mouth daily.   finasteride (PROPECIA) 1 MG tablet TAKE 1 TABLET (1 MG TOTAL) BY MOUTH DAILY. **SCHEDULE F/U APPT. FOR ADDITIONAL REFILLS.**   losartan (COZAAR) 50 MG  tablet Take 1 tablet (50 mg total) by mouth daily.   tamsulosin (FLOMAX) 0.4 MG CAPS capsule TAKE 1 CAPSULE BY MOUTH EVERY DAY   No facility-administered encounter medications on file as of 10/11/2020.  :  Review of Systems:  Out of a complete 14 point review of systems, all are reviewed and negative with the exception of these symptoms as listed below:  Review of Systems  Neurological:        Patient is here today to discuss potential treatment with Inspire. He uses CPAP but has problems with allergy symptoms. He cannot sleep all night with it.    Objective:  Neurological Exam  Physical Exam Physical Examination:   Vitals:   10/11/20 0730  BP: 118/74  Pulse: 70    General Examination: The patient is a very pleasant 60 y.o. male in no acute distress. He appears well-developed and well-nourished and well groomed.   HEENT: Normocephalic, atraumatic, pupils are equal, round and reactive to light, extraocular tracking is good without limitation to gaze excursion or nystagmus noted.  Corrective eyeglasses in place.  Hearing is grossly intact. Face is symmetric with normal facial animation. Speech is clear with no dysarthria noted. There is no hypophonia. There is no lip, neck/head, jaw or voice tremor. Neck is supple with full range of passive and active motion. There are no carotid bruits on auscultation. Oropharynx exam reveals: mild mouth dryness, adequate dental hygiene and moderate airway crowding, secondary to larger uvula, tonsils in place.  Tongue protrudes centrally in palate elevates symmetrically.  Minimal overbite.   Chest: Clear to auscultation without wheezing, rhonchi or  crackles noted.   Heart: S1+S2+0, regular and normal without murmurs, rubs or gallops noted.   Abdomen: Soft, non-tender and non-distended.   Extremities: There is no pitting edema in the distal lower extremities bilaterally.   Skin: Warm and dry without trophic changes noted.  Signs of vitiligo especially distal lower extremities bilaterally.   Musculoskeletal: exam reveals no obvious joint deformities.   Neurologically: Mental status: The patient is awake, alert and oriented in all 4 spheres. His immediate and remote memory, attention, language skills and fund of knowledge are appropriate. There is no evidence of aphasia, agnosia, apraxia or anomia. Speech is clear with normal prosody and enunciation. Thought process is linear. Mood is normal and affect is normal. Cranial nerves II - XII are as described above under HEENT exam. Motor exam: Normal bulk, strength and tone is noted. There is no tremor, fine motor skills and coordination: grossly intact. Cerebellar testing: No dysmetria or intention tremor. There is no truncal or gait ataxia.  Sensory exam: Intact to light touch and temperature sense.  Gait, station and balance: He stands easily. No veering to one side is noted. No leaning to one side is noted. Posture is age-appropriate and stance is narrow based. Gait shows normal stride length and normal pace. No problems turning are noted.    Assessment and Plan:    In summary, [name] is a very pleasant 60 year old male with an underlying medical history of hypertension, sleep apnea, allergies, and overweight state, who presents for follow-up consultation of his obstructive sleep apnea with intolerance to AutoPap therapy.  He has struggled for quite some time with it and really did try to be compliant.  He is primarily bothered by drainage and discomfort and the need for wiping his mask throughout the night or blowing his nose.  He is interested in exploring alternative treatment  options, we  talked about the dental device option versus an implantable surgical device called inspire.  We had a extended discussion today, he has also worked on weight loss.  Paresthesias are stable, he did notice some worsening as he tapered off the gabapentin.  Most recent A1c was improved, still in the prediabetes range.  He is commended on his weight loss.  We mutually agreed to reevaluate his sleep apnea with a home sleep test as he lost about 10 to 12 pounds since we first tested him.  We will consider a referral to ENT versus dentistry next, depending on the test results.  He is agreeable to this approach.  I answered all his questions today and he was in agreement.  He is advised to continue to monitor his paresthesias and we can follow him for this if need be as well.  We will call him to schedule his home sleep test and also keep him posted as to his results by phone call for now and take it from there.  I spent 30 minutes in total face-to-face time and in reviewing records during pre-charting, more than 50% of which was spent in counseling and coordination of care, reviewing test results, reviewing medications and treatment regimen and/or in discussing or reviewing the diagnosis of OSA, paresthesias, the prognosis and treatment options. Pertinent laboratory and imaging test results that were available during this visit with the patient were reviewed by me and considered in my medical decision making (see chart for details).

## 2020-10-11 NOTE — Patient Instructions (Addendum)
It was nice to see you again today.  You have done a great job losing weight. I am sorry to hear that you have continued to have trouble tolerating AutoPap therapy.   Since your previous home sleep test showed moderate sleep apnea, I would like to reevaluate you with a home sleep test as you have lost about 10 to 12 pounds.  This may have made enough of a difference to reduce the severity of your sleep apnea.  We can consider a referral to dentistry for evaluation for an oral appliance or we can certainly look into a referral to ENT to discuss the inspire.  As explained, this is a implanted tongue nerve stimulator.  Let's wait out your new home sleep test results first and see what route we should take next.  We will call you with your home sleep test results. We can continue to monitor your numbness and tingling in your feet.

## 2020-11-07 ENCOUNTER — Other Ambulatory Visit: Payer: Self-pay | Admitting: Family Medicine

## 2020-11-07 ENCOUNTER — Other Ambulatory Visit (HOSPITAL_COMMUNITY): Payer: Self-pay

## 2020-11-07 MED FILL — Tamsulosin HCl Cap 0.4 MG: ORAL | 30 days supply | Qty: 30 | Fill #3 | Status: AC

## 2020-11-08 ENCOUNTER — Other Ambulatory Visit (HOSPITAL_COMMUNITY): Payer: Self-pay

## 2020-11-08 MED ORDER — LOSARTAN POTASSIUM 50 MG PO TABS
50.0000 mg | ORAL_TABLET | Freq: Every day | ORAL | 2 refills | Status: DC
  Filled 2020-11-08: qty 30, 30d supply, fill #0
  Filled 2020-12-07: qty 30, 30d supply, fill #1
  Filled 2021-01-11: qty 30, 30d supply, fill #2
  Filled 2021-02-12: qty 30, 30d supply, fill #3
  Filled 2021-03-15: qty 30, 30d supply, fill #4
  Filled 2021-04-13: qty 30, 30d supply, fill #5
  Filled 2021-05-15: qty 30, 30d supply, fill #6
  Filled 2021-06-14: qty 30, 30d supply, fill #7
  Filled 2021-07-12: qty 30, 30d supply, fill #8

## 2020-11-13 ENCOUNTER — Ambulatory Visit (INDEPENDENT_AMBULATORY_CARE_PROVIDER_SITE_OTHER): Payer: 59 | Admitting: Neurology

## 2020-11-13 DIAGNOSIS — R634 Abnormal weight loss: Secondary | ICD-10-CM

## 2020-11-13 DIAGNOSIS — G4733 Obstructive sleep apnea (adult) (pediatric): Secondary | ICD-10-CM | POA: Diagnosis not present

## 2020-11-13 DIAGNOSIS — Z789 Other specified health status: Secondary | ICD-10-CM

## 2020-11-15 NOTE — Progress Notes (Signed)
See procedure note.

## 2020-11-16 NOTE — Procedures (Signed)
     [facility]  HOME SLEEP TEST (Watch PAT) REPORT  STUDY DATE: 11/13/2020  DOB: 02-Nov-1960  MRN: [id number]  ORDERING CLINICIAN: Star Age, MD, PhD   REFERRING CLINICIAN: Frann Rider, NP  CLINICAL INFORMATION/HISTORY: 60 year old right-handed gentleman with an underlying medical history of hypertension, sleep apnea, allergies, and overweight state, who presents for evaluation of his obstructive sleep apnea.  He has been on AutoPap therapy but has had difficulty tolerating it secondary to nasal drainage and allergy symptoms.  He has worked on weight loss.  Epworth sleepiness score: 3/24.  BMI: 29.1 kg/m  FINDINGS:   Sleep Summary:   Total Recording Time (hours, min): 8 hours, 17 minutes  Total Sleep Time (hours, min):  6 hours, 41 minutes   Percent REM (%):    17.5%   Respiratory Indices:   Calculated pAHI (per hour):  12/hour         REM pAHI:    8.6/hour       NREM pAHI: 12.7/hour  Oxygen Saturation Statistics:    Oxygen Saturation (%) Mean: 93%   Minimum oxygen saturation (%):                 83%   O2 Saturation Range (%): 83-98%    O2 Saturation (minutes) <=88%: 0.5 min  Pulse Rate Statistics:   Pulse Mean (bpm):    60/min    Pulse Range (48-102/min)   IMPRESSION: OSA (obstructive sleep apnea), mild  RECOMMENDATION:  This home sleep test demonstrates overall mild obstructive sleep apnea with a total AHI of 12/hour and O2 nadir of 83%.  The patient previously was diagnosed with moderate obstructive sleep apnea based on a home sleep test in March 2021.  He has been working on weight loss and further weight loss may help reduce his obstructive sleep disordered breathing.  Inspire, a hypoglossal nerve stimulator is typically reserved for patients with moderate to severe obstructive sleep apnea and positive airway pressure treatment intolerance.  The patient will be advised of his test results.  A dental device could be considered and  restarting AutoPap may also be an option for him.  These avenues will be discussed with the patient.  A follow-up in sleep clinic will be made as necessary.   The patient should be cautioned not to drive, work at heights, or operate dangerous or heavy equipment when tired or sleepy. Review and reiteration of good sleep hygiene measures should be pursued with any patient. Other causes of the patient's symptoms, including circadian rhythm disturbances, an underlying mood disorder, medication effect and/or an underlying medical problem cannot be ruled out based on this test. Clinical correlation is recommended.   The patient and his referring provider will be notified of the test results. The patient will be seen in follow up in sleep clinic at [facility].  I certify that I have reviewed the raw data recording prior to the issuance of this report in accordance with the standards of the American Academy of Sleep Medicine (AASM).  INTERPRETING PHYSICIAN:   Star Age, MD, PhD  Board Certified in Neurology and Sleep Medicine  [facility] [address] [place], [place] [address] [phone]

## 2020-11-20 ENCOUNTER — Telehealth: Payer: Self-pay | Admitting: *Deleted

## 2020-11-20 DIAGNOSIS — G4733 Obstructive sleep apnea (adult) (pediatric): Secondary | ICD-10-CM

## 2020-11-20 NOTE — Telephone Encounter (Signed)
Spoke with patient and discussed sleep study results from recent home sleep test.  Patient aware that on 2021 sleep test moderate sleep apnea was diagnosed however now he is showing a residual sleep apnea in the mild range on his recent home sleep test.  Discussed that he has lost some weight. Also discussed other options for treatment.  At this time patient would like to continue to lose weight as he states he is losing weight "all the time" and also want to be referred to dentistry for consultation for oral device.  His questions were answered and he verbalized appreciation for the call.  The

## 2020-11-20 NOTE — Addendum Note (Signed)
Addended by: Star Age on: 11/20/2020 04:26 PM   Modules accepted: Orders

## 2020-11-20 NOTE — Telephone Encounter (Signed)
Referral to dentistry placed. 

## 2020-11-20 NOTE — Telephone Encounter (Signed)
Great, thanks

## 2020-11-20 NOTE — Telephone Encounter (Signed)
-----   Message from Star Age, MD sent at 11/16/2020  4:28 PM EDT ----- Patient recently had a home sleep test on 11/13/2020 for reevaluation of his OSA.  He has been on AutoPap therapy and was previously diagnosed with moderate sleep apnea based on a home sleep test in March 2021.  Please advise patient that his current home sleep test indicates mild sleep apnea.  He has been able to lose some weight.  We had talked about the possibility of referring him for an inspire implantation but I also discussed with him that inspire is typically reserved for CPAP intolerance in the context of moderate to severe obstructive sleep apnea.  Currently, as it stands, his residual sleep apnea is mild.   Further weight loss is a treatment option for him.  We can consider a dental device with the help of a dentist.  If he would like to explore this option, we can make a referral to dentistry.  He can also restart using his AutoPap if he would prefer, we can try to reduce the pressure setting.  Please let me know how he would like to proceed.  I would be happy to make a referral to dentistry for consultation for an oral appliance.

## 2020-11-21 ENCOUNTER — Telehealth: Payer: Self-pay | Admitting: Neurology

## 2020-11-21 NOTE — Telephone Encounter (Signed)
Faxed referral to Dr. Toy Cookey ph # [phone] & fax # [phone].

## 2020-12-07 ENCOUNTER — Other Ambulatory Visit (HOSPITAL_COMMUNITY): Payer: Self-pay

## 2020-12-07 MED FILL — Tamsulosin HCl Cap 0.4 MG: ORAL | 30 days supply | Qty: 30 | Fill #4 | Status: AC

## 2020-12-28 ENCOUNTER — Other Ambulatory Visit (HOSPITAL_COMMUNITY): Payer: Self-pay

## 2021-01-11 ENCOUNTER — Encounter: Payer: Self-pay | Admitting: Family Medicine

## 2021-01-11 ENCOUNTER — Other Ambulatory Visit (HOSPITAL_COMMUNITY): Payer: Self-pay

## 2021-01-11 ENCOUNTER — Other Ambulatory Visit: Payer: Self-pay

## 2021-01-11 ENCOUNTER — Ambulatory Visit (INDEPENDENT_AMBULATORY_CARE_PROVIDER_SITE_OTHER): Payer: 59 | Admitting: Family Medicine

## 2021-01-11 VITALS — BP 133/87 | HR 59 | Wt 165.6 lb

## 2021-01-11 DIAGNOSIS — I10 Essential (primary) hypertension: Secondary | ICD-10-CM | POA: Diagnosis not present

## 2021-01-11 DIAGNOSIS — R634 Abnormal weight loss: Secondary | ICD-10-CM

## 2021-01-11 DIAGNOSIS — R4586 Emotional lability: Secondary | ICD-10-CM

## 2021-01-11 DIAGNOSIS — Z125 Encounter for screening for malignant neoplasm of prostate: Secondary | ICD-10-CM | POA: Diagnosis not present

## 2021-01-11 DIAGNOSIS — Z23 Encounter for immunization: Secondary | ICD-10-CM | POA: Diagnosis not present

## 2021-01-11 MED ORDER — FINASTERIDE 1 MG PO TABS
1.0000 mg | ORAL_TABLET | Freq: Every day | ORAL | 0 refills | Status: DC
  Filled 2021-01-11 – 2021-01-24 (×2): qty 90, 90d supply, fill #0

## 2021-01-11 MED FILL — Tamsulosin HCl Cap 0.4 MG: ORAL | 30 days supply | Qty: 30 | Fill #5 | Status: AC

## 2021-01-11 NOTE — Assessment & Plan Note (Signed)
PHQ 9 of 17.   He does not feel he has depression and is not currently interested in counseling or medication.  Discussed possibilities.  Will try exercise and better eating and monitor closely

## 2021-01-11 NOTE — Assessment & Plan Note (Signed)
Sounds most likely due to decreased intake.  Will check labs for endocrine and organ illnesses.  See mood discussion.  Follow up in a few months

## 2021-01-11 NOTE — Progress Notes (Signed)
    SUBJECTIVE:   CHIEF COMPLAINT / HPI:   Weight  Has been losing weight since Ramadan.  No nausea and vomiting or change in bowel movement.  No fevers or chills.   Feels he is likely eating less - just not hungry  Mood  He does feel down sometimes more of a time of life issue than he feels than overall depression.  Does not sleep well for many years.  Is not exercising.   No suicidal ideation    PERTINENT  PMH / PSH: has tried treatment for sleep apnea in the past  OBJECTIVE:   BP 133/87   Pulse (!) 59   Wt 165 lb 9.6 oz (75.1 kg)   SpO2 100%   BMI 26.73 kg/m   Alert nad Psych:  Cognition and judgment appear intact. Alert, communicative  and cooperative with normal attention span and concentration. No apparent delusions, illusions, hallucinations Heart - Regular rate and rhythm.  No murmurs, gallops or rubs.    Lungs:  Normal respiratory effort, chest expands symmetrically. Lungs are clear to auscultation, no crackles or wheezes. Abdomen: soft and non-tender without masses, organomegaly or hernias noted.  No guarding or rebound Extremities:  No cyanosis, edema, or deformity noted with good range of motion of all major joints.     ASSESSMENT/PLAN:   Weight loss Sounds most likely due to decreased intake.  Will check labs for endocrine and organ illnesses.  See mood discussion.  Follow up in a few months   Mood change PHQ 9 of 17.   He does not feel he has depression and is not currently interested in counseling or medication.  Discussed possibilities.  Will try exercise and better eating and monitor closely     Lind Covert, [place]

## 2021-01-11 NOTE — Patient Instructions (Signed)
Good to see you today - Thank you for coming in  Things we discussed today:  I will call you if your tests are not good.  Otherwise, I will send you a message on MyChart (if it is active) or a letter in the mail..  If you do not hear from me with in 2 weeks please call our office.     Exercise 15 -30 minutes at least every other day   If things are not getting better then My Chart   Please always bring your medication bottles  Come back to see me in 3 months

## 2021-01-12 LAB — CMP14+EGFR
ALT: 15 IU/L (ref 0–44)
AST: 17 IU/L (ref 0–40)
Albumin/Globulin Ratio: 2.1 (ref 1.2–2.2)
Albumin: 4.4 g/dL (ref 3.8–4.9)
Alkaline Phosphatase: 53 IU/L (ref 44–121)
BUN/Creatinine Ratio: 9 — ABNORMAL LOW (ref 10–24)
BUN: 9 mg/dL (ref 8–27)
Bilirubin Total: 0.3 mg/dL (ref 0.0–1.2)
CO2: 25 mmol/L (ref 20–29)
Calcium: 9.4 mg/dL (ref 8.6–10.2)
Chloride: 103 mmol/L (ref 96–106)
Creatinine, Ser: 1.02 mg/dL (ref 0.76–1.27)
Globulin, Total: 2.1 g/dL (ref 1.5–4.5)
Glucose: 96 mg/dL (ref 70–99)
Potassium: 4.4 mmol/L (ref 3.5–5.2)
Sodium: 140 mmol/L (ref 134–144)
Total Protein: 6.5 g/dL (ref 6.0–8.5)
eGFR: 84 mL/min/{1.73_m2} (ref >59)

## 2021-01-12 LAB — CBC
Hematocrit: 43.2 % (ref 37.5–51.0)
Hemoglobin: 14.4 g/dL (ref 13.0–17.7)
MCH: 28 pg (ref 26.6–33.0)
MCHC: 33.3 g/dL (ref 31.5–35.7)
MCV: 84 fL (ref 79–97)
Platelets: 224 10*3/uL (ref 150–450)
RBC: 5.14 x10E6/uL (ref 4.14–5.80)
RDW: 12.9 % (ref 11.6–15.4)
WBC: 4.3 10*3/uL (ref 3.4–10.8)

## 2021-01-12 LAB — PSA: Prostate Specific Ag, Serum: 0.9 ng/mL (ref 0.0–4.0)

## 2021-01-12 LAB — TSH: TSH: 1.8 u[IU]/mL (ref 0.450–4.500)

## 2021-01-19 ENCOUNTER — Other Ambulatory Visit (HOSPITAL_COMMUNITY): Payer: Self-pay

## 2021-01-24 ENCOUNTER — Other Ambulatory Visit (HOSPITAL_COMMUNITY): Payer: Self-pay

## 2021-02-12 ENCOUNTER — Other Ambulatory Visit (HOSPITAL_COMMUNITY): Payer: Self-pay

## 2021-02-12 MED FILL — Tamsulosin HCl Cap 0.4 MG: ORAL | 30 days supply | Qty: 30 | Fill #6 | Status: AC

## 2021-02-25 ENCOUNTER — Encounter: Payer: Self-pay | Admitting: Gastroenterology

## 2021-03-15 ENCOUNTER — Other Ambulatory Visit (HOSPITAL_COMMUNITY): Payer: Self-pay

## 2021-03-15 MED FILL — Tamsulosin HCl Cap 0.4 MG: ORAL | 30 days supply | Qty: 30 | Fill #7 | Status: AC

## 2021-03-16 ENCOUNTER — Other Ambulatory Visit (HOSPITAL_COMMUNITY): Payer: Self-pay

## 2021-04-13 ENCOUNTER — Other Ambulatory Visit: Payer: Self-pay | Admitting: Urology

## 2021-04-13 ENCOUNTER — Other Ambulatory Visit (HOSPITAL_COMMUNITY): Payer: Self-pay

## 2021-04-17 ENCOUNTER — Encounter: Payer: Self-pay | Admitting: Gastroenterology

## 2021-04-18 ENCOUNTER — Other Ambulatory Visit (HOSPITAL_COMMUNITY): Payer: Self-pay

## 2021-05-15 ENCOUNTER — Ambulatory Visit (AMBULATORY_SURGERY_CENTER): Payer: Self-pay | Admitting: *Deleted

## 2021-05-15 ENCOUNTER — Other Ambulatory Visit (HOSPITAL_COMMUNITY): Payer: Self-pay

## 2021-05-15 ENCOUNTER — Other Ambulatory Visit: Payer: Self-pay

## 2021-05-15 VITALS — Ht 66.0 in | Wt 158.0 lb

## 2021-05-15 DIAGNOSIS — Z86010 Personal history of colonic polyps: Secondary | ICD-10-CM

## 2021-05-15 DIAGNOSIS — Z800 Family history of malignant neoplasm of digestive organs: Secondary | ICD-10-CM

## 2021-05-15 MED ORDER — PEG 3350-KCL-NA BICARB-NACL 420 G PO SOLR
4000.0000 mL | Freq: Once | ORAL | 0 refills | Status: AC
  Filled 2021-05-15: qty 4000, 1d supply, fill #0

## 2021-05-15 NOTE — Progress Notes (Signed)
No egg or soy allergy known to patient  No issues known to pt with past sedation with any surgeries or procedures Patient denies ever being told they had issues or difficulty with intubation  No FH of Malignant Hyperthermia Pt is not on diet pills Pt is not on  home 02  Pt is not on blood thinners  Pt denies issues with constipation at times- [facility] uses OTC stool softener ans has tried a prescription Clearlax --  has a BM 1 x a day and occ every 2 days - varies from hard to soft - will have pt do 1 capful of his Clearlax daily 5 days prior to prep   No A fib or A flutter  Pt is fully vaccinated  for Covid    NO PA's for preps discussed with pt In PV today  Discussed with pt there will be an out-of-pocket cost for prep and that varies from $0 to 70 +  dollars - pt verbalized understanding   Due to the COVID-19 pandemic we are asking patients to follow certain guidelines in PV and the [place]   Pt aware of COVID protocols and [facility] guidelines   PV completed over the phone. Pt verified name, DOB, address and insurance during PV today.  Pt mailed instruction packet with copy of consent form to read and not return, and instructions.  Pt encouraged to call with questions or issues.  If pt has My chart, procedure instructions sent via My Chart

## 2021-05-29 ENCOUNTER — Encounter: Payer: Self-pay | Admitting: Gastroenterology

## 2021-06-01 ENCOUNTER — Encounter: Payer: Self-pay | Admitting: Gastroenterology

## 2021-06-04 ENCOUNTER — Ambulatory Visit (AMBULATORY_SURGERY_CENTER): Payer: 59 | Admitting: Gastroenterology

## 2021-06-04 ENCOUNTER — Encounter: Payer: Self-pay | Admitting: Gastroenterology

## 2021-06-04 VITALS — BP 105/69 | HR 55 | Temp 98.9°F | Resp 17 | Ht 66.0 in | Wt 158.0 lb

## 2021-06-04 DIAGNOSIS — D124 Benign neoplasm of descending colon: Secondary | ICD-10-CM | POA: Diagnosis not present

## 2021-06-04 DIAGNOSIS — Z86010 Personal history of colonic polyps: Secondary | ICD-10-CM | POA: Diagnosis not present

## 2021-06-04 DIAGNOSIS — Z800 Family history of malignant neoplasm of digestive organs: Secondary | ICD-10-CM

## 2021-06-04 MED ORDER — SODIUM CHLORIDE 0.9 % IV SOLN: 500.0000 mL | Freq: Once | INTRAVENOUS | Status: DC

## 2021-06-04 NOTE — Patient Instructions (Signed)
Handouts provided on polyps and hemorrhoids.   YOU HAD AN ENDOSCOPIC PROCEDURE TODAY AT THE [facility]:   Refer to the procedure report that was given to you for any specific questions about what was found during the examination.  If the procedure report does not answer your questions, please call your gastroenterologist to clarify.  If you requested that your care partner not be given the details of your procedure findings, then the procedure report has been included in a sealed envelope for you to review at your convenience later.  YOU SHOULD EXPECT: Some feelings of bloating in the abdomen. Passage of more gas than usual.  Walking can help get rid of the air that was put into your GI tract during the procedure and reduce the bloating. If you had a lower endoscopy (such as a colonoscopy or flexible sigmoidoscopy) you may notice spotting of blood in your stool or on the toilet paper. If you underwent a bowel prep for your procedure, you may not have a normal bowel movement for a few days.  Please Note:  You might notice some irritation and congestion in your nose or some drainage.  This is from the oxygen used during your procedure.  There is no need for concern and it should clear up in a day or so.  SYMPTOMS TO REPORT IMMEDIATELY:  Following lower endoscopy (colonoscopy or flexible sigmoidoscopy):  Excessive amounts of blood in the stool  Significant tenderness or worsening of abdominal pains  Swelling of the abdomen that is new, acute  Fever of 100F or higher  For urgent or emergent issues, a gastroenterologist can be reached at any hour by calling [phone]. Do not use MyChart messaging for urgent concerns.    DIET:  We do recommend a small meal at first, but then you may proceed to your regular diet.  Drink plenty of fluids but you should avoid alcoholic beverages for 24 hours.  ACTIVITY:  You should plan to take it easy for the rest of today and you should NOT DRIVE  or use heavy machinery until tomorrow (because of the sedation medicines used during the test).    FOLLOW UP: Our staff will call the number listed on your records 48-72 hours following your procedure to check on you and address any questions or concerns that you may have regarding the information given to you following your procedure. If we do not reach you, we will leave a message.  We will attempt to reach you two times.  During this call, we will ask if you have developed any symptoms of COVID 19. If you develop any symptoms (ie: fever, flu-like symptoms, shortness of breath, cough etc.) before then, please call [phone].  If you test positive for Covid 19 in the 2 weeks post procedure, please call and report this information to us.    If any biopsies were taken you will be contacted by phone or by letter within the next 1-3 weeks.  Please call us at [phone] if you have not heard about the biopsies in 3 weeks.    SIGNATURES/CONFIDENTIALITY: You and/or your care partner have signed paperwork which will be entered into your electronic medical record.  These signatures attest to the fact that that the information above on your After Visit Summary has been reviewed and is understood.  Full responsibility of the confidentiality of this discharge information lies with you and/or your care-partner.  

## 2021-06-04 NOTE — Progress Notes (Signed)
HPI: This is a man with h/o polyps  Colonoscopy 03/2018 Dr. Ardis Hughs four subCM polyps removed, three were adenomas   ROS: complete GI ROS as described in HPI, all other review negative.  Constitutional:  No unintentional weight loss   Past Medical History:  Diagnosis Date   Abdominal pain 2009   CT abd pelvis /chronic   Allergy    seasonal   BPH (benign prostatic hyperplasia)    was on tamsulosin but had stopped   Hyperlipidemia    on statin daily   Hypertension    Neuromuscular disorder ([place])    neuropathy feet   RECTAL PAIN 12/08/2007   Chronic - had evaluation by Dr Sharlett Iles in 2009 including EGD, colonscopy, biopsy and abdomen CT.   Repeat abdomen CT in 06/2010 also negative      Sleep apnea    off cpap -  mild now since lost weight    Past Surgical History:  Procedure Laterality Date   CIRCUMCISION     Foreskin still partially attached to glans   COLONOSCOPY  2009   [facility]   HEMORRHOID SURGERY  2007   [facility]   POLYPECTOMY     2019 TA x 3   UPPER GASTROINTESTINAL ENDOSCOPY  2009   [facility]    Current Outpatient Medications  Medication Sig Dispense Refill   atorvastatin (LIPITOR) 10 MG tablet Take 1 tablet (10 mg total) by mouth daily. 90 tablet 3   COLLAGEN PO Take by mouth. Collagen Gummies-     finasteride (PROPECIA) 1 MG tablet Take 1 tablet (1 mg total) by mouth daily. 90 tablet 0   losartan (COZAAR) 50 MG tablet Take 1 tablet (50 mg total) by mouth daily. 90 tablet 2   tamsulosin (FLOMAX) 0.4 MG CAPS capsule Take 0.4 mg by mouth. (Patient not taking: Reported on 05/15/2021)     Current Facility-Administered Medications  Medication Dose Route Frequency Provider Last Rate Last Admin   0.9 %  sodium chloride infusion  500 mL Intravenous Once Milus Banister, MD        Allergies as of 06/04/2021   (No Known Allergies)    Family History  Problem Relation Age of Onset   Hypertension Mother    Diabetes Mother    Sudden death Father    Diabetes  Sister    Colon cancer Sister        died age 92   Brain cancer Sister    Diabetes Brother    Pneumonia Brother    Diabetes Cousin    Colon polyps Neg Hx    Esophageal cancer Neg Hx    Stomach cancer Neg Hx    Rectal cancer Neg Hx     Social History   Socioeconomic History   Marital status: Married    Spouse name: Not on file   Number of children: Not on file   Years of education: Not on file   Highest education level: Not on file  Occupational History   Not on file  Tobacco Use   Smoking status: Former    Packs/day: 2.00    Years: 28.00    Pack years: 56.00    Types: Cigarettes    Quit date: 2004    Years since quitting: 19.1   Smokeless tobacco: Never   Tobacco comments:    smoked for 28 yr- smoked up to 2-3ppd. quit smoking 45yr ago  Vaping Use   Vaping Use: Never used  Substance and Sexual Activity   Alcohol use:  No   Drug use: No   Sexual activity: Yes    Partners: Female  Other Topics Concern   Not on file  Social History Narrative   Born [place];   Brother is a physician in middle [place]   Has several children near by   Works as a Geophysicist/field seismologist   No other real hobbies    April 2018      Caffeine: tea, minimum 4 cups/day   Social Determinants of Health   Financial Resource Strain: Not on file  Food Insecurity: Not on file  Transportation Needs: Not on file  Physical Activity: Not on file  Stress: Not on file  Social Connections: Not on file  Intimate Partner Violence: Not on file     Physical Exam: BP (!) 143/81    Pulse (!) 58    Temp 98.9 F (37.2 C)    Ht 5\' 6"  (1.676 m)    Wt 158 lb (71.7 kg)    SpO2 99%    BMI 25.50 kg/m  Constitutional: generally well-appearing Psychiatric: alert and oriented x3 Lungs: CTA bilaterally Heart: no MCR  Assessment and plan: 61 y.o. male with h/o colon polyps  Surveillance colonoscopy today  Care is appropriate for the ambulatory setting.  Owens Loffler, MD [facility] 06/04/2021, 2:18  PM

## 2021-06-04 NOTE — Op Note (Signed)
[facility] Patient Name: [name] Procedure Date: 06/04/2021 2:16 PM MRN: [id number] Endoscopist: Milus Banister , MD Age: 61 Referring MD:  Date of Birth: May 10, 1960 Gender: Male Account #: [id number] Procedure:                Colonoscopy Indications:              High risk colon cancer surveillance: Personal                            history of colonic polyps; colonoscopy 03/2018 Dr.                            Ardis Hughs four subCM polyps removed, three were                            adenomas Medicines:                Monitored Anesthesia Care Procedure:                Pre-Anesthesia Assessment:                           - Prior to the procedure, a History and Physical                            was performed, and patient medications and                            allergies were reviewed. The patient's tolerance of                            previous anesthesia was also reviewed. The risks                            and benefits of the procedure and the sedation                            options and risks were discussed with the patient.                            All questions were answered, and informed consent                            was obtained. Prior Anticoagulants: The patient has                            taken no previous anticoagulant or antiplatelet                            agents. ASA Grade Assessment: II - A patient with                            mild systemic disease. After reviewing the risks  and benefits, the patient was deemed in                            satisfactory condition to undergo the procedure.                           After obtaining informed consent, the colonoscope                            was passed under direct vision. Throughout the                            procedure, the patient's blood pressure, pulse, and                            oxygen saturations were monitored continuously. The                             Olympus CF-HQ190L ([id number]) Colonoscope was                            introduced through the anus and advanced to the the                            cecum, identified by appendiceal orifice and                            ileocecal valve. The colonoscopy was performed                            without difficulty. The patient tolerated the                            procedure well. The quality of the bowel                            preparation was good. The ileocecal valve,                            appendiceal orifice, and rectum were photographed. Scope In: 2:26:26 PM Scope Out: 2:39:13 PM Scope Withdrawal Time: 0 hours 9 minutes 40 seconds  Total Procedure Duration: 0 hours 12 minutes 47 seconds  Findings:                 A 5 mm polyp was found in the descending colon. The                            polyp was sessile. The polyp was removed with a                            cold snare. Resection and retrieval were complete.                           Unchanged unusual appearance of distal rectum (vs  2019 colonoscopy and 2009 examination by Dr.                            Sharlett Iles) near hemorrhoid surgery staple line.                           The exam was otherwise without abnormality on                            direct and retroflexion views. Complications:            No immediate complications. Estimated blood loss:                            None. Estimated Blood Loss:     Estimated blood loss: none. Impression:               - One 5 mm polyp in the descending colon, removed                            with a cold snare. Resected and retrieved.                           - Unchanged unusual appearance of distal rectum (vs                            2019 colonoscopy and 2009 examination by Dr.                            Sharlett Iles) near hemorrhoid surgery staple line.                           - The examination was otherwise normal on  direct                            and retroflexion views. Recommendation:           - Patient has a contact number available for                            emergencies. The signs and symptoms of potential                            delayed complications were discussed with the                            patient. Return to normal activities tomorrow.                            Written discharge instructions were provided to the                            patient.                           - Resume previous diet.                           -  Continue present medications.                           - Await pathology results. Milus Banister, MD 06/04/2021 2:42:13 PM This report has been signed electronically.

## 2021-06-04 NOTE — Progress Notes (Signed)
Pt. Reports no change in his medical or surgical history since his pre-visit 05/15/2021.

## 2021-06-04 NOTE — Progress Notes (Signed)
Called to room to assist during endoscopic procedure.  Patient ID and intended procedure confirmed with present staff. Received instructions for my participation in the procedure from the performing physician.  

## 2021-06-04 NOTE — Progress Notes (Signed)
To Pacu, VSS. Report to [email or web address] ?

## 2021-06-06 ENCOUNTER — Telehealth: Payer: Self-pay

## 2021-06-06 NOTE — Telephone Encounter (Signed)
First attempt follow up call to pt, no answer. 

## 2021-06-06 NOTE — Telephone Encounter (Signed)
?  Follow up Call- ? ?Call back number 06/04/2021  ?Post procedure Call Back phone  # [phone]  ?Permission to leave phone message Yes  ?Some recent data might be hidden  ?  ? ?Patient questions: ? ?Do you have a fever, pain , or abdominal swelling? No. ?Pain Score  0 * ? ?Have you tolerated food without any problems? Yes.   ? ?Have you been able to return to your normal activities? Yes.   ? ?Do you have any questions about your discharge instructions: ?Diet   No. ?Medications  No. ?Follow up visit  No. ? ?Do you have questions or concerns about your Care? No. ? ?Actions: ?* If pain score is 4 or above: ?No action needed, pain <4. ? ?Have you developed a fever since your procedure? no ? ?2.   Have you had an respiratory symptoms (SOB or cough) since your procedure? no ? ?3.   Have you tested positive for COVID 19 since your procedure no ? ?4.   Have you had any family members/close contacts diagnosed with the COVID 19 since your procedure?  no ? ? ?If yes to any of these questions please route to Joylene John, RN and Joella Prince, RN  ? ? ?

## 2021-06-11 ENCOUNTER — Ambulatory Visit (INDEPENDENT_AMBULATORY_CARE_PROVIDER_SITE_OTHER): Payer: 59 | Admitting: Cardiology

## 2021-06-11 ENCOUNTER — Encounter: Payer: Self-pay | Admitting: Gastroenterology

## 2021-06-11 ENCOUNTER — Other Ambulatory Visit: Payer: Self-pay

## 2021-06-11 VITALS — BP 126/72 | HR 56 | Ht 66.0 in | Wt 162.0 lb

## 2021-06-11 DIAGNOSIS — E785 Hyperlipidemia, unspecified: Secondary | ICD-10-CM | POA: Diagnosis not present

## 2021-06-11 DIAGNOSIS — R0789 Other chest pain: Secondary | ICD-10-CM

## 2021-06-11 DIAGNOSIS — I10 Essential (primary) hypertension: Secondary | ICD-10-CM

## 2021-06-11 NOTE — Patient Instructions (Signed)
Medication Instructions:  ?Your physician recommends that you continue on your current medications as directed. Please refer to the Current Medication list given to you today. ? ?*If you need a refill on your cardiac medications before your next appointment, please call your pharmacy* ? ? ?Lab Work: ?None ?If you have labs (blood work) drawn today and your tests are completely normal, you will receive your results only by: ?MyChart Message (if you have MyChart) OR ?A paper copy in the mail ?If you have any lab test that is abnormal or we need to change your treatment, we will call you to review the results. ? ? ?Testing/Procedures: ?None ? ? ?Follow-Up: ?At [facility], you and your health needs are our priority.  As part of our continuing mission to provide you with exceptional heart care, we have created designated Provider Care Teams.  These Care Teams include your primary Cardiologist (physician) and Advanced Practice Providers (APPs -  Physician Assistants and Nurse Practitioners) who all work together to provide you with the care you need, when you need it. ? ?We recommend signing up for the patient portal called "MyChart".  Sign up information is provided on this After Visit Summary.  MyChart is used to connect with patients for Virtual Visits (Telemedicine).  Patients are able to view lab/test results, encounter notes, upcoming appointments, etc.  Non-urgent messages can be sent to your provider as well.   ?To learn more about what you can do with MyChart, go to [email or web address].   ? ?Your next appointment:   ?1 year(s) ? ?The format for your next appointment:   ?In Person ? ?Provider:   ?Jenne Campus, MD  ? ? ?Other Instructions ?None ? ?

## 2021-06-11 NOTE — Progress Notes (Signed)
?Cardiology Office Note:   ? ?Date:  06/11/2021  ? ?ID:  [place], DOB [birth year], MRN [id number] ? ?PCP:  Lind Covert, MD  ?Cardiologist:  Jenne Campus, MD   ? ?Referring MD: Lind Covert, *  ? ?Chief Complaint  ?Patient presents with  ? Follow-up  ?   ?  ?Doing fine ? ?History of Present Illness:   ? ?[name] is a 61 y.o. male with past medical history significant for essential hypertension, obstructive sleep apnea, dyslipidemia.  He is in my office today for follow-up.  Overall he is doing well.  He denies of any chest pain tightness squeezing pressure burning chest.  He lost some weight since I seen him last time he accomplished this by cutting down food.  He is looking forward to monitor from a done which will be next month. ? ?Past Medical History:  ?Diagnosis Date  ? Abdominal pain 2009  ? CT abd pelvis /chronic  ? Allergy   ? seasonal  ? BPH (benign prostatic hyperplasia)   ? was on tamsulosin but had stopped  ? Hyperlipidemia   ? on statin daily  ? Hypertension   ? Neuromuscular disorder ([place])   ? neuropathy feet  ? RECTAL PAIN 12/08/2007  ? Chronic - had evaluation by Dr Sharlett Iles in 2009 including EGD, colonscopy, biopsy and abdomen CT.   Repeat abdomen CT in 06/2010 also negative     ? Sleep apnea   ? off cpap -  mild now since lost weight  ? ? ?Past Surgical History:  ?Procedure Laterality Date  ? CIRCUMCISION    ? Foreskin still partially attached to glans  ? COLONOSCOPY  2009  ? [facility]  ? HEMORRHOID SURGERY  2007  ? [facility]  ? POLYPECTOMY    ? 2019 TA x 3  ? UPPER GASTROINTESTINAL ENDOSCOPY  2009  ? Sharlett Iles  ? ? ?Current Medications: ?Current Meds  ?Medication Sig  ? atorvastatin (LIPITOR) 10 MG tablet Take 1 tablet (10 mg total) by mouth daily.  ? COLLAGEN PO Take 1 tablet by mouth daily. Collagen Gummies-Unknown strength  ? finasteride (PROPECIA) 1 MG tablet Take 1 tablet (1 mg total) by mouth daily.  ? losartan (COZAAR) 50 MG tablet Take 1 tablet (50 mg  total) by mouth daily.  ?  ? ?Allergies:   Patient has no known allergies.  ? ?Social History  ? ?Socioeconomic History  ? Marital status: Married  ?  Spouse name: Not on file  ? Number of children: Not on file  ? Years of education: Not on file  ? Highest education level: Not on file  ?Occupational History  ? Not on file  ?Tobacco Use  ? Smoking status: Former  ?  Packs/day: 2.00  ?  Years: 28.00  ?  Pack years: 56.00  ?  Types: Cigarettes  ?  Quit date: 2004  ?  Years since quitting: 19.1  ? Smokeless tobacco: Never  ? Tobacco comments:  ?  smoked for 28 yr- smoked up to 2-3ppd. quit smoking 54yrago  ?Vaping Use  ? Vaping Use: Never used  ?Substance and Sexual Activity  ? Alcohol use: No  ? Drug use: No  ? Sexual activity: Yes  ?  Partners: Female  ?Other Topics Concern  ? Not on file  ?Social History Narrative  ? [place]  ? Brother is a pEngineer, drillingin [place] ? Has several children near by  ? Works as a  driver  ? No other real hobbies   ? April 2018  ?   ? Caffeine: tea, minimum 4 cups/day  ? ?Social Determinants of Health  ? ?Financial Resource Strain: Not on file  ?Food Insecurity: Not on file  ?Transportation Needs: Not on file  ?Physical Activity: Not on file  ?Stress: Not on file  ?Social Connections: Not on file  ?  ? ?Family History: ?The patient's family history includes Brain cancer in his sister; Colon cancer in his sister; Diabetes in his brother, cousin, mother, and sister; Hypertension in his mother; Pneumonia in his brother; Sudden death in his father. There is no history of Colon polyps, Esophageal cancer, Stomach cancer, or Rectal cancer. ?ROS:   ?Please see the history of present illness.    ?All 14 point review of systems negative except as described per history of present illness ? ?EKGs/Labs/Other Studies Reviewed:   ? ? ? ?Recent Labs: ?01/11/2021: ALT 15; BUN 9; Creatinine, Ser 1.02; Hemoglobin 14.4; Platelets 224; Potassium 4.4; Sodium 140; TSH 1.800  ?Recent Lipid Panel ?    ?Component Value Date/Time  ? CHOL 108 08/15/2020 1138  ? TRIG 80 08/15/2020 1138  ? HDL 34 (L) 08/15/2020 1138  ? CHOLHDL 3.2 08/15/2020 1138  ? CHOLHDL 4.9 07/19/2013 1012  ? VLDL 26 07/19/2013 1012  ? [place] 58 08/15/2020 1138  ? ? ?Physical Exam:   ? ?VS:  BP 126/72 (BP Location: Right Arm, Patient Position: Sitting)   Pulse (!) 56   Ht '5\' 6"'$  (1.676 m)   Wt 162 lb (73.5 kg)   SpO2 99%   BMI 26.15 kg/m?    ? ?Wt Readings from Last 3 Encounters:  ?06/11/21 162 lb (73.5 kg)  ?06/04/21 158 lb (71.7 kg)  ?05/15/21 158 lb (71.7 kg)  ?  ? ?GEN:  Well nourished, well developed in no acute distress ?HEENT: Normal ?NECK: No JVD; No carotid bruits ?LYMPHATICS: No lymphadenopathy ?CARDIAC: RRR, no murmurs, no rubs, no gallops ?RESPIRATORY:  Clear to auscultation without rales, wheezing or rhonchi  ?ABDOMEN: Soft, non-tender, non-distended ?MUSCULOSKELETAL:  No edema; No deformity  ?SKIN: Warm and dry ?LOWER EXTREMITIES: no swelling ?NEUROLOGIC:  Alert and oriented x 3 ?PSYCHIATRIC:  Normal affect  ? ?ASSESSMENT:   ? ?1. Essential hypertension   ?2. Atypical chest pain   ?3. Dyslipidemia, goal LDL below 100   ? ?PLAN:   ? ?In order of problems listed above: ? ?Essential hypertension: Blood pressure well controlled continue present management. ?Dyslipidemia I did review his K PN which only his LDL 58 HDL 34 this is from May of last year, he is on moderate dose statin follow-up Lipitor 10 which I will continue ?Atypical chest pain denies having any. ?We talked in length about healthy lifestyle need to exercise on the regular basis which I strongly recommend to do.  He is already keeping good weight with diet.  Advised to continue ? ? ?Medication Adjustments/Labs and Tests Ordered: ?Current medicines are reviewed at length with the patient today.  Concerns regarding medicines are outlined above.  ?No orders of the defined types were placed in this encounter. ? ?Medication changes: No orders of the defined types were  placed in this encounter. ? ? ?Signed, ?Park Liter, MD, [facility] ?06/11/2021 10:40 AM    ?[place] ?

## 2021-06-14 ENCOUNTER — Other Ambulatory Visit (HOSPITAL_COMMUNITY): Payer: Self-pay

## 2021-06-15 ENCOUNTER — Other Ambulatory Visit (HOSPITAL_COMMUNITY): Payer: Self-pay

## 2021-07-13 ENCOUNTER — Other Ambulatory Visit (HOSPITAL_COMMUNITY): Payer: Self-pay

## 2021-08-13 ENCOUNTER — Other Ambulatory Visit: Payer: Self-pay | Admitting: Family Medicine

## 2021-08-13 ENCOUNTER — Other Ambulatory Visit (HOSPITAL_COMMUNITY): Payer: Self-pay

## 2021-08-13 MED ORDER — LOSARTAN POTASSIUM 50 MG PO TABS
50.0000 mg | ORAL_TABLET | Freq: Every day | ORAL | 2 refills | Status: DC
  Filled 2021-08-13: qty 90, 90d supply, fill #0
  Filled 2021-11-13: qty 90, 90d supply, fill #1
  Filled 2022-02-11: qty 90, 90d supply, fill #2

## 2021-08-29 ENCOUNTER — Ambulatory Visit: Payer: 59 | Admitting: Adult Health

## 2021-09-11 ENCOUNTER — Encounter: Payer: Self-pay | Admitting: *Deleted

## 2021-09-24 ENCOUNTER — Other Ambulatory Visit: Payer: Self-pay | Admitting: Family Medicine

## 2021-09-25 ENCOUNTER — Other Ambulatory Visit (HOSPITAL_COMMUNITY): Payer: Self-pay

## 2021-09-25 ENCOUNTER — Ambulatory Visit: Payer: 59 | Admitting: Family Medicine

## 2021-09-25 ENCOUNTER — Other Ambulatory Visit: Payer: Self-pay | Admitting: Family Medicine

## 2021-09-25 MED ORDER — ATORVASTATIN CALCIUM 10 MG PO TABS
10.0000 mg | ORAL_TABLET | Freq: Every day | ORAL | 3 refills | Status: DC
  Filled 2021-09-25: qty 90, 90d supply, fill #0
  Filled 2021-12-27: qty 90, 90d supply, fill #1
  Filled 2022-03-28: qty 90, 90d supply, fill #2
  Filled 2022-07-10: qty 30, 30d supply, fill #3
  Filled 2022-08-08: qty 30, 30d supply, fill #4
  Filled 2022-09-10 – 2022-09-11 (×2): qty 30, 30d supply, fill #5

## 2021-09-26 ENCOUNTER — Other Ambulatory Visit (HOSPITAL_COMMUNITY): Payer: Self-pay

## 2021-09-26 MED ORDER — FINASTERIDE 1 MG PO TABS
1.0000 mg | ORAL_TABLET | Freq: Every day | ORAL | 1 refills | Status: DC
  Filled 2021-09-26: qty 90, 90d supply, fill #0
  Filled 2022-07-10: qty 90, 90d supply, fill #1

## 2021-10-03 ENCOUNTER — Ambulatory Visit: Payer: 59 | Admitting: Adult Health

## 2021-10-29 NOTE — Progress Notes (Unsigned)
    SUBJECTIVE:   CHIEF COMPLAINT / HPI:   Neck Pain About 1 week ago gradual onset of R lateral neck pain.  Worse with movement.  No trauma or weakness or fever or throat pain or hearing visual changes  Aleve seems to help  Chronic lower extremity pain - stable. Worse at night.  No weakness  PreDiabetes - trying to watch his weight and intake   PERTINENT  PMH / PSH: Saw Neurology Dr Rexene Alberts in 2021.  NCS were normal.  Felt perhaps due to prediabetes   OBJECTIVE:   BP 126/84   Pulse (!) 55   Ht '5\' 6"'$  (1.676 m)   Wt 160 lb 3.2 oz (72.7 kg)   SpO2 100%   BMI 25.86 kg/m   Neck - tender over R strap muscles.  No mass.  Mild decreased range of motion due to pain.  Normal thyroid.  No tenderness over vertebrae Throat: normal mucosa, no exudate, uvula midline, no redness Shoulders - FROM no weakness No lower extremity edema    ASSESSMENT/PLAN:   Neck pain on right side Consistent with muscle spasm from overuse or perhaps focal nerve compression.  No signs of weakness or mass lesions.  If not improving would check xray   Essential hypertension Well controlled continue current medications   Neuropathic pain of both feet Ok to try as needed at night gabapentin   Prediabetes A1c improved with weight control and diet      Lind Covert, MD [place]

## 2021-10-30 ENCOUNTER — Ambulatory Visit (INDEPENDENT_AMBULATORY_CARE_PROVIDER_SITE_OTHER): Payer: 59 | Admitting: Family Medicine

## 2021-10-30 ENCOUNTER — Other Ambulatory Visit (HOSPITAL_COMMUNITY): Payer: Self-pay

## 2021-10-30 VITALS — BP 126/84 | HR 55 | Ht 66.0 in | Wt 160.2 lb

## 2021-10-30 DIAGNOSIS — I10 Essential (primary) hypertension: Secondary | ICD-10-CM | POA: Diagnosis not present

## 2021-10-30 DIAGNOSIS — R7303 Prediabetes: Secondary | ICD-10-CM | POA: Insufficient documentation

## 2021-10-30 DIAGNOSIS — G629 Polyneuropathy, unspecified: Secondary | ICD-10-CM

## 2021-10-30 DIAGNOSIS — M542 Cervicalgia: Secondary | ICD-10-CM | POA: Insufficient documentation

## 2021-10-30 DIAGNOSIS — G5793 Unspecified mononeuropathy of bilateral lower limbs: Secondary | ICD-10-CM

## 2021-10-30 LAB — POCT GLYCOSYLATED HEMOGLOBIN (HGB A1C): HbA1c, POC (controlled diabetic range): 5.7 % (ref 0.0–7.0)

## 2021-10-30 MED ORDER — GABAPENTIN 100 MG PO CAPS
100.0000 mg | ORAL_CAPSULE | Freq: Every evening | ORAL | 3 refills | Status: DC | PRN
  Filled 2021-10-30: qty 30, 30d supply, fill #0
  Filled 2022-01-17: qty 30, 30d supply, fill #1
  Filled 2022-02-11 – 2022-03-11 (×2): qty 30, 30d supply, fill #2
  Filled 2022-03-28 – 2022-04-06 (×2): qty 30, 30d supply, fill #3

## 2021-10-30 NOTE — Assessment & Plan Note (Addendum)
Well controlled continue current medications

## 2021-10-30 NOTE — Patient Instructions (Addendum)
Good to see you today - Thank you for coming in  Things we discussed today: Neck Take aleve twice a day Use heat for 20 min three times a day Then do range of motion after thatn If not better in one month or worsening then let me know  Try gabapentin as needed at night when legs are bothering you  I sent a prescription to your pharmacy for your Zoster vaccines to help prevent Shingles.  The shot may cause a sore arm and mild flu like symptoms for a few days.  You will need a second shot 2 months after your first.

## 2021-10-30 NOTE — Assessment & Plan Note (Signed)
Ok to try as needed at night gabapentin

## 2021-10-30 NOTE — Assessment & Plan Note (Signed)
A1c improved with weight control and diet

## 2021-10-30 NOTE — Assessment & Plan Note (Addendum)
Consistent with muscle spasm from overuse or perhaps focal nerve compression.  No signs of weakness or mass lesions.  If not improving would check xray

## 2021-11-13 ENCOUNTER — Other Ambulatory Visit (HOSPITAL_COMMUNITY): Payer: Self-pay

## 2021-12-28 ENCOUNTER — Other Ambulatory Visit (HOSPITAL_COMMUNITY): Payer: Self-pay

## 2022-01-16 ENCOUNTER — Other Ambulatory Visit (HOSPITAL_COMMUNITY): Payer: Self-pay

## 2022-01-16 ENCOUNTER — Encounter: Payer: Self-pay | Admitting: Family Medicine

## 2022-01-16 ENCOUNTER — Ambulatory Visit (INDEPENDENT_AMBULATORY_CARE_PROVIDER_SITE_OTHER): Payer: Self-pay | Admitting: Family Medicine

## 2022-01-16 VITALS — BP 123/82 | HR 60 | Ht 66.0 in | Wt 164.4 lb

## 2022-01-16 DIAGNOSIS — M542 Cervicalgia: Secondary | ICD-10-CM

## 2022-01-16 DIAGNOSIS — I10 Essential (primary) hypertension: Secondary | ICD-10-CM

## 2022-01-16 DIAGNOSIS — Z23 Encounter for immunization: Secondary | ICD-10-CM

## 2022-01-16 MED ORDER — CETIRIZINE HCL 10 MG PO TABS
10.0000 mg | ORAL_TABLET | Freq: Every day | ORAL | 2 refills | Status: DC
  Filled 2022-01-16: qty 90, 90d supply, fill #0
  Filled 2022-09-10: qty 90, 90d supply, fill #1

## 2022-01-16 NOTE — Progress Notes (Signed)
    SUBJECTIVE:   CHIEF COMPLAINT / HPI:   Neck pain on right side Has had for several months.  Starts in upper neck and radiates down to shoulder.  No weakness or visual changes or focal neck pain.  Aleve and heat seem to help.  No history of trauma.  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  No lightheadness or chest pain   PERTINENT  PMH / PSH: recent eye exam was normal  OBJECTIVE:   BP 123/82   Pulse 60   Ht '5\' 6"'$  (1.676 m)   Wt 164 lb 6.4 oz (74.6 kg)   SpO2 100%   BMI 26.53 kg/m   Neck - FROM but with pain on R.  No masses. Carotid are nontender without bruit.  No vertebral body pain.    Shoulders - FROM Distal strength and sensation in RUE intact   ASSESSMENT/PLAN:   Essential hypertension At goal today.  Continue current medications.  Check labs for renal function   Neck pain on right side Consistent with nerve impingement or muscle strain.  Given persistence and age will check cspine and if nothing acute refer to Physical Therapy    Patient Instructions  Good to see you today - Thank you for coming in  Things we discussed today:  Neck Pain Will get xray and I will call you If ok will refer to Physical Therapy Keep taking aleve and heat Let me know if any weakness or visual changes  I will call you if your tests are not good.  Otherwise, I will send you a message on MyChart (if it is active) or a letter in the mail..  If you do not hear from me with in 2 weeks please call our office.       Lind Covert, MD [place]

## 2022-01-16 NOTE — Patient Instructions (Addendum)
Good to see you today - Thank you for coming in  Things we discussed today:  Neck Pain Will get xray and I will call you If ok will refer to Physical Therapy Keep taking aleve and heat Let me know if any weakness or visual changes  I will call you if your tests are not good.  Otherwise, I will send you a message on MyChart (if it is active) or a letter in the mail..  If you do not hear from me with in 2 weeks please call our office.

## 2022-01-16 NOTE — Assessment & Plan Note (Signed)
Consistent with nerve impingement or muscle strain.  Given persistence and age will check cspine and if nothing acute refer to Physical Therapy

## 2022-01-16 NOTE — Assessment & Plan Note (Signed)
At goal today.  Continue current medications.  Check labs for renal function

## 2022-01-17 ENCOUNTER — Ambulatory Visit
Admission: RE | Admit: 2022-01-17 | Discharge: 2022-01-17 | Disposition: A | Discharge status: Home / Self Care | Payer: 59 | Source: Ambulatory Visit | Attending: Family Medicine | Admitting: Family Medicine

## 2022-01-17 ENCOUNTER — Other Ambulatory Visit (HOSPITAL_COMMUNITY): Payer: Self-pay

## 2022-01-17 DIAGNOSIS — M542 Cervicalgia: Secondary | ICD-10-CM

## 2022-01-17 LAB — CMP14+EGFR
ALT: 17 IU/L (ref 0–44)
AST: 16 IU/L (ref 0–40)
Albumin/Globulin Ratio: 2.3 — ABNORMAL HIGH (ref 1.2–2.2)
Albumin: 4.8 g/dL (ref 3.9–4.9)
Alkaline Phosphatase: 48 IU/L (ref 44–121)
BUN/Creatinine Ratio: 10 (ref 10–24)
BUN: 8 mg/dL (ref 8–27)
Bilirubin Total: 0.3 mg/dL (ref 0.0–1.2)
CO2: 26 mmol/L (ref 20–29)
Calcium: 9.7 mg/dL (ref 8.6–10.2)
Chloride: 104 mmol/L (ref 96–106)
Creatinine, Ser: 0.82 mg/dL (ref 0.76–1.27)
Globulin, Total: 2.1 g/dL (ref 1.5–4.5)
Glucose: 95 mg/dL (ref 70–99)
Potassium: 4.3 mmol/L (ref 3.5–5.2)
Sodium: 141 mmol/L (ref 134–144)
Total Protein: 6.9 g/dL (ref 6.0–8.5)
eGFR: 100 mL/min/{1.73_m2} (ref >59)

## 2022-02-12 ENCOUNTER — Other Ambulatory Visit (HOSPITAL_COMMUNITY): Payer: Self-pay

## 2022-02-13 ENCOUNTER — Other Ambulatory Visit (HOSPITAL_COMMUNITY): Payer: Self-pay

## 2022-02-15 ENCOUNTER — Other Ambulatory Visit (HOSPITAL_COMMUNITY): Payer: Self-pay

## 2022-02-25 ENCOUNTER — Other Ambulatory Visit (HOSPITAL_COMMUNITY): Payer: Self-pay

## 2022-03-11 ENCOUNTER — Other Ambulatory Visit (HOSPITAL_COMMUNITY): Payer: Self-pay

## 2022-03-28 ENCOUNTER — Other Ambulatory Visit (HOSPITAL_COMMUNITY): Payer: Self-pay

## 2022-04-09 ENCOUNTER — Other Ambulatory Visit (HOSPITAL_COMMUNITY): Payer: Self-pay

## 2022-05-12 ENCOUNTER — Other Ambulatory Visit: Payer: Self-pay | Admitting: Family Medicine

## 2022-05-13 ENCOUNTER — Other Ambulatory Visit (HOSPITAL_COMMUNITY): Payer: Self-pay

## 2022-05-13 MED ORDER — LOSARTAN POTASSIUM 50 MG PO TABS
50.0000 mg | ORAL_TABLET | Freq: Every day | ORAL | 2 refills | Status: DC
  Filled 2022-05-13: qty 30, 30d supply, fill #0
  Filled 2022-06-11: qty 30, 30d supply, fill #1
  Filled 2022-07-10: qty 30, 30d supply, fill #2
  Filled 2022-08-08: qty 30, 30d supply, fill #3
  Filled 2022-09-10 – 2022-09-11 (×2): qty 30, 30d supply, fill #4
  Filled 2022-10-10: qty 30, 30d supply, fill #5
  Filled 2022-11-11: qty 30, 30d supply, fill #6
  Filled 2022-12-10: qty 30, 30d supply, fill #7
  Filled 2023-01-09: qty 30, 30d supply, fill #8

## 2022-05-15 ENCOUNTER — Other Ambulatory Visit: Payer: Self-pay

## 2022-05-15 ENCOUNTER — Other Ambulatory Visit (HOSPITAL_COMMUNITY): Payer: Self-pay

## 2022-05-15 ENCOUNTER — Ambulatory Visit (INDEPENDENT_AMBULATORY_CARE_PROVIDER_SITE_OTHER): Payer: Self-pay | Admitting: Family Medicine

## 2022-05-15 VITALS — BP 125/82 | HR 79 | Ht 66.0 in | Wt 164.2 lb

## 2022-05-15 DIAGNOSIS — B349 Viral infection, unspecified: Secondary | ICD-10-CM

## 2022-05-15 MED ORDER — IBUPROFEN 600 MG PO TABS
600.0000 mg | ORAL_TABLET | Freq: Three times a day (TID) | ORAL | 0 refills | Status: DC | PRN
  Filled 2022-05-15: qty 30, 10d supply, fill #0

## 2022-05-15 NOTE — Progress Notes (Signed)
    SUBJECTIVE:   CHIEF COMPLAINT / HPI:   URI  Major symptoms: achy dry cough mild fever mild sorethroat Progression: started 5 days ago fever is less yesterday Medications tried: tylenol Sick contacts: most of family Patient believes may be caused by virus  Symptoms Fever: low grade 2 days ago Headache or face pain: yes Tooth pain: no Sneezing: mild Scratchy throat: yes Allergies: no Muscle aches: yes Severe fatigue: no Stiff neck: no Shortness of breath: no Rash: no Sore throat or swollen glands: no    OBJECTIVE:   BP 125/82   Pulse 79   Ht '5\' 6"'$  (1.676 m)   Wt 164 lb 3.2 oz (74.5 kg)   SpO2 100%   BMI 26.50 kg/m   Alert nad Throat: normal mucosa, no exudate, uvula midline, no redness Neck:  No deformities, thyromegaly, masses, or tenderness noted.   Supple with full range of motion without pain. Heart - Regular rate and rhythm.  No murmurs, gallops or rubs.    Lungs:  Normal respiratory effort, chest expands symmetrically. Lungs are clear to auscultation, no crackles or wheezes. Nose - clear discharge with hoarse voice .skin  ASSESSMENT/PLAN:   Viral syndrome  Other orders -     Ibuprofen; Take 1 tablet (600 mg total) by mouth every 8 (eight) hours as needed.  Dispense: 30 tablet; Refill: 0    URI - no signs of pneumonia or sinusitis.  Most consistent with  virus. He has no serious medical conditions.  Will treat with nsaids and analgesics  Patient Instructions  Good to see you today - Thank you for coming in  Things we discussed today:  For the virus - Ibuprofen 600 mg three times a day as needed for aches - Nyquil at night - do not take with gabapentin - Chloraspetic spray for sore throat  If not better in 5 days or worsening with high fever or shortness of breath the let me know.     Lind Covert, MD [facility]

## 2022-05-15 NOTE — Patient Instructions (Signed)
Good to see you today - Thank you for coming in  Things we discussed today:  For the virus - Ibuprofen 600 mg three times a day as needed for aches - Nyquil at night - do not take with gabapentin - Chloraspetic spray for sore throat  If not better in 5 days or worsening with high fever or shortness of breath the let me know.

## 2022-06-10 ENCOUNTER — Other Ambulatory Visit (HOSPITAL_COMMUNITY): Payer: Self-pay

## 2022-07-10 ENCOUNTER — Other Ambulatory Visit (HOSPITAL_COMMUNITY): Payer: Self-pay

## 2022-07-10 ENCOUNTER — Other Ambulatory Visit: Payer: Self-pay

## 2022-09-10 ENCOUNTER — Other Ambulatory Visit: Payer: Self-pay

## 2022-09-10 ENCOUNTER — Encounter: Payer: Self-pay | Admitting: Family Medicine

## 2022-09-10 ENCOUNTER — Ambulatory Visit (INDEPENDENT_AMBULATORY_CARE_PROVIDER_SITE_OTHER): Payer: 59 | Admitting: Family Medicine

## 2022-09-10 ENCOUNTER — Other Ambulatory Visit (HOSPITAL_COMMUNITY): Payer: Self-pay

## 2022-09-10 VITALS — BP 142/82 | HR 56 | Ht 66.0 in | Wt 167.2 lb

## 2022-09-10 DIAGNOSIS — G629 Polyneuropathy, unspecified: Secondary | ICD-10-CM | POA: Diagnosis not present

## 2022-09-10 DIAGNOSIS — E785 Hyperlipidemia, unspecified: Secondary | ICD-10-CM

## 2022-09-10 DIAGNOSIS — I10 Essential (primary) hypertension: Secondary | ICD-10-CM | POA: Diagnosis not present

## 2022-09-10 MED ORDER — GABAPENTIN 100 MG PO CAPS
100.0000 mg | ORAL_CAPSULE | Freq: Every evening | ORAL | 3 refills | Status: DC | PRN
  Filled 2022-09-10: qty 30, 30d supply, fill #0
  Filled 2022-12-26: qty 30, 30d supply, fill #1
  Filled 2023-01-21: qty 30, 30d supply, fill #2
  Filled 2023-01-29 – 2023-03-04 (×6): qty 30, 30d supply, fill #3

## 2022-09-10 NOTE — Assessment & Plan Note (Signed)
Slightly above goal today perhaps due to insomnia.  Asked him to monitor at home. Continue losartan

## 2022-09-10 NOTE — Progress Notes (Signed)
    SUBJECTIVE:   CHIEF COMPLAINT / HPI:   Leg Discomfort Restlessness At night his legs often feel funny or tight or painful.  He has to get up and move or hit rub them.  Feel fine during the day.  No joint swelling or redness.  No rash Used to take gabapentin which helped but stopped.  Hypertension Taking losartan daily.  Does not normally check blood pressure  Lipids Taking 1/2 of 10 mg atorvastatin because read could cause side effects  OBJECTIVE:   BP (!) 142/82   Pulse (!) 56   Ht 5\' 6"  (1.676 m)   Wt 167 lb 3.2 oz (75.8 kg)   SpO2 98%   BMI 26.99 kg/m   Extremities:  No cyanosis, edema, or deformity noted with good range of motion of all major joints.   Neurologic exam : Able to walk on heels and toes and deep knee bend without symptoms Balance normal  Vitiligo on both lower legs No edema Good bilateral pulses and hair on toes Sensation intact to touch bilaterally   ASSESSMENT/PLAN:   Dyslipidemia, goal LDL below 100 Assessment & Plan: Self decreased his statin.  Will check lipid level   Orders: -     Lipid panel  Neuropathy Assessment & Plan: I think his leg symptoms are consistent with restless legs or neuropathy.  Suggested restart gabapentin 100 mg as needed    Essential hypertension Assessment & Plan: Slightly above goal today perhaps due to insomnia.  Asked him to monitor at home. Continue losartan    Other orders -     Gabapentin; Take 1 capsule (100 mg total) by mouth at bedtime as needed.  Dispense: 30 capsule; Refill: 3     Patient Instructions  Good to see you today - Thank you for coming in  Things we discussed today:  Restless Legs - try the gabapentin 100 mg as needed  - continue to be active  Cholesterol I will let you know your level  Your goal blood pressure is less than 140/90.  Check your blood pressure several times a week.  If regularly higher than this please let me know - either with MyChart or leaving a phone  message. Next visit please bring in your blood pressure cuff.     Please always bring your medication bottles  Come back to see me in 4 months    Carney Living, MD [facility]

## 2022-09-10 NOTE — Assessment & Plan Note (Signed)
>>  ASSESSMENT AND PLAN FOR NEUROPATHY WRITTEN ON 09/10/2022 11:13 AM BY Kemora Pinard L, MD  I think his leg symptoms are consistent with restless legs or neuropathy.  Suggested restart gabapentin 100 mg as needed

## 2022-09-10 NOTE — Patient Instructions (Signed)
Good to see you today - Thank you for coming in  Things we discussed today:  Restless Legs - try the gabapentin 100 mg as needed  - continue to be active  Cholesterol I will let you know your level  Your goal blood pressure is less than 140/90.  Check your blood pressure several times a week.  If regularly higher than this please let me know - either with MyChart or leaving a phone message. Next visit please bring in your blood pressure cuff.     Please always bring your medication bottles  Come back to see me in 4 months

## 2022-09-10 NOTE — Assessment & Plan Note (Signed)
I think his leg symptoms are consistent with restless legs or neuropathy.  Suggested restart gabapentin 100 mg as needed

## 2022-09-10 NOTE — Assessment & Plan Note (Signed)
Self decreased his statin.  Will check lipid level

## 2022-09-11 ENCOUNTER — Other Ambulatory Visit (HOSPITAL_COMMUNITY): Payer: Self-pay

## 2022-09-11 LAB — LIPID PANEL
Chol/HDL Ratio: 3.2 ratio (ref 0.0–5.0)
Cholesterol, Total: 139 mg/dL (ref 100–199)
HDL: 44 mg/dL (ref >39)
LDL Chol Calc (NIH): 80 mg/dL (ref 0–99)
Triglycerides: 75 mg/dL (ref 0–149)
VLDL Cholesterol Cal: 15 mg/dL (ref 5–40)

## 2022-10-28 ENCOUNTER — Other Ambulatory Visit: Payer: Self-pay | Admitting: Family Medicine

## 2022-10-29 ENCOUNTER — Other Ambulatory Visit (HOSPITAL_COMMUNITY): Payer: Self-pay

## 2022-10-29 MED ORDER — ATORVASTATIN CALCIUM 10 MG PO TABS
10.0000 mg | ORAL_TABLET | Freq: Every day | ORAL | 3 refills | Status: DC
  Filled 2022-10-29: qty 30, 30d supply, fill #0
  Filled 2022-12-02: qty 30, 30d supply, fill #1
  Filled 2022-12-26: qty 30, 30d supply, fill #2
  Filled 2023-01-29: qty 30, 30d supply, fill #3
  Filled 2023-03-05: qty 30, 30d supply, fill #4

## 2022-11-12 ENCOUNTER — Other Ambulatory Visit: Payer: Self-pay

## 2022-12-10 ENCOUNTER — Other Ambulatory Visit: Payer: Self-pay | Admitting: Family Medicine

## 2022-12-11 ENCOUNTER — Other Ambulatory Visit (HOSPITAL_COMMUNITY): Payer: Self-pay

## 2022-12-11 ENCOUNTER — Other Ambulatory Visit: Payer: Self-pay

## 2022-12-11 MED ORDER — FINASTERIDE 1 MG PO TABS
1.0000 mg | ORAL_TABLET | Freq: Every day | ORAL | 1 refills | Status: AC
Start: 2022-12-11 — End: ?
  Filled 2022-12-11: qty 90, 90d supply, fill #0
  Filled 2023-03-05: qty 90, 90d supply, fill #1

## 2022-12-13 ENCOUNTER — Other Ambulatory Visit (HOSPITAL_COMMUNITY): Payer: Self-pay

## 2023-01-10 ENCOUNTER — Other Ambulatory Visit (HOSPITAL_COMMUNITY): Payer: Self-pay

## 2023-01-15 ENCOUNTER — Other Ambulatory Visit (HOSPITAL_COMMUNITY): Payer: Self-pay

## 2023-01-15 ENCOUNTER — Ambulatory Visit (INDEPENDENT_AMBULATORY_CARE_PROVIDER_SITE_OTHER): Payer: 59 | Admitting: Family Medicine

## 2023-01-15 ENCOUNTER — Other Ambulatory Visit: Payer: Self-pay

## 2023-01-15 ENCOUNTER — Encounter: Payer: Self-pay | Admitting: Family Medicine

## 2023-01-15 VITALS — BP 128/75 | HR 62 | Ht 66.0 in | Wt 165.6 lb

## 2023-01-15 DIAGNOSIS — Z125 Encounter for screening for malignant neoplasm of prostate: Secondary | ICD-10-CM

## 2023-01-15 DIAGNOSIS — M542 Cervicalgia: Secondary | ICD-10-CM

## 2023-01-15 DIAGNOSIS — G5793 Unspecified mononeuropathy of bilateral lower limbs: Secondary | ICD-10-CM | POA: Diagnosis not present

## 2023-01-15 DIAGNOSIS — R4586 Emotional lability: Secondary | ICD-10-CM | POA: Diagnosis not present

## 2023-01-15 DIAGNOSIS — L80 Vitiligo: Secondary | ICD-10-CM | POA: Diagnosis not present

## 2023-01-15 DIAGNOSIS — R1084 Generalized abdominal pain: Secondary | ICD-10-CM | POA: Diagnosis not present

## 2023-01-15 DIAGNOSIS — Z23 Encounter for immunization: Secondary | ICD-10-CM | POA: Diagnosis not present

## 2023-01-15 DIAGNOSIS — R7303 Prediabetes: Secondary | ICD-10-CM | POA: Diagnosis not present

## 2023-01-15 DIAGNOSIS — I10 Essential (primary) hypertension: Secondary | ICD-10-CM

## 2023-01-15 LAB — POCT GLYCOSYLATED HEMOGLOBIN (HGB A1C): HbA1c, POC (prediabetic range): 5.9 % (ref 5.7–6.4)

## 2023-01-15 MED ORDER — FAMOTIDINE 20 MG PO TABS
20.0000 mg | ORAL_TABLET | Freq: Every evening | ORAL | 1 refills | Status: DC | PRN
Start: 2023-01-15 — End: 2023-08-19
  Filled 2023-01-15: qty 30, 30d supply, fill #0
  Filled 2023-04-04: qty 30, 30d supply, fill #1

## 2023-01-15 NOTE — Progress Notes (Deleted)
    SUBJECTIVE:   CHIEF COMPLAINT / HPI:   Lab Results  Component Value Date   CHOL 139 09/10/2022   HDL 44 09/10/2022   LDLCALC 80 09/10/2022   TRIG 75 09/10/2022   CHOLHDL 3.2 09/10/2022      OBJECTIVE:   There were no vitals taken for this visit.  ***  ASSESSMENT/PLAN:   There are no diagnoses linked to this encounter.   There are no Patient Instructions on file for this visit.   Carney Living, MD [facility]

## 2023-01-15 NOTE — Patient Instructions (Signed)
Good to see you today - Thank you for coming in  Things we discussed today:  Neck Strain Use tylenol if needed for pain Use heat three times a day and do range of motion exercises  Abdomen pain Take famotidine 20 mg at night as needed  Stop the Bismuth  Leg Pain Increase gabapentin to 200 mg at night  You should exercise at least 20 minutes every day.    Choose something you like the most or hate the least.   Having a set time every day and having a partner will help you stick to it.    If you are too tired try to do at least 5 minutes, it often gets easier.   Please always bring your medication bottles  I will call you if your tests are not good.  Otherwise, I will send you a message on MyChart (if it is active) or a letter in the mail..  If you do not hear from me with in 2 weeks please call our office.      Come back to see me in 1 month

## 2023-01-15 NOTE — Assessment & Plan Note (Signed)
Well controlled 

## 2023-01-15 NOTE — Assessment & Plan Note (Signed)
Well controlled continue losartan check labs

## 2023-01-15 NOTE — Assessment & Plan Note (Signed)
Worsening.  Seems consistent with idiopathic neuropathy.  Neurology work up in 2021 was unrevealing.  Increase gabapentin.  Given concurrent mood effects consider SSRI

## 2023-01-15 NOTE — Assessment & Plan Note (Signed)
Possibly related to gastric irritation.  No red flags.  Trial of pepcid

## 2023-01-15 NOTE — Assessment & Plan Note (Signed)
PHQ of 18 today.  Will discuss further if persists and lab work up is unrevealing

## 2023-01-15 NOTE — Progress Notes (Signed)
SUBJECTIVE:   CHIEF COMPLAINT / HPI:   Neck Pain Ran into a door about 4 days ago now pain in R side of his neck.  Wearing an old soft collar.  No weakness in UE.  Nose congestion Nose intermittently feels tender or congested.  No using cetirizine regularly  Abdomen pain Vague abdomen discomfort especially at night.  No nausea and vomiting or weight loss or bleeding.  Taking otc bismuth that may help a little  Leg pain  Continues to have bilateral leg pain worse at night but also now sometimes during the day.  Walks about an hour every day.  Takes 100 mg gabapentin nightly   OBJECTIVE:   BP 128/75   Pulse 62   Ht 5\' 6"  (1.676 m)   Wt 165 lb 9.6 oz (75.1 kg)   SpO2 100%   BMI 26.73 kg/m   Neck - near FROM but with pain on R movement. No bony tendernes Heart - Regular rate and rhythm.  No murmurs, gallops or rubs.    Lungs:  Normal respiratory effort, chest expands symmetrically. Lungs are clear to auscultation, no crackles or wheezes. Abdomen: soft and non-tender without masses, organomegaly or hernias noted.  No guarding or rebound Rectal - no masses small external hemorrhoidal tag.  No bleeding Extremities:  No cyanosis, edema, or deformity noted with good range of motion of all major joints.   Feet - good pulses and hair on toes Neurologic exam : Cn 2-7 intact Strength equal & normal in upper & lower extremities Able to walk on heels and toes.   Balance normal     ASSESSMENT/PLAN:   Essential hypertension Assessment & Plan: Well controlled continue losartan check labs  Orders: -     CMP14+EGFR  Prediabetes Assessment & Plan: Well controlled   Orders: -     POCT glycosylated hemoglobin (Hb A1C)  Screening for malignant neoplasm of prostate -     PSA  Generalized abdominal pain Assessment & Plan: Possibly related to gastric irritation.  No red flags.  Trial of pepcid  Orders: -     Famotidine; Take 1 tablet (20 mg total) by mouth at bedtime as  needed for heartburn or indigestion.  Dispense: 30 tablet; Refill: 1  Encounter for immunization -     Flu vaccine trivalent PF, 6mos and older(Flulaval,Afluria,Fluarix,Fluzone)  Neck pain on right side Assessment & Plan: Worsened consistent with mild strain.  No signs of instablity or nerve compression or fracture.  Conservative therapy   Neuropathic pain of both feet Assessment & Plan: Worsening.  Seems consistent with idiopathic neuropathy.  Neurology work up in 2021 was unrevealing.  Increase gabapentin.  Given concurrent mood effects consider SSRI    Mood change Assessment & Plan: PHQ of 18 today.  Will discuss further if persists and lab work up is unrevealing       Patient Instructions  Good to see you today - Thank you for coming in  Things we discussed today:  Neck Strain Use tylenol if needed for pain Use heat three times a day and do range of motion exercises  Abdomen pain Take famotidine 20 mg at night as needed  Stop the Bismuth  Leg Pain Increase gabapentin to 200 mg at night  You should exercise at least 20 minutes every day.    Choose something you like the most or hate the least.   Having a set time every day and having a partner will help you stick to  it.    If you are too tired try to do at least 5 minutes, it often gets easier.   Please always bring your medication bottles  I will call you if your tests are not good.  Otherwise, I will send you a message on MyChart (if it is active) or a letter in the mail..  If you do not hear from me with in 2 weeks please call our office.      Come back to see me in 1 month    Carney Living, MD [facility]

## 2023-01-15 NOTE — Assessment & Plan Note (Signed)
Worsened consistent with mild strain.  No signs of instablity or nerve compression or fracture.  Conservative therapy

## 2023-01-16 LAB — CMP14+EGFR
ALT: 16 [IU]/L (ref 0–44)
AST: 17 [IU]/L (ref 0–40)
Albumin: 4.4 g/dL (ref 3.9–4.9)
Alkaline Phosphatase: 44 [IU]/L (ref 44–121)
BUN/Creatinine Ratio: 8 — ABNORMAL LOW (ref 10–24)
BUN: 8 mg/dL (ref 8–27)
Bilirubin Total: 0.4 mg/dL (ref 0.0–1.2)
CO2: 24 mmol/L (ref 20–29)
Calcium: 9.1 mg/dL (ref 8.6–10.2)
Chloride: 104 mmol/L (ref 96–106)
Creatinine, Ser: 0.98 mg/dL (ref 0.76–1.27)
Globulin, Total: 2.1 g/dL (ref 1.5–4.5)
Glucose: 95 mg/dL (ref 70–99)
Potassium: 4.2 mmol/L (ref 3.5–5.2)
Sodium: 141 mmol/L (ref 134–144)
Total Protein: 6.5 g/dL (ref 6.0–8.5)
eGFR: 87 mL/min/{1.73_m2} (ref >59)

## 2023-01-16 LAB — PSA: Prostate Specific Ag, Serum: 0.9 ng/mL (ref 0.0–4.0)

## 2023-01-22 ENCOUNTER — Other Ambulatory Visit (HOSPITAL_COMMUNITY): Payer: Self-pay

## 2023-01-30 ENCOUNTER — Other Ambulatory Visit: Payer: Self-pay

## 2023-01-30 ENCOUNTER — Other Ambulatory Visit (HOSPITAL_COMMUNITY): Payer: Self-pay

## 2023-02-04 ENCOUNTER — Other Ambulatory Visit (HOSPITAL_COMMUNITY): Payer: Self-pay

## 2023-02-10 ENCOUNTER — Other Ambulatory Visit: Payer: Self-pay | Admitting: Family Medicine

## 2023-02-11 ENCOUNTER — Other Ambulatory Visit (HOSPITAL_COMMUNITY): Payer: Self-pay

## 2023-02-11 MED ORDER — LOSARTAN POTASSIUM 50 MG PO TABS
50.0000 mg | ORAL_TABLET | Freq: Every day | ORAL | 3 refills | Status: DC
Start: 2023-02-11 — End: 2023-03-26
  Filled 2023-02-11: qty 30, 30d supply, fill #0
  Filled 2023-03-11: qty 30, 30d supply, fill #1

## 2023-02-12 ENCOUNTER — Other Ambulatory Visit: Payer: Self-pay

## 2023-02-12 ENCOUNTER — Other Ambulatory Visit (HOSPITAL_COMMUNITY): Payer: Self-pay

## 2023-02-27 ENCOUNTER — Other Ambulatory Visit (HOSPITAL_COMMUNITY): Payer: Self-pay

## 2023-02-27 DIAGNOSIS — L80 Vitiligo: Secondary | ICD-10-CM | POA: Diagnosis not present

## 2023-02-27 DIAGNOSIS — L249 Irritant contact dermatitis, unspecified cause: Secondary | ICD-10-CM | POA: Diagnosis not present

## 2023-02-27 MED ORDER — OPZELURA 1.5 % EX CREA
1.0000 | TOPICAL_CREAM | Freq: Two times a day (BID) | CUTANEOUS | 11 refills | Status: AC
Start: 2023-02-27 — End: ?
  Filled 2023-02-27: qty 60, 30d supply, fill #0

## 2023-02-27 MED ORDER — TACROLIMUS 0.1 % EX OINT
1.0000 | TOPICAL_OINTMENT | Freq: Two times a day (BID) | CUTANEOUS | 2 refills | Status: AC
  Filled 2023-02-27: qty 60, 30d supply, fill #0

## 2023-02-28 ENCOUNTER — Other Ambulatory Visit (HOSPITAL_COMMUNITY): Payer: Self-pay

## 2023-03-04 ENCOUNTER — Other Ambulatory Visit (HOSPITAL_COMMUNITY): Payer: Self-pay

## 2023-03-07 ENCOUNTER — Other Ambulatory Visit (HOSPITAL_COMMUNITY): Payer: Self-pay

## 2023-03-26 ENCOUNTER — Ambulatory Visit (INDEPENDENT_AMBULATORY_CARE_PROVIDER_SITE_OTHER): Payer: 59 | Admitting: Family Medicine

## 2023-03-26 ENCOUNTER — Other Ambulatory Visit (HOSPITAL_COMMUNITY): Payer: Self-pay

## 2023-03-26 VITALS — BP 133/78 | HR 61 | Ht 66.0 in | Wt 167.4 lb

## 2023-03-26 DIAGNOSIS — R4586 Emotional lability: Secondary | ICD-10-CM | POA: Diagnosis not present

## 2023-03-26 DIAGNOSIS — E785 Hyperlipidemia, unspecified: Secondary | ICD-10-CM | POA: Diagnosis not present

## 2023-03-26 DIAGNOSIS — I10 Essential (primary) hypertension: Secondary | ICD-10-CM | POA: Diagnosis not present

## 2023-03-26 DIAGNOSIS — G5793 Unspecified mononeuropathy of bilateral lower limbs: Secondary | ICD-10-CM

## 2023-03-26 DIAGNOSIS — R1084 Generalized abdominal pain: Secondary | ICD-10-CM

## 2023-03-26 MED ORDER — LOSARTAN POTASSIUM 50 MG PO TABS
50.0000 mg | ORAL_TABLET | Freq: Every day | ORAL | 3 refills | Status: DC
  Filled 2023-03-26 (×2): qty 90, 90d supply, fill #0
  Filled 2023-07-07: qty 90, 90d supply, fill #1
  Filled 2023-10-04: qty 90, 90d supply, fill #2
  Filled 2024-01-04: qty 90, 90d supply, fill #3

## 2023-03-26 MED ORDER — ZOSTER VAC RECOMB ADJUVANTED 50 MCG/0.5ML IM SUSR
INTRAMUSCULAR | 1 refills | Status: AC
Start: 2023-03-26 — End: ?
  Filled 2023-03-26: qty 0.5, 1d supply, fill #0
  Filled 2023-03-26: qty 0.5, fill #0

## 2023-03-26 MED ORDER — GABAPENTIN 100 MG PO CAPS
200.0000 mg | ORAL_CAPSULE | Freq: Every evening | ORAL | 1 refills | Status: DC | PRN
Start: 2023-03-26 — End: 2024-01-04
  Filled 2023-03-26: qty 60, 30d supply, fill #0
  Filled 2023-03-26: qty 180, 90d supply, fill #0
  Filled 2023-08-19: qty 180, 90d supply, fill #1

## 2023-03-26 MED ORDER — ATORVASTATIN CALCIUM 10 MG PO TABS
10.0000 mg | ORAL_TABLET | Freq: Every day | ORAL | 3 refills | Status: DC
  Filled 2023-03-26 (×2): qty 90, 90d supply, fill #0
  Filled 2023-07-07: qty 90, 90d supply, fill #1
  Filled 2023-10-04: qty 90, 90d supply, fill #2
  Filled 2024-01-04: qty 90, 90d supply, fill #3

## 2023-03-26 NOTE — Patient Instructions (Addendum)
Good to see you today - Thank you for coming in  Things we discussed today:  Consider Shingles I sent a prescription to your pharmacy for your Zoster vaccines to help prevent Shingles.  The shot may cause a sore arm and mild flu like symptoms for a few days.  You will need a second shot 2 months after your first.    Come back to see your new doctor in 6 months  I will miss working with you - Be Well

## 2023-03-26 NOTE — Assessment & Plan Note (Signed)
Chronic problem that waxes and wanes.  Suggested as needed gabapentin and continued exercise.

## 2023-03-26 NOTE — Assessment & Plan Note (Signed)
Well controlled; continue losartan

## 2023-03-26 NOTE — Assessment & Plan Note (Signed)
Stable. Continue lipitor. 

## 2023-03-26 NOTE — Assessment & Plan Note (Signed)
He feels he is anxious about various things mostly his health but that this is stable.  He walks regularly and is not currently interested in counseling.

## 2023-03-26 NOTE — Assessment & Plan Note (Signed)
Improved with famotidine.  UTD on colon cancer screening

## 2023-03-26 NOTE — Progress Notes (Signed)
    SUBJECTIVE:   CHIEF COMPLAINT / HPI:   Neck Strain This feels much improved.   Abdomen pain His abdomen pain is much better with famotidine    Leg Pain He was not able to increase the gabapentin due to not getting a new Rx.  Has been taking low dose ibuprofen which helps.  Anxiety Continues to be anxious about many things including his health but feels this is manageable does not feel need to see a counselor   OBJECTIVE:   BP 133/78   Pulse 61   Ht 5\' 6"  (1.676 m)   Wt 167 lb 6.1 oz (75.9 kg)   SpO2 100%   BMI 27.02 kg/m   Heart - Regular rate and rhythm.  No murmurs, gallops or rubs.    Lungs:  Normal respiratory effort, chest expands symmetrically. Lungs are clear to auscultation, no crackles or wheezes. Extremities:  No cyanosis, edema, or deformity noted with good range of motion of all major joints.   Mobility:able to get up and down from exam table without assistance or distress    ASSESSMENT/PLAN:   Dyslipidemia, goal LDL below 100 Assessment & Plan: Stable Continue lipitor.  Orders: -     Atorvastatin Calcium; Take 1 tablet (10 mg total) by mouth daily.  Dispense: 90 tablet; Refill: 3  Essential hypertension Assessment & Plan: Well controlled continue losartan     Generalized abdominal pain Assessment & Plan: Improved with famotidine.  UTD on colon cancer screening   Neuropathic pain of both feet Assessment & Plan: Chronic problem that waxes and wanes.  Suggested as needed gabapentin and continued exercise.     Mood change Assessment & Plan: He feels he is anxious about various things mostly his health but that this is stable.  He walks regularly and is not currently interested in counseling.    Other orders -     Gabapentin; Take 2 capsules (200 mg total) by mouth at bedtime as needed.  Dispense: 180 capsule; Refill: 1 -     Losartan Potassium; Take 1 tablet (50 mg total) by mouth daily.  Dispense: 90 tablet; Refill: 3 -     Zoster  Vac Recomb Adjuvanted; Inject 0.5 ml IM and Repeat in 2 months  Dispense: 0.5 mL; Refill: 1     Patient Instructions  Good to see you today - Thank you for coming in  Things we discussed today:  Consider Shingles I sent a prescription to your pharmacy for your Zoster vaccines to help prevent Shingles.  The shot may cause a sore arm and mild flu like symptoms for a few days.  You will need a second shot 2 months after your first.    Come back to see your new doctor in 6 months  I will miss working with you - Be Well    Carney Living, MD [facility]

## 2023-04-10 ENCOUNTER — Other Ambulatory Visit (HOSPITAL_COMMUNITY): Payer: Self-pay

## 2023-05-28 ENCOUNTER — Other Ambulatory Visit (HOSPITAL_COMMUNITY): Payer: Self-pay

## 2023-07-07 ENCOUNTER — Other Ambulatory Visit (HOSPITAL_COMMUNITY): Payer: Self-pay

## 2023-08-19 ENCOUNTER — Other Ambulatory Visit (HOSPITAL_COMMUNITY): Payer: Self-pay

## 2023-08-19 ENCOUNTER — Other Ambulatory Visit: Payer: Self-pay | Admitting: Family Medicine

## 2023-08-19 DIAGNOSIS — R1084 Generalized abdominal pain: Secondary | ICD-10-CM

## 2023-08-19 MED ORDER — FAMOTIDINE 20 MG PO TABS
20.0000 mg | ORAL_TABLET | Freq: Every evening | ORAL | 1 refills | Status: AC | PRN
  Filled 2023-08-19: qty 30, 30d supply, fill #0
  Filled 2024-04-05: qty 30, 30d supply, fill #1

## 2023-08-19 MED ORDER — CETIRIZINE HCL 10 MG PO TABS
10.0000 mg | ORAL_TABLET | Freq: Every day | ORAL | 2 refills | Status: AC
  Filled 2023-08-19: qty 90, 90d supply, fill #0

## 2023-10-06 ENCOUNTER — Other Ambulatory Visit (HOSPITAL_COMMUNITY): Payer: Self-pay

## 2024-01-04 ENCOUNTER — Other Ambulatory Visit: Payer: Self-pay | Admitting: Family Medicine

## 2024-01-05 ENCOUNTER — Other Ambulatory Visit (HOSPITAL_COMMUNITY): Payer: Self-pay

## 2024-01-05 ENCOUNTER — Other Ambulatory Visit: Payer: Self-pay

## 2024-01-05 MED ORDER — GABAPENTIN 100 MG PO CAPS
200.0000 mg | ORAL_CAPSULE | Freq: Every evening | ORAL | 1 refills | Status: AC | PRN
  Filled 2024-01-05: qty 180, 90d supply, fill #0
  Filled 2024-04-12: qty 180, 90d supply, fill #1

## 2024-03-08 ENCOUNTER — Other Ambulatory Visit (HOSPITAL_COMMUNITY): Payer: Self-pay

## 2024-03-08 ENCOUNTER — Ambulatory Visit: Admitting: Student

## 2024-03-08 VITALS — BP 140/79 | HR 78 | Wt 173.5 lb

## 2024-03-08 DIAGNOSIS — G4733 Obstructive sleep apnea (adult) (pediatric): Secondary | ICD-10-CM

## 2024-03-08 DIAGNOSIS — G44219 Episodic tension-type headache, not intractable: Secondary | ICD-10-CM | POA: Diagnosis not present

## 2024-03-08 DIAGNOSIS — R7303 Prediabetes: Secondary | ICD-10-CM | POA: Diagnosis not present

## 2024-03-08 DIAGNOSIS — R350 Frequency of micturition: Secondary | ICD-10-CM

## 2024-03-08 DIAGNOSIS — N401 Enlarged prostate with lower urinary tract symptoms: Secondary | ICD-10-CM | POA: Diagnosis not present

## 2024-03-08 DIAGNOSIS — I10 Essential (primary) hypertension: Secondary | ICD-10-CM | POA: Diagnosis not present

## 2024-03-08 DIAGNOSIS — Z125 Encounter for screening for malignant neoplasm of prostate: Secondary | ICD-10-CM

## 2024-03-08 DIAGNOSIS — M545 Low back pain, unspecified: Secondary | ICD-10-CM | POA: Diagnosis not present

## 2024-03-08 DIAGNOSIS — G8929 Other chronic pain: Secondary | ICD-10-CM

## 2024-03-08 LAB — POCT GLYCOSYLATED HEMOGLOBIN (HGB A1C): HbA1c, POC (controlled diabetic range): 6.3 % (ref 0.0–7.0)

## 2024-03-08 LAB — POCT URINALYSIS DIP (MANUAL ENTRY)
Bilirubin, UA: NEGATIVE
Blood, UA: NEGATIVE
Glucose, UA: NEGATIVE mg/dL
Ketones, POC UA: NEGATIVE mg/dL
Leukocytes, UA: NEGATIVE
Nitrite, UA: NEGATIVE
Protein Ur, POC: NEGATIVE mg/dL
Spec Grav, UA: 1.015 (ref 1.010–1.025)
Urobilinogen, UA: 0.2 U/dL
pH, UA: 6 (ref 5.0–8.0)

## 2024-03-08 MED ORDER — TAMSULOSIN HCL 0.4 MG PO CAPS
0.4000 mg | ORAL_CAPSULE | Freq: Every day | ORAL | 3 refills | Status: AC
  Filled 2024-03-08: qty 30, 30d supply, fill #0
  Filled 2024-04-05: qty 30, 30d supply, fill #1

## 2024-03-08 NOTE — Assessment & Plan Note (Signed)
-  BMP today - Continue losartan

## 2024-03-08 NOTE — Progress Notes (Unsigned)
    SUBJECTIVE:   CHIEF COMPLAINT / HPI:   Discussed the use of AI scribe software for clinical note transcription with the patient, who gave verbal consent to proceed.  History of Present Illness Brittan S Gervase Colberg is a 63 year old male with chronic headaches and moderate sleep apnea who presents with daily morning headaches.  Headache - Daily morning frontal headaches, bilateral - Headaches present upon awakening - Pain worsened by loud noises - No associated nausea or visual disturbances - Relief achieved by sitting upright and drinking tea - Avoids medication for headache management - Previous head MRI normal - Chronic problem for many years - Degenerative changes at C3 and C5 on prior MRI and x-ray - No radiation of pain to arms or legs  Sleep-disordered breathing - Moderate obstructive sleep apnea diagnosed by prior sleep study, previously more severe before weight loss - Does not use CPAP therapy - Tapes mouth at night as alternative therapy - Loud snoring present  Chronic low back pain - Chronic lumbosacral pain since 2009 - Occasional radiation of pain to rectum and stomach - No prior physical therapy or surgical intervention - Concern for possible association with prostate cancer, but no known family history  Lower urinary tract symptoms - History of enlarged prostate with prior urinary symptoms - Previously treated with Flomax , currently off medication - No urinary retention or significant difficulty urinating   OBJECTIVE:   BP (!) 140/79   Pulse 78   Wt 173 lb 8 oz (78.7 kg)   SpO2 99%   BMI 28.00 kg/m    General: NAD, pleasant Cardio: RRR, no MRG. Cap Refill <2s. Respiratory: CTAB, normal wob on RA Back: No gross forming, no ecchymosis, no swelling.  No TTP.  Hypertonic paraspinal muscles in lumbar region bilaterally.  Straight leg negative.  FADIR/FABER negative. Skin: Warm and dry Neuro: CN II: PERRL CN III, IV,VI: EOMI CVII: Symmetric  smile and brow raise CN VIII: Normal hearing CN IX,X: Symmetric palate raise  CN XI: 5/5 shoulder shrug No overt focal neurologic deficits Alert and oriented x 4  ASSESSMENT/PLAN:   Assessment & Plan Moderate obstructive sleep apnea -Sleep study Prediabetes -A1c Benign prostatic hyperplasia with urinary frequency -Trial Flomax  0.4 mg daily Prostate cancer screening -Patient requesting repeat prostate screening - PSA Chronic left-sided low back pain without sciatica - Trial of physical therapy - Trial conservative measures with OTC analgesics - Consider lumbar imaging if pain does not improve with physical therapy for Red flags develop Essential hypertension -BMP today - Continue losartan  Episodic tension-type headache, not intractable - Likely multifactorial in setting of degenerative cervical spine, sleep apnea.  Despite morning headaches, this is been very chronic for many years without any change in characteristics-low concern for intracranial pressure/pathology as cause.  However if neurologic symptoms develop, or headache characteristics change could consider reimaging head. - Sleep study as above - OTC analgesics - Consider nortriptyline for prophylactic tension headache   Gladis Church, DO [facility]

## 2024-03-08 NOTE — Assessment & Plan Note (Signed)
 A1c

## 2024-03-08 NOTE — Patient Instructions (Addendum)
 It was great to see you! Thank you for allowing me to participate in your care!   I recommend that you always bring your medications to each appointment as this makes it easy to ensure we are on the correct medications and helps us  not miss when refills are needed.  Our plans for today:  - Please take 0.4 mg of Flomax  every day, in the morning to assist with urinary symptoms - You will receive a call from physical therapy to schedule appointment - You will receive a call from sleep medicine to schedule your sleep study - For headaches you can use Tylenol /ibuprofen  over-the-counter - For your back pain you can use lidocaine patches, over-the-counter Tylenol /ibuprofen , ice or heat - We are checking some labs today, I will call you if they are abnormal will send you a MyChart message or a letter if they are normal.  If you do not hear about your labs in the next 2 weeks please let us  know. - FOLLOW-UP in 2 weeks to discuss further concerns.  Take care and seek immediate care sooner if you develop any concerns. Please remember to show up 15 minutes before your scheduled appointment time!  Gladis Church, DO [facility]

## 2024-03-08 NOTE — Assessment & Plan Note (Signed)
-  Trial Flomax  0.4 mg daily

## 2024-03-09 ENCOUNTER — Ambulatory Visit: Payer: Self-pay | Admitting: Student

## 2024-03-09 LAB — BASIC METABOLIC PANEL WITH GFR
BUN/Creatinine Ratio: 9 — ABNORMAL LOW (ref 10–24)
BUN: 9 mg/dL (ref 8–27)
CO2: 25 mmol/L (ref 20–29)
Calcium: 9.5 mg/dL (ref 8.6–10.2)
Chloride: 102 mmol/L (ref 96–106)
Creatinine, Ser: 1.03 mg/dL (ref 0.76–1.27)
Glucose: 91 mg/dL (ref 70–99)
Potassium: 4.9 mmol/L (ref 3.5–5.2)
Sodium: 139 mmol/L (ref 134–144)
eGFR: 82 mL/min/1.73 (ref >59)

## 2024-03-09 LAB — PSA: Prostate Specific Ag, Serum: 0.9 ng/mL (ref 0.0–4.0)

## 2024-04-05 ENCOUNTER — Other Ambulatory Visit: Payer: Self-pay | Admitting: Family Medicine

## 2024-04-05 DIAGNOSIS — E785 Hyperlipidemia, unspecified: Secondary | ICD-10-CM

## 2024-04-06 ENCOUNTER — Other Ambulatory Visit (HOSPITAL_COMMUNITY): Payer: Self-pay

## 2024-04-06 ENCOUNTER — Other Ambulatory Visit: Payer: Self-pay

## 2024-04-06 MED ORDER — ATORVASTATIN CALCIUM 10 MG PO TABS
10.0000 mg | ORAL_TABLET | Freq: Every day | ORAL | 3 refills | Status: AC
  Filled 2024-04-06: qty 90, 90d supply, fill #0

## 2024-04-06 MED ORDER — LOSARTAN POTASSIUM 50 MG PO TABS
50.0000 mg | ORAL_TABLET | Freq: Every day | ORAL | 3 refills | Status: AC
  Filled 2024-04-06: qty 90, 90d supply, fill #0

## 2024-04-12 ENCOUNTER — Other Ambulatory Visit (HOSPITAL_COMMUNITY): Payer: Self-pay
